# Patient Record
Sex: Female | Born: 1940
Health system: Southern US, Community
[De-identification: ages and names within clinical notes are randomized; demographics above are authoritative.]

## PROBLEM LIST (undated history)

## (undated) DIAGNOSIS — F32A Depression, unspecified: Secondary | ICD-10-CM

## (undated) DIAGNOSIS — E785 Hyperlipidemia, unspecified: Secondary | ICD-10-CM

## (undated) DIAGNOSIS — F419 Anxiety disorder, unspecified: Secondary | ICD-10-CM

## (undated) DIAGNOSIS — G250 Essential tremor: Secondary | ICD-10-CM

## (undated) DIAGNOSIS — I4891 Unspecified atrial fibrillation: Secondary | ICD-10-CM

## (undated) DIAGNOSIS — F329 Major depressive disorder, single episode, unspecified: Secondary | ICD-10-CM

## (undated) HISTORY — DX: Essential tremor: G25.0

## (undated) HISTORY — PX: APPENDECTOMY: SHX54

## (undated) HISTORY — DX: Hyperlipidemia, unspecified: E78.5

## (undated) HISTORY — PX: OTHER SURGICAL HISTORY: SHX169

## (undated) HISTORY — PX: TONSILLECTOMY: SUR1361

## (undated) HISTORY — PX: FL INJ LEFT KNEE CT ARTHROGRAM (ARMC HX): HXRAD1307

## (undated) HISTORY — PX: ABDOMINAL HYSTERECTOMY: SHX81

---

## 1997-11-11 ENCOUNTER — Ambulatory Visit (HOSPITAL_COMMUNITY): Admission: RE | Admit: 1997-11-11 | Discharge: 1997-11-11 | Payer: Self-pay | Admitting: *Deleted

## 1998-02-10 ENCOUNTER — Ambulatory Visit (HOSPITAL_COMMUNITY): Admission: RE | Admit: 1998-02-10 | Discharge: 1998-02-10 | Payer: Self-pay | Admitting: Urology

## 1998-07-28 ENCOUNTER — Ambulatory Visit (HOSPITAL_COMMUNITY): Admission: RE | Admit: 1998-07-28 | Discharge: 1998-07-28 | Payer: Self-pay | Admitting: *Deleted

## 1998-08-10 ENCOUNTER — Ambulatory Visit (HOSPITAL_COMMUNITY): Admission: RE | Admit: 1998-08-10 | Discharge: 1998-08-10 | Payer: Self-pay | Admitting: *Deleted

## 1999-04-07 ENCOUNTER — Ambulatory Visit (HOSPITAL_COMMUNITY): Admission: RE | Admit: 1999-04-07 | Discharge: 1999-04-07 | Payer: Self-pay | Admitting: *Deleted

## 1999-08-20 ENCOUNTER — Ambulatory Visit (HOSPITAL_COMMUNITY): Admission: RE | Admit: 1999-08-20 | Discharge: 1999-08-20 | Payer: Self-pay | Admitting: Family Medicine

## 2000-02-24 ENCOUNTER — Encounter: Admission: RE | Admit: 2000-02-24 | Discharge: 2000-02-24 | Payer: Self-pay | Admitting: *Deleted

## 2002-03-29 ENCOUNTER — Emergency Department (HOSPITAL_COMMUNITY): Admission: EM | Admit: 2002-03-29 | Discharge: 2002-03-29 | Payer: Self-pay | Admitting: Emergency Medicine

## 2002-03-29 ENCOUNTER — Encounter: Payer: Self-pay | Admitting: Emergency Medicine

## 2003-02-25 ENCOUNTER — Encounter: Payer: Self-pay | Admitting: Family Medicine

## 2003-02-25 ENCOUNTER — Encounter: Admission: RE | Admit: 2003-02-25 | Discharge: 2003-02-25 | Payer: Self-pay | Admitting: Family Medicine

## 2004-04-04 ENCOUNTER — Ambulatory Visit (HOSPITAL_COMMUNITY): Admission: RE | Admit: 2004-04-04 | Discharge: 2004-04-04 | Payer: Self-pay | Admitting: Family Medicine

## 2004-06-14 ENCOUNTER — Ambulatory Visit: Payer: Self-pay | Admitting: Family Medicine

## 2004-06-15 ENCOUNTER — Encounter: Admission: RE | Admit: 2004-06-15 | Discharge: 2004-06-15 | Payer: Self-pay | Admitting: Family Medicine

## 2004-06-17 ENCOUNTER — Ambulatory Visit: Payer: Self-pay | Admitting: Pulmonary Disease

## 2004-06-17 ENCOUNTER — Ambulatory Visit: Payer: Self-pay | Admitting: Internal Medicine

## 2004-06-17 ENCOUNTER — Inpatient Hospital Stay (HOSPITAL_COMMUNITY): Admission: AD | Admit: 2004-06-17 | Discharge: 2004-06-23 | Payer: Self-pay | Admitting: Family Medicine

## 2004-06-17 ENCOUNTER — Ambulatory Visit: Payer: Self-pay | Admitting: Family Medicine

## 2004-06-21 ENCOUNTER — Ambulatory Visit: Payer: Self-pay | Admitting: Family Medicine

## 2004-06-27 ENCOUNTER — Ambulatory Visit: Payer: Self-pay | Admitting: Family Medicine

## 2004-07-07 ENCOUNTER — Ambulatory Visit: Payer: Self-pay | Admitting: Pulmonary Disease

## 2004-07-20 ENCOUNTER — Emergency Department (HOSPITAL_COMMUNITY): Admission: EM | Admit: 2004-07-20 | Discharge: 2004-07-20 | Payer: Self-pay | Admitting: Emergency Medicine

## 2004-07-21 ENCOUNTER — Ambulatory Visit: Payer: Self-pay | Admitting: Internal Medicine

## 2004-07-21 ENCOUNTER — Ambulatory Visit: Payer: Self-pay

## 2004-07-21 ENCOUNTER — Ambulatory Visit: Payer: Self-pay | Admitting: Family Medicine

## 2004-07-28 ENCOUNTER — Ambulatory Visit: Payer: Self-pay | Admitting: Internal Medicine

## 2004-08-30 ENCOUNTER — Emergency Department (HOSPITAL_COMMUNITY): Admission: EM | Admit: 2004-08-30 | Discharge: 2004-08-30 | Payer: Self-pay | Admitting: *Deleted

## 2004-09-21 ENCOUNTER — Ambulatory Visit: Payer: Self-pay | Admitting: Internal Medicine

## 2004-10-06 ENCOUNTER — Ambulatory Visit: Payer: Self-pay | Admitting: Internal Medicine

## 2004-10-24 ENCOUNTER — Encounter (INDEPENDENT_AMBULATORY_CARE_PROVIDER_SITE_OTHER): Payer: Self-pay | Admitting: Specialist

## 2004-10-24 ENCOUNTER — Ambulatory Visit: Payer: Self-pay | Admitting: Internal Medicine

## 2004-11-24 ENCOUNTER — Ambulatory Visit: Payer: Self-pay | Admitting: Internal Medicine

## 2004-12-14 ENCOUNTER — Ambulatory Visit: Admission: RE | Admit: 2004-12-14 | Discharge: 2004-12-14 | Payer: Self-pay | Admitting: Internal Medicine

## 2004-12-16 ENCOUNTER — Ambulatory Visit: Payer: Self-pay | Admitting: Internal Medicine

## 2004-12-16 ENCOUNTER — Ambulatory Visit (HOSPITAL_COMMUNITY): Admission: RE | Admit: 2004-12-16 | Discharge: 2004-12-16 | Payer: Self-pay | Admitting: Internal Medicine

## 2005-01-17 ENCOUNTER — Ambulatory Visit: Payer: Self-pay | Admitting: Internal Medicine

## 2005-02-21 ENCOUNTER — Ambulatory Visit: Payer: Self-pay | Admitting: Internal Medicine

## 2005-03-15 ENCOUNTER — Ambulatory Visit: Payer: Self-pay | Admitting: Family Medicine

## 2005-03-22 ENCOUNTER — Ambulatory Visit (HOSPITAL_COMMUNITY): Admission: RE | Admit: 2005-03-22 | Discharge: 2005-03-22 | Payer: Self-pay | Admitting: Internal Medicine

## 2005-04-11 ENCOUNTER — Ambulatory Visit: Payer: Self-pay | Admitting: Family Medicine

## 2005-04-25 ENCOUNTER — Ambulatory Visit: Payer: Self-pay | Admitting: Family Medicine

## 2005-05-22 ENCOUNTER — Ambulatory Visit (HOSPITAL_COMMUNITY): Admission: RE | Admit: 2005-05-22 | Discharge: 2005-05-22 | Payer: Self-pay | Admitting: Family Medicine

## 2005-05-23 ENCOUNTER — Ambulatory Visit: Payer: Self-pay | Admitting: Family Medicine

## 2005-06-06 ENCOUNTER — Ambulatory Visit: Payer: Self-pay

## 2005-06-26 ENCOUNTER — Inpatient Hospital Stay (HOSPITAL_COMMUNITY): Admission: EM | Admit: 2005-06-26 | Discharge: 2005-06-28 | Payer: Self-pay | Admitting: Emergency Medicine

## 2005-06-26 ENCOUNTER — Ambulatory Visit: Payer: Self-pay | Admitting: Sports Medicine

## 2005-06-26 ENCOUNTER — Ambulatory Visit: Payer: Self-pay | Admitting: Family Medicine

## 2005-07-03 ENCOUNTER — Ambulatory Visit: Payer: Self-pay | Admitting: Family Medicine

## 2005-08-15 ENCOUNTER — Ambulatory Visit: Payer: Self-pay | Admitting: Family Medicine

## 2005-08-25 ENCOUNTER — Ambulatory Visit: Payer: Self-pay | Admitting: Pulmonary Disease

## 2005-08-29 ENCOUNTER — Ambulatory Visit (HOSPITAL_COMMUNITY): Admission: RE | Admit: 2005-08-29 | Discharge: 2005-08-29 | Payer: Self-pay | Admitting: Pulmonary Disease

## 2005-08-30 ENCOUNTER — Ambulatory Visit (HOSPITAL_COMMUNITY): Admission: RE | Admit: 2005-08-30 | Discharge: 2005-08-30 | Payer: Self-pay | Admitting: Pulmonary Disease

## 2005-09-01 ENCOUNTER — Ambulatory Visit: Payer: Self-pay | Admitting: Family Medicine

## 2005-09-14 ENCOUNTER — Ambulatory Visit (HOSPITAL_COMMUNITY): Admission: RE | Admit: 2005-09-14 | Discharge: 2005-09-14 | Payer: Self-pay | Admitting: Pulmonary Disease

## 2005-09-26 ENCOUNTER — Ambulatory Visit: Payer: Self-pay | Admitting: Pulmonary Disease

## 2005-09-26 ENCOUNTER — Ambulatory Visit: Payer: Self-pay | Admitting: Family Medicine

## 2005-11-10 ENCOUNTER — Observation Stay (HOSPITAL_COMMUNITY): Admission: EM | Admit: 2005-11-10 | Discharge: 2005-11-12 | Payer: Self-pay | Admitting: Emergency Medicine

## 2005-11-10 ENCOUNTER — Ambulatory Visit: Payer: Self-pay | Admitting: Pulmonary Disease

## 2005-11-15 ENCOUNTER — Ambulatory Visit: Payer: Self-pay | Admitting: Pulmonary Disease

## 2005-11-16 ENCOUNTER — Emergency Department (HOSPITAL_COMMUNITY): Admission: EM | Admit: 2005-11-16 | Discharge: 2005-11-16 | Payer: Self-pay | Admitting: *Deleted

## 2005-11-17 ENCOUNTER — Ambulatory Visit: Payer: Self-pay | Admitting: Critical Care Medicine

## 2005-12-05 ENCOUNTER — Ambulatory Visit: Payer: Self-pay | Admitting: Family Medicine

## 2006-01-16 ENCOUNTER — Ambulatory Visit: Payer: Self-pay | Admitting: Pulmonary Disease

## 2006-02-02 ENCOUNTER — Ambulatory Visit: Payer: Self-pay | Admitting: Internal Medicine

## 2006-03-20 DIAGNOSIS — E785 Hyperlipidemia, unspecified: Secondary | ICD-10-CM | POA: Insufficient documentation

## 2006-03-20 DIAGNOSIS — E039 Hypothyroidism, unspecified: Secondary | ICD-10-CM | POA: Insufficient documentation

## 2006-03-20 DIAGNOSIS — Z7901 Long term (current) use of anticoagulants: Secondary | ICD-10-CM

## 2006-03-20 DIAGNOSIS — F339 Major depressive disorder, recurrent, unspecified: Secondary | ICD-10-CM

## 2006-03-20 DIAGNOSIS — F411 Generalized anxiety disorder: Secondary | ICD-10-CM

## 2006-03-20 DIAGNOSIS — E669 Obesity, unspecified: Secondary | ICD-10-CM

## 2006-03-20 HISTORY — DX: Major depressive disorder, recurrent, unspecified: F33.9

## 2006-03-20 HISTORY — DX: Generalized anxiety disorder: F41.1

## 2006-03-20 HISTORY — DX: Obesity, unspecified: E66.9

## 2006-03-20 HISTORY — DX: Long term (current) use of anticoagulants: Z79.01

## 2006-03-29 ENCOUNTER — Ambulatory Visit: Payer: Self-pay | Admitting: Family Medicine

## 2006-03-29 ENCOUNTER — Encounter: Payer: Self-pay | Admitting: Family Medicine

## 2006-05-23 ENCOUNTER — Encounter: Payer: Self-pay | Admitting: Family Medicine

## 2006-05-24 ENCOUNTER — Ambulatory Visit: Payer: Self-pay | Admitting: Family Medicine

## 2006-06-13 LAB — CONVERTED CEMR LAB
Total CHOL/HDL Ratio: 6.1
VLDL: 58 mg/dL — ABNORMAL HIGH (ref 0–40)

## 2006-07-27 ENCOUNTER — Ambulatory Visit: Payer: Self-pay | Admitting: Family Medicine

## 2006-08-10 ENCOUNTER — Ambulatory Visit: Payer: Self-pay | Admitting: Family Medicine

## 2006-09-04 ENCOUNTER — Ambulatory Visit: Payer: Self-pay | Admitting: Family Medicine

## 2006-09-21 ENCOUNTER — Ambulatory Visit: Payer: Self-pay | Admitting: Family Medicine

## 2006-10-12 ENCOUNTER — Encounter: Admission: RE | Admit: 2006-10-12 | Discharge: 2006-10-12 | Payer: Self-pay | Admitting: Family Medicine

## 2006-10-15 ENCOUNTER — Telehealth (INDEPENDENT_AMBULATORY_CARE_PROVIDER_SITE_OTHER): Payer: Self-pay | Admitting: *Deleted

## 2006-10-19 ENCOUNTER — Telehealth: Payer: Self-pay | Admitting: Family Medicine

## 2006-10-25 DIAGNOSIS — M949 Disorder of cartilage, unspecified: Secondary | ICD-10-CM

## 2006-10-25 DIAGNOSIS — M899 Disorder of bone, unspecified: Secondary | ICD-10-CM | POA: Insufficient documentation

## 2006-11-07 ENCOUNTER — Encounter: Payer: Self-pay | Admitting: Family Medicine

## 2007-01-15 ENCOUNTER — Ambulatory Visit: Payer: Self-pay | Admitting: Family Medicine

## 2007-01-15 DIAGNOSIS — L570 Actinic keratosis: Secondary | ICD-10-CM

## 2007-01-15 HISTORY — DX: Actinic keratosis: L57.0

## 2007-01-16 ENCOUNTER — Encounter: Payer: Self-pay | Admitting: Family Medicine

## 2007-01-17 ENCOUNTER — Telehealth: Payer: Self-pay | Admitting: Family Medicine

## 2007-02-13 ENCOUNTER — Encounter: Payer: Self-pay | Admitting: Family Medicine

## 2007-02-13 ENCOUNTER — Telehealth: Payer: Self-pay | Admitting: Family Medicine

## 2007-03-01 ENCOUNTER — Encounter: Payer: Self-pay | Admitting: Family Medicine

## 2007-03-06 ENCOUNTER — Encounter: Payer: Self-pay | Admitting: Family Medicine

## 2007-04-17 ENCOUNTER — Telehealth: Payer: Self-pay | Admitting: Family Medicine

## 2007-04-22 ENCOUNTER — Telehealth: Payer: Self-pay | Admitting: Family Medicine

## 2007-04-30 ENCOUNTER — Encounter: Payer: Self-pay | Admitting: Family Medicine

## 2007-05-01 ENCOUNTER — Ambulatory Visit: Payer: Self-pay | Admitting: Family Medicine

## 2007-05-06 LAB — CONVERTED CEMR LAB
ALT: 20 units/L (ref 0–35)
AST: 15 units/L (ref 0–37)
Albumin: 4.5 g/dL (ref 3.5–5.2)
Alkaline Phosphatase: 84 units/L (ref 39–117)
Calcium: 9.2 mg/dL (ref 8.4–10.5)
Chloride: 108 meq/L (ref 96–112)
Potassium: 4.6 meq/L (ref 3.5–5.3)
Sodium: 143 meq/L (ref 135–145)
Total Protein: 7.3 g/dL (ref 6.0–8.3)

## 2007-05-24 ENCOUNTER — Encounter: Payer: Self-pay | Admitting: Family Medicine

## 2007-05-29 ENCOUNTER — Telehealth: Payer: Self-pay | Admitting: Family Medicine

## 2007-06-03 ENCOUNTER — Telehealth: Payer: Self-pay | Admitting: Family Medicine

## 2007-07-08 ENCOUNTER — Telehealth: Payer: Self-pay | Admitting: Family Medicine

## 2007-07-08 ENCOUNTER — Ambulatory Visit: Payer: Self-pay | Admitting: Family Medicine

## 2007-07-08 DIAGNOSIS — G4700 Insomnia, unspecified: Secondary | ICD-10-CM

## 2007-07-08 HISTORY — DX: Insomnia, unspecified: G47.00

## 2007-07-09 LAB — CONVERTED CEMR LAB
Basophils Absolute: 0 10*3/uL (ref 0.0–0.1)
Basophils Relative: 1 % (ref 0–1)
Eosinophils Absolute: 0.1 10*3/uL (ref 0.0–0.7)
MCHC: 31.9 g/dL (ref 30.0–36.0)
MCV: 92.7 fL (ref 78.0–100.0)
Monocytes Relative: 6 % (ref 3–12)
Neutrophils Relative %: 68 % (ref 43–77)
Platelets: 261 10*3/uL (ref 150–400)
RDW: 14.2 % (ref 11.5–15.5)
Sed Rate: 7 mm/hr (ref 0–22)
TSH: 1.442 microintl units/mL (ref 0.350–5.50)

## 2007-07-26 ENCOUNTER — Telehealth: Payer: Self-pay | Admitting: Family Medicine

## 2007-09-02 ENCOUNTER — Telehealth: Payer: Self-pay | Admitting: Family Medicine

## 2007-10-15 ENCOUNTER — Ambulatory Visit: Payer: Self-pay | Admitting: Family Medicine

## 2007-10-17 ENCOUNTER — Ambulatory Visit: Payer: Self-pay | Admitting: Family Medicine

## 2007-10-17 ENCOUNTER — Encounter: Admission: RE | Admit: 2007-10-17 | Discharge: 2007-10-17 | Payer: Self-pay | Admitting: Family Medicine

## 2007-10-17 LAB — CONVERTED CEMR LAB
Total CHOL/HDL Ratio: 4.5
Vit D, 1,25-Dihydroxy: 25 — ABNORMAL LOW (ref 30–89)

## 2007-10-22 ENCOUNTER — Encounter: Payer: Self-pay | Admitting: Family Medicine

## 2007-11-19 ENCOUNTER — Encounter: Payer: Self-pay | Admitting: Family Medicine

## 2008-03-17 ENCOUNTER — Ambulatory Visit: Payer: Self-pay | Admitting: Family Medicine

## 2008-03-31 ENCOUNTER — Encounter: Payer: Self-pay | Admitting: Family Medicine

## 2008-03-31 ENCOUNTER — Telehealth: Payer: Self-pay | Admitting: Family Medicine

## 2008-04-01 ENCOUNTER — Ambulatory Visit: Payer: Self-pay | Admitting: Family Medicine

## 2008-04-03 ENCOUNTER — Telehealth: Payer: Self-pay | Admitting: Family Medicine

## 2008-04-24 ENCOUNTER — Encounter: Payer: Self-pay | Admitting: Family Medicine

## 2008-04-24 ENCOUNTER — Telehealth: Payer: Self-pay | Admitting: Family Medicine

## 2008-06-01 ENCOUNTER — Telehealth: Payer: Self-pay | Admitting: Family Medicine

## 2008-07-21 ENCOUNTER — Ambulatory Visit: Payer: Self-pay | Admitting: Family Medicine

## 2008-07-21 DIAGNOSIS — G459 Transient cerebral ischemic attack, unspecified: Secondary | ICD-10-CM | POA: Insufficient documentation

## 2008-07-21 DIAGNOSIS — R259 Unspecified abnormal involuntary movements: Secondary | ICD-10-CM | POA: Insufficient documentation

## 2008-07-21 HISTORY — DX: Transient cerebral ischemic attack, unspecified: G45.9

## 2008-07-21 LAB — CONVERTED CEMR LAB
Glucose, Urine, Semiquant: NEGATIVE
Specific Gravity, Urine: 1.025
pH: 5.5

## 2008-07-24 LAB — CONVERTED CEMR LAB
ALT: 12 units/L (ref 0–35)
Basophils Absolute: 0 10*3/uL (ref 0.0–0.1)
CO2: 18 meq/L — ABNORMAL LOW (ref 19–32)
Calcium: 10 mg/dL (ref 8.4–10.5)
Chloride: 106 meq/L (ref 96–112)
Hemoglobin: 15.9 g/dL — ABNORMAL HIGH (ref 12.0–15.0)
Lymphocytes Relative: 24 % (ref 12–46)
Neutro Abs: 5 10*3/uL (ref 1.7–7.7)
Platelets: 286 10*3/uL (ref 150–400)
RDW: 14.2 % (ref 11.5–15.5)
Sodium: 142 meq/L (ref 135–145)
TSH: 2.478 microintl units/mL (ref 0.350–4.50)
Total Protein: 7.5 g/dL (ref 6.0–8.3)

## 2008-07-29 ENCOUNTER — Telehealth: Payer: Self-pay | Admitting: Family Medicine

## 2008-09-18 ENCOUNTER — Ambulatory Visit: Payer: Self-pay | Admitting: Family Medicine

## 2008-09-19 ENCOUNTER — Encounter: Payer: Self-pay | Admitting: Family Medicine

## 2008-10-05 ENCOUNTER — Ambulatory Visit: Payer: Self-pay | Admitting: Family Medicine

## 2008-10-08 ENCOUNTER — Telehealth: Payer: Self-pay | Admitting: Family Medicine

## 2008-10-30 ENCOUNTER — Telehealth: Payer: Self-pay | Admitting: Family Medicine

## 2008-12-03 ENCOUNTER — Encounter: Admission: RE | Admit: 2008-12-03 | Discharge: 2008-12-03 | Payer: Self-pay | Admitting: Family Medicine

## 2008-12-03 ENCOUNTER — Ambulatory Visit: Payer: Self-pay | Admitting: Family Medicine

## 2008-12-09 ENCOUNTER — Telehealth: Payer: Self-pay | Admitting: Family Medicine

## 2009-01-28 ENCOUNTER — Encounter: Payer: Self-pay | Admitting: Family Medicine

## 2009-02-16 ENCOUNTER — Telehealth (INDEPENDENT_AMBULATORY_CARE_PROVIDER_SITE_OTHER): Payer: Self-pay | Admitting: *Deleted

## 2009-03-03 ENCOUNTER — Ambulatory Visit: Payer: Self-pay | Admitting: Family Medicine

## 2009-03-03 ENCOUNTER — Encounter: Admission: RE | Admit: 2009-03-03 | Discharge: 2009-03-03 | Payer: Self-pay | Admitting: Family Medicine

## 2009-03-30 ENCOUNTER — Ambulatory Visit: Payer: Self-pay | Admitting: Family Medicine

## 2009-03-31 LAB — CONVERTED CEMR LAB
Albumin: 4 g/dL (ref 3.5–5.2)
Alkaline Phosphatase: 80 units/L (ref 39–117)
BUN: 17 mg/dL (ref 6–23)
Glucose, Bld: 112 mg/dL — ABNORMAL HIGH (ref 70–99)
HDL: 48 mg/dL (ref >39)
LDL Cholesterol: 133 mg/dL — ABNORMAL HIGH (ref 0–99)
Total Bilirubin: 0.5 mg/dL (ref 0.3–1.2)
Triglycerides: 197 mg/dL — ABNORMAL HIGH (ref <150)
VLDL: 39 mg/dL (ref 0–40)

## 2009-04-23 DIAGNOSIS — E119 Type 2 diabetes mellitus without complications: Secondary | ICD-10-CM

## 2009-04-23 DIAGNOSIS — K219 Gastro-esophageal reflux disease without esophagitis: Secondary | ICD-10-CM

## 2009-04-23 DIAGNOSIS — R0602 Shortness of breath: Secondary | ICD-10-CM

## 2009-04-23 DIAGNOSIS — N259 Disorder resulting from impaired renal tubular function, unspecified: Secondary | ICD-10-CM | POA: Insufficient documentation

## 2009-04-23 HISTORY — DX: Gastro-esophageal reflux disease without esophagitis: K21.9

## 2009-04-23 HISTORY — DX: Shortness of breath: R06.02

## 2009-04-28 ENCOUNTER — Ambulatory Visit: Payer: Self-pay | Admitting: Family Medicine

## 2009-04-28 DIAGNOSIS — N390 Urinary tract infection, site not specified: Secondary | ICD-10-CM

## 2009-04-28 HISTORY — DX: Urinary tract infection, site not specified: N39.0

## 2009-04-28 LAB — CONVERTED CEMR LAB
Blood in Urine, dipstick: NEGATIVE
Glucose, Urine, Semiquant: 100
Nitrite: POSITIVE

## 2010-01-12 ENCOUNTER — Ambulatory Visit: Payer: Self-pay | Admitting: Family Medicine

## 2010-01-13 ENCOUNTER — Encounter: Payer: Self-pay | Admitting: Family Medicine

## 2010-02-15 ENCOUNTER — Telehealth: Payer: Self-pay | Admitting: Family Medicine

## 2010-05-18 ENCOUNTER — Ambulatory Visit: Payer: Self-pay | Admitting: Family Medicine

## 2010-05-18 ENCOUNTER — Encounter: Payer: Self-pay | Admitting: Family Medicine

## 2010-05-19 LAB — CONVERTED CEMR LAB
ALT: 20 U/L
AST: 19 U/L
Albumin: 4.4 g/dL
Alkaline Phosphatase: 77 U/L
BUN: 21 mg/dL
CO2: 24 meq/L
Calcium: 9.1 mg/dL
Chloride: 105 meq/L
Cholesterol: 229 mg/dL — ABNORMAL HIGH
Creatinine, Ser: 1.06 mg/dL
Glucose, Bld: 100 mg/dL — ABNORMAL HIGH
HCT: 44 %
HDL: 47 mg/dL
Hemoglobin: 14.6 g/dL
Hgb A1c MFr Bld: 5.7 % — ABNORMAL HIGH
LDL Cholesterol: 152 mg/dL — ABNORMAL HIGH
MCHC: 33.2 g/dL
MCV: 89.2 fL
Platelets: 253 10*3/uL
Potassium: 4.3 meq/L
RBC: 4.93 M/uL
RDW: 13.9 %
Sodium: 140 meq/L
TSH: 3.792 u[IU]/mL
Total Bilirubin: 0.8 mg/dL
Total CHOL/HDL Ratio: 4.9
Total Protein: 6.8 g/dL
Triglycerides: 152 mg/dL — ABNORMAL HIGH
VLDL: 30 mg/dL
Vit D, 25-Hydroxy: 35 ng/mL
WBC: 5.7 10*3/uL

## 2010-05-20 ENCOUNTER — Encounter (INDEPENDENT_AMBULATORY_CARE_PROVIDER_SITE_OTHER): Payer: Self-pay | Admitting: *Deleted

## 2010-06-27 ENCOUNTER — Ambulatory Visit
Admission: RE | Admit: 2010-06-27 | Discharge: 2010-06-27 | Payer: Self-pay | Source: Home / Self Care | Attending: Family Medicine | Admitting: Family Medicine

## 2010-06-27 ENCOUNTER — Encounter: Payer: Self-pay | Admitting: Family Medicine

## 2010-06-27 DIAGNOSIS — H6690 Otitis media, unspecified, unspecified ear: Secondary | ICD-10-CM | POA: Insufficient documentation

## 2010-06-27 DIAGNOSIS — J209 Acute bronchitis, unspecified: Secondary | ICD-10-CM | POA: Insufficient documentation

## 2010-06-27 HISTORY — DX: Acute bronchitis, unspecified: J20.9

## 2010-07-01 ENCOUNTER — Ambulatory Visit
Admission: RE | Admit: 2010-07-01 | Discharge: 2010-07-01 | Payer: Self-pay | Source: Home / Self Care | Attending: Family Medicine | Admitting: Family Medicine

## 2010-07-01 ENCOUNTER — Encounter
Admission: RE | Admit: 2010-07-01 | Discharge: 2010-07-01 | Payer: Self-pay | Source: Home / Self Care | Attending: Family Medicine | Admitting: Family Medicine

## 2010-07-02 ENCOUNTER — Encounter: Payer: Self-pay | Admitting: Internal Medicine

## 2010-07-03 ENCOUNTER — Encounter: Payer: Self-pay | Admitting: Family Medicine

## 2010-07-13 ENCOUNTER — Ambulatory Visit: Payer: Self-pay | Admitting: Family Medicine

## 2010-07-13 ENCOUNTER — Ambulatory Visit: Admit: 2010-07-13 | Payer: Self-pay | Admitting: Family Medicine

## 2010-07-14 NOTE — Assessment & Plan Note (Signed)
Summary: ear pain   Vital Signs:  Patient profile:   70 year old female Height:      67.5 inches Weight:      210 pounds BMI:     32.52 O2 Sat:      96 % on Room air Temp:     98.5 degrees F oral Pulse rate:   102 / minute BP sitting:   129 / 72  (left arm) Cuff size:   large  Vitals Entered By: Payton Spark CMA (July 01, 2010 2:04 PM)  O2 Flow:  Room air CC: Ears are getting worse.   Primary Care Provider:  Seymour Bars DO  CC:  Ears are getting worse.Marland Kitchen  History of Present Illness: 70 yo WF presents for continued pain in the L ear with muffled hearing.  She has had drainage from the ear and was diagnosed with a perf, on day 3/10 of Ery-tab.  She continues to have a harsh cough.  She is taking ibuprofen 800 mg 3 x a day but it seems to not be helping anymore.  She has had subjective fevers and sweats.  Her cough is unchanged.  She is flying on Monday to [facility] and is worried about her ear pain.  She is not dizzy, is not producing anything w/ her cough, is not SOB but the Hycodan is not helping.    Current Medications (verified): 1)  Xanax 0.5 Mg Tabs (Alprazolam) .Marland Kitchen.. 1 Tab By Mouth Two Times A Day As Needed Anxiety 2)  Fish Oil 1000 Mg Caps (Omega-3 Fatty Acids) .Marland Kitchen.. 1 Capsule By Mouth Tid 3)  Lovastatin 40 Mg Tabs (Lovastatin) .... Take 2 Tabs By Mouth At Bedtime 4)  Mirtazapine 30 Mg Tabs (Mirtazapine) .Marland Kitchen.. 1 Tab By Mouth Qhs 5)  Ery-Tab 250 Mg Tbec (Erythromycin Base) .Marland Kitchen.. 1 Tab By Mouth Q 6 Hrs X 10 Days 6)  Hydrocodone-Homatropine 5-1.5 Mg/2ml Syrp (Hydrocodone-Homatropine) .... 5 Ml By Mouth Q 6 Hrs As Needed Cough  Allergies (verified): 1)  ! Compazine 2)  ! Cephalosporins 3)  ! Pcn  Past History:  Past Medical History: Reviewed history from 04/23/2009 and no changes required. 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 breast biopsies- fibrocystic breast dz,  cardiac ablation,  Cardiac Cath-- nml,  EKG- freq PVCs, coupling, PACs,  hysterectomy for DUB,  L inguinal hernia repair  MRI abd- liver hemanioma, parapelvic renal cysts, stress test- no ischemia, poor exercise tolearance 2005 ([facility]) Tonsillectomy Bronchoscopy, ph probe 11-07  Social History: Reviewed history from 04/28/2009 and no changes required. 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  Has 2 grown children (one adopted) and 7 grandchildren.  Lives alone.  Nonsmoker, denies ETOH, does not exercise.  Review of Systems      See HPI  Physical Exam  General:  alert, well-developed, well-nourished, and well-hydrated.   Head:  normocephalic and atraumatic.   Eyes:  conjunctiva clear Ears:  R TM is normal; L TM no longer appears purulent but has dried blood covering the  bony landmarks, ? perforated Nose:  clear rhinorrhea Mouth:  cobbestoning with clear drip Neck:  no masses.   Lungs:  Normal respiratory effort, chest expands symmetrically. Lungs are clear to auscultation, no crackles or wheezes.  dry hacking cough Heart:  Normal rate and regular rhythm. S1 and S2 normal without gallop, murmur, click, rub or other extra sounds. Neurologic:  tresting head tremor Skin:  color normal.     Impression & Recommendations:  Problem # 1:  LOM (ICD-382.9) She is on Day 3-4/10 on ERY-tab and actually her TM has  improved some.  Wil continue this and treat the pain associated with AOM with Vicodin given upcoming flight.  Recheck when she returns.  Refer to ENT if not resolving. Her updated medication list for this problem includes:    Ery-tab 250 Mg Tbec (Erythromycin base) .Marland Kitchen... 1 tab by mouth q 6 hrs x 10 days  Problem # 2:  ACUTE BRONCHITIS (ICD-466.0) Prolonged harsh cough.  Will get a CXR today to r/o TB/ PNA (she was an ER nurse for years). Will continue current meds if normal.   The following medications were removed from the medication list:    Hydrocodone-homatropine 5-1.5 Mg/16ml Syrp (Hydrocodone-homatropine) .Marland KitchenMarland KitchenMarland KitchenMarland Kitchen 5 ml by mouth q 6 hrs as needed cough Her updated medication list for this problem includes:    Ery-tab 250 Mg Tbec (Erythromycin base) .Marland Kitchen... 1 tab by mouth q 6 hrs x 10 days  Orders: T-Chest x-ray, 2 views (71020)  Complete Medication List: 1)  Xanax 0.5 Mg Tabs (Alprazolam) .Marland Kitchen.. 1 tab by mouth two times a day as needed anxiety 2)  Fish Oil 1000 Mg Caps (Omega-3 fatty acids) .Marland Kitchen.. 1 capsule by mouth tid 3)  Lovastatin 40 Mg Tabs (Lovastatin) .... Take 2 tabs by mouth at bedtime 4)  Mirtazapine 30 Mg Tabs (Mirtazapine) .Marland Kitchen.. 1 tab by mouth qhs 5)  Ery-tab 250 Mg Tbec (Erythromycin base) .Marland Kitchen.. 1 tab by mouth q 6 hrs x 10 days 6)  Hydrocodone-acetaminophen 5-500 Mg Tabs (Hydrocodone-acetaminophen) .Marland Kitchen.. 1-2 tabs by mouth q 6 hrs as needed pain/ cough 7)  Prilosec Otc 20 Mg Tbec (Omeprazole magnesium) .... 2 tabs by mouth once daily  Patient Instructions: 1)  CXR today. 2)  Will call you tonight with results. 3)  Stay on ERY-TAB. 4)  Change out Hycodan for RX Hydrocodone tabs -- this will help both cough and ear pain. Prescriptions: HYDROCODONE-ACETAMINOPHEN 5-500 MG TABS (HYDROCODONE-ACETAMINOPHEN) 1-2 tabs by mouth q 6 hrs as needed pain/ cough  #30 x 0   Entered and Authorized by:   Seymour Bars DO   Signed by:   Seymour Bars DO on 07/01/2010   Method used:   Printed then faxed  to ...       [facility] [facility] (retail)       [address]       [place], [place]  [address]       Ph: [phone]       Fax: [phone]   RxID:   [id number]    Orders Added: 1)  T-Chest x-ray, 2 views [71020] 2)  Est. Patient Level III [84132]  Appended Document: ear pain Pls have pt take OTC Prilosec 2 tabs once daily until I see her back.  Her reflux is likely to be contributing to this cough.  Seymour Bars, D.O.  Appended Document: ear pain LMOM informing Pt of the above

## 2010-07-14 NOTE — Letter (Signed)
Summary: Primary Care Consult Scheduled Letter  [facility] [place]  [address]   [place], [place] [address]   Phone: [phone]  Fax: [phone]      05/20/2010 MRN: [id number]  [name] Brown [address] [place], [place]  [address]    Dear Ms. Halpin,     We are unable to reach you by phone because we need a  updated phone number for you.  We have scheduled an appointment for you.  At the recommendation of Dr.Bowen, we have scheduled you a consult with [facility] on Thursday 06/16/10 at 10:15.  Their address is [address] [place] [address]. The office phone number is [phone].  If this appointment day and time is not convenient for you, please feel free to call the office of the doctor you are being referred to at the number listed above and reschedule the appointment.     It is important for you to keep your scheduled appointments. We are here to make sure you are given good patient care.    Thank you, Michaelle Copas [phone] Patient Care Coordinator [facility] Kathryne Sharper

## 2010-07-14 NOTE — Assessment & Plan Note (Signed)
Summary: UTI   Vital Signs:  Patient profile:   70 year old female Height:      67.5 inches Weight:      216 pounds BMI:     33.45 O2 Sat:      96 % on Room air Temp:     98.4 degrees F oral Pulse rate:   77 / minute BP sitting:   136 / 77  (left arm) Cuff size:   large  Vitals Entered By: Payton Spark CMA (January 12, 2010 8:53 AM)  O2 Flow:  Room air CC: ? UTI.   Primary Care Provider:  Seymour Bars DO  CC:  ? UTI.Marland Kitchen  History of Present Illness: [name] Brown presents for UTI symptoms that started on Saturday.  She was out of town, so only AZO OTC.  She is having HAs, tired, dysuria, frequency, urgency.  Denies fevers or chills.  Has some suprabic pain.  Denies LBP or flank pain.  Has some nausea, no vomitting.  Denies any gross hematuria.  AZO did help some.    Allergies: 1)  ! Compazine 2)  ! Cephalosporins 3)  ! Pcn  Past History:  Past Medical History: Reviewed history from 04/23/2009 and no changes required. Current Problems:  TRANSIENT ISCHEMIC ATTACK (ICD-435.9) HYPERLIPIDEMIA (ICD-272.4) GERD (ICD-530.81) DM (ICD-250.00) RENAL INSUFFICIENCY (ICD-588.9) DYSPNEA (ICD-786.05) HEALTH MAINTENANCE EXAM (ICD-V70.0) RESTING TREMOR (ICD-781.0) INSOMNIA, CHRONIC (ICD-307.42) ACTINIC KERATOSIS (ICD-702.0) OSTEOPENIA (ICD-733.90) OBESITY, NOS (ICD-278.00) HYPOTHYROIDISM, UNSPECIFIED (ICD-244.9) DEPRESSION, MAJOR, RECURRENT (ICD-296.30) ANXIETY (ICD-300.00) cardia arrythmia,  accessory tract,  G1P1001 Left calf path: SCC in situ  resting tremors - head L vocal cord dysfunction ?TIA -- seeing Dr Gaetano Net  Social History: Reviewed history from 04/28/2009 and no changes required. 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  Has 2 grown children (one adopted) and 7 grandchildren.  Lives alone.  Nonsmoker, denies ETOH, does not exercise.  Review of Systems      See HPI  Physical Exam  General:  alert, well-developed, well-nourished,  well-hydrated, and overweight-appearing.   Head:  normocephalic and atraumatic.   Eyes:  sclera non icteric Mouth:  pharynx pink and moist.   Neck:  no masses.   Lungs:  Normal respiratory effort, chest expands symmetrically. Lungs are clear to auscultation, no crackles or wheezes. Heart:  Normal rate and regular rhythm. S1 and S2 normal without gallop, murmur, click, rub or other extra sounds. Abdomen:  suprapbuic TTP, no CVAT, soft. Extremities:  trace bilat LE edema Skin:  color normal.   Psych:  good eye contact, not anxious appearing, and not depressed appearing.     Impression & Recommendations:  Problem # 1:  UTI (ICD-599.0) UA grossly + for infection.  Sent for cx to make sure it's not resistant to Cipro.  Start Cipro + Pyridium.  Increase water intake.  Bland diet, rest.   The following medications were removed from the medication list:    Ciprofloxacin Hcl 500 Mg Tabs (Ciprofloxacin hcl) .Marland Kitchen... 1 tab by mouth q 12 hrs x 3 days Her updated medication list for this problem includes:    Pyridium 200 Mg Tabs (Phenazopyridine hcl) .Marland Kitchen... 1 tab by mouth three times a day x 2 days    Ciprofloxacin Hcl 500 Mg Tabs (Ciprofloxacin hcl) .Marland Kitchen... 1 tab by mouth q 12 hrs x 3 days  Orders: UA Dipstick w/o Micro (automated)  (81003) T-Culture, Urine ([address])  Complete Medication List: 1)  Calcarb 600/d 600-125 Mg-unit Tabs (Calcium-vitamin d) .Marland KitchenMarland KitchenMarland Kitchen  1 tab by mouth two times a day ac 2)  Inderal 40 Mg Tabs (Propranolol hcl) .Marland Kitchen.. 1 tab by mouth once daily 3)  Synthroid 100 Mcg Tabs (Levothyroxine sodium) .Marland Kitchen.. 1 tab by mouth qd 4)  Xanax 0.5 Mg Tabs (Alprazolam) .Marland Kitchen.. 1 tab by mouth two times a day as needed anxiety 5)  Fish Oil 1000 Mg Caps (Omega-3 fatty acids) .Marland Kitchen.. 1 capsule by mouth tid 6)  Lovastatin 40 Mg Tabs (Lovastatin) .... 2 tabs by mouth at bedtime 7)  Vitamin D 50,000 Iu Capsules  .... Take 1 capsule by mouth q week 8)  Cymbalta 60 Mg Cpep (Duloxetine hcl) .Marland Kitchen.. 1 tab by mouth  daily 9)  Mirtazapine 30 Mg Tabs (Mirtazapine) .Marland Kitchen.. 1 tab by mouth qhs 10)  Ipratropium Bromide 0.03 % Soln (Ipratropium bromide) .... 2 sprays per nostril bid 11)  Pyridium 200 Mg Tabs (Phenazopyridine hcl) .Marland Kitchen.. 1 tab by mouth three times a day x 2 days 12)  Ciprofloxacin Hcl 500 Mg Tabs (Ciprofloxacin hcl) .Marland Kitchen.. 1 tab by mouth q 12 hrs x 3 days  Patient Instructions: 1)  Start on Cipro every 12 hrs for UTI. 2)  Will call you with urine culture results on Friday.   3)  Drink plenty of water. 4)  Take it easy. Prescriptions: PYRIDIUM 200 MG TABS (PHENAZOPYRIDINE HCL) 1 tab by mouth three times a day x 2 days  #6 tabs x 0   Entered and Authorized by:   Seymour Bars DO   Signed by:   Seymour Bars DO on 01/12/2010   Method used:   Electronically to        [facility]  [address] [id number] (retail)       [address] [place], [place]  [address]       Ph: [phone] or [phone]       Fax: [phone]   RxID:   [id number] CIPROFLOXACIN HCL 500 MG TABS (CIPROFLOXACIN HCL) 1 tab by mouth q 12 hrs x 3 days  #6 x 0   Entered and Authorized by:   Seymour Bars DO   Signed by:   Seymour Bars DO on 01/12/2010   Method used:   Electronically to        [facility]  [address] [id number] ([facility])       [address] [place], [place]  [address]       Ph: [phone] or [phone]       Fax: [phone]   RxID:   [id number]   Appended Document: UTI     Vitals Entered By: Payton Spark CMA (January 12, 2010 9:26 AM)  Allergies: 1)  ! Compazine 2)  ! Cephalosporins 3)  ! Pcn   Complete Medication List: 1)  Calcarb 600/d 600-125 Mg-unit Tabs (Calcium-vitamin d) .Marland Kitchen.. 1 tab by mouth two times a day ac 2)  Inderal 40 Mg Tabs (Propranolol hcl) .Marland Kitchen.. 1 tab by mouth once daily 3)  Synthroid 100 Mcg Tabs (Levothyroxine sodium) .Marland Kitchen.. 1 tab by mouth qd 4)  Xanax 0.5 Mg Tabs (Alprazolam) .Marland Kitchen.. 1 tab by mouth two times a day as needed anxiety 5)  Fish Oil 1000 Mg Caps (Omega-3  fatty acids) .Marland Kitchen.. 1 capsule by mouth tid 6)  Lovastatin 40 Mg Tabs (Lovastatin) .... 2 tabs by mouth at bedtime 7)  Vitamin D 50,000  Iu Capsules  .... Take 1 capsule by mouth q week 8)  Cymbalta 60 Mg Cpep (Duloxetine hcl) .Marland Kitchen.. 1 tab by mouth daily 9)  Mirtazapine 30 Mg Tabs (Mirtazapine) .Marland Kitchen.. 1 tab by mouth qhs 10)  Ipratropium Bromide 0.03 % Soln (Ipratropium bromide) .... 2 sprays per nostril bid 11)  Pyridium 200 Mg Tabs (Phenazopyridine hcl) .Marland Kitchen.. 1 tab by mouth three times a day x 2 days 12)  Ciprofloxacin Hcl 500 Mg Tabs (Ciprofloxacin hcl) .Marland Kitchen.. 1 tab by mouth q 12 hrs x 3 days   Laboratory Results   Urine Tests    Routine Urinalysis   Color: yellow Appearance: Clear Glucose: negative   (Normal Range: Negative) Bilirubin: small   (Normal Range: Negative) Ketone: negative   (Normal Range: Negative) Spec. Gravity: >=1.030   (Normal Range: 1.003-1.035) Blood: negative   (Normal Range: Negative) pH: 5.0   (Normal Range: 5.0-8.0) Protein: trace   (Normal Range: Negative) Urobilinogen: 0.2   (Normal Range: 0-1) Nitrite: positive   (Normal Range: Negative) Leukocyte Esterace: small   (Normal Range: Negative)

## 2010-07-14 NOTE — Progress Notes (Signed)
Summary: Xanax refill  Phone Note Refill Request   Refills Requested: Medication #1:  XANAX 0.5 MG TABS 1 tab by mouth two times a day as needed anxiety Initial call taken by: Payton Spark CMA,  February 15, 2010 1:22 PM    Prescriptions: XANAX 0.5 MG TABS (ALPRAZOLAM) 1 tab by mouth two times a day as needed anxiety  #180 x 0   Entered and Authorized by:   Seymour Bars DO   Signed by:   Seymour Bars DO on 02/15/2010   Method used:   Printed then faxed to ...       [facility]  [address] [phone][facility] (retail[facility]       [address]       [place], [place]  [address]       Ph: [phone] or [phone]       Fax: [phone]   RxID:   [id number]

## 2010-07-14 NOTE — Assessment & Plan Note (Signed)
Summary: CPE w/o pap   Vital Signs:  Patient profile:   70 year old female Height:      67.5 inches Weight:      212 pounds BMI:     32.83 O2 Sat:      96 % on Room air Pulse rate:   83 / minute BP sitting:   127 / 77  (left arm) Cuff size:   large  Vitals Entered By: Payton Spark CMA (May 18, 2010 8:26 AM)  O2 Flow:  Room air CC: CPE w/ fasting labs   Primary Care Maveric Debono:  Seymour Bars DO  CC:  CPE w/ fasting labs.  History of Present Illness: 70 yo WF presents for CPE w/o pap. She is s/p TAH for DUB.  Denies any vag dishcarge, pelvic pain or bleeding.    She weaned herself off Cymbalta and Inderal 8 mos ago and is doing well.  Weight has been stable.  Trying to eat healthier but not exercising.  Busy helping raise her 4 grandkids after the death of her daughter in law to cancer earlier this year.  She denies CP, DOE.  Due to see dentist and dermatologist.  Had a colonoscopy in 06, normal.  Tetanus is UTD.  Due for flu shot, fasting labs, mammogram and DEXA.  Denies fam hx of premature heart dz.    Current Medications (verified): 1)  Calcarb 600/d 600-125 Mg-Unit Tabs (Calcium-Vitamin D) .Marland Kitchen.. 1 Tab By Mouth Two Times A Day Ac 2)  Synthroid 100 Mcg Tabs (Levothyroxine Sodium) .Marland Kitchen.. 1 Tab By Mouth Qd 3)  Xanax 0.5 Mg Tabs (Alprazolam) .Marland Kitchen.. 1 Tab By Mouth Two Times A Day As Needed Anxiety 4)  Fish Oil 1000 Mg Caps (Omega-3 Fatty Acids) .Marland Kitchen.. 1 Capsule By Mouth Tid 5)  Lovastatin 40 Mg  Tabs (Lovastatin) .... 2 Tabs By Mouth At Bedtime 6)  Vitamin D 50,000 Iu Capsules .... Take 1 Capsule By Mouth Q Week 7)  Mirtazapine 30 Mg Tabs (Mirtazapine) .Marland Kitchen.. 1 Tab By Mouth Qhs  Allergies (verified): 1)  ! Compazine 2)  ! Cephalosporins 3)  ! Pcn  Past History:  Past Medical History: Reviewed history from 04/23/2009 and no changes required. 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 breast biopsies- fibrocystic breast dz,  cardiac ablation,  Cardiac Cath-- nml,  EKG- freq PVCs, coupling, PACs,  hysterectomy for DUB,  L inguinal hernia repair  MRI abd- liver hemanioma, parapelvic renal cysts, stress test- no ischemia, poor exercise tolearance 2005 ([facility]) Tonsillectomy Bronchoscopy, ph probe 11-07  Family History: Reviewed history from 09/18/2008 and no changes required. father- lung cancer, afib, mother- HTN, died at 5  sister- HTN  Social History: Reviewed history from 04/28/2009 and no changes required. 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  Has 2 grown children (one adopted) and 7 grandchildren.  Lives alone.  Nonsmoker, denies ETOH, does not exercise.  Review of Systems  The patient denies anorexia, fever, weight loss, weight gain, vision loss, decreased hearing, hoarseness, chest pain,  syncope, dyspnea on exertion, peripheral edema, prolonged cough, headaches, hemoptysis, abdominal pain, melena, hematochezia, severe indigestion/heartburn, hematuria, incontinence, genital sores, muscle weakness, suspicious skin lesions, transient blindness, difficulty walking, depression, unusual weight change, abnormal bleeding, enlarged lymph nodes, angioedema, breast masses, and testicular masses.    Physical Exam  General:  alert, well-developed, well-nourished, and well-hydrated.    Head:  normocephalic and atraumatic.   Eyes:  pupils equal, pupils round, and pupils reactive to light.  anterior chamber clouding, wears glasses Ears:  EACs patent; TMs translucent and gray with good cone of light and bony landmarks.  Nose:  no nasal discharge.   Mouth:  pharynx pink and moist and fair dentition.   Neck:  supple and no masses.  resting neck tremor, unchanged. no audible carotid bruits Breasts:  No mass, nodules, thickening, tenderness, bulging, retraction, inflamation, nipple discharge or skin changes noted.   Lungs:  Normal respiratory effort, chest expands symmetrically. Lungs are clear to auscultation, no crackles or wheezes. Heart:  Normal rate and regular rhythm. S1 and S2 normal without gallop, murmur, click, rub or other extra sounds. Abdomen:  Bowel sounds positive,abdomen soft and non-tender without masses, organomegaly or hernias noted. Msk:  no joint effusions Pulses:  2+ radial and pedal pulses Extremities:  trace LE edema Skin:  fair skin with mulitple pink scaley macules on chest and face Cervical Nodes:  No lymphadenopathy noted Psych:  good eye contact, not anxious appearing, and not depressed appearing.     Impression & Recommendations:  Problem # 1:  HEALTH MAINTENANCE EXAM (ICD-V70.0) Keeping healthy checklist for women reviewed. BP at goal.  BMI 32 c/w class I obesity. Tdap UTD.  PNX UTD.  Flu shot today. Udpate fasting labs.  Update mammogram and DEXA. Derm referral made for [facility]. RTC in 6 mos.  Complete Medication List: 1)  Calcarb 600/d 600-125 Mg-unit Tabs (Calcium-vitamin d) .Marland Kitchen.. 1 tab by mouth two times a day ac 2)  Synthroid 100 Mcg Tabs (Levothyroxine sodium) .Marland Kitchen.. 1 tab by mouth qd 3)  Xanax 0.5 Mg Tabs (Alprazolam) .Marland Kitchen.. 1 tab by mouth two times a day as needed anxiety 4)  Fish Oil 1000 Mg Caps (Omega-3 fatty acids) .Marland Kitchen.. 1 capsule by mouth tid 5)  Lovastatin 40 Mg Tabs (Lovastatin) .... 2 tabs by mouth at bedtime 6)  Vitamin D 50,000  Iu Capsules  .... Take 1 capsule by mouth q week 7)  Mirtazapine 30 Mg Tabs (Mirtazapine) .Marland Kitchen.. 1 tab by mouth qhs  Other Orders: Flu Vaccine 82yrs + MEDICARE PATIENTS (J4782) Administration Flu vaccine - MCR (N5621) Dermatology Referral (Derm[facility]) T-CBC No Diff ([id number]) T-Comprehensive Metabolic Panel [phone]) T-Lipid Profile [phone]) T-TSH [phone]) T-Vitamin D (25-Hydroxy) [phone]) T-Hemoglobin A1C (33295) T-Mammography Bilateral Screening (18841) T-DXA Bone Density/ Appendicular (66063) T-Dual DXA Bone Density/ Axial (01601)  Patient Instructions: 1)  Fasting labs today. 2)  Will call you w/ results tomorrow. 3)  Update mammogram and DEXA downstairs. 4)  Will get you back in with [facility]. 5)  Plan to update your stress test in the next year. 6)  REturn for f/u thyroid in 6 mos. Flu Vaccine Consent Questions     Do you have a history of severe allergic reactions to this vaccine? no    Any prior history of allergic reactions to egg and/or gelatin? no    Do you have a sensitivity to the preservative Thimersol? no    Do you have a past history of Guillan-Barre Syndrome? no    Do you currently  have an acute febrile illness? no    Have you ever had a severe reaction to latex? no    Vaccine information given and explained to patient? yes    Are you currently pregnant? no    Lot Number:[id number]   Exp Date:12/10/2010   Site Given  Left Deltoid IM  Orders Added: 1)  Flu Vaccine 28yrs + MEDICARE PATIENTS [Q2039] 2)  Administration Flu vaccine - MCR [G0008] 3)  Dermatology Referral [Derma] 4)  T-CBC No Diff [85027-10000] 5)  T-Comprehensive Metabolic Panel [80053-22900] 6)  T-Lipid Profile [80061-22930] 7)  T-TSH [21308-65784] 8)  T-Vitamin D (25-Hydroxy) [69629-52841] 9)  T-Hemoglobin A1C [23375] 10)  T-Mammography Bilateral Screening [77057] 11)  T-DXA Bone Density/ Appendicular [77081] 12)  T-Dual DXA Bone Density/ Axial [77080] 13)  Est.  Patient age 63&> [32440]     .lbmedflu

## 2010-07-14 NOTE — Assessment & Plan Note (Signed)
Summary: AOM/ bronchitis   Vital Signs:  Patient profile:   70 year old female Height:      67.5 inches Weight:      211 pounds BMI:     32.68 O2 Sat:      97 % on Room air Temp:     98.3 degrees F oral Pulse rate:   87 / minute BP sitting:   158 / 82  (left arm) Cuff size:   large  Vitals Entered By: Payton Spark CMA (June 27, 2010 4:03 PM)  O2 Flow:  Room air CC: Cough x 5 weeks. Also c/o L ear pain and ? burst ear drum.   Primary Care Provider:  Seymour Bars DO  CC:  Cough x 5 weeks. Also c/o L ear pain and ? burst ear drum.Marland Kitchen  History of Present Illness: 70 yo WF presents for a cough that started 5 wks ago.  She started taking Mucinex DM and got better but is back and is worse.  Her cough is productive.  No SOB.  No chest tightness but has pain from coughing.  Has a HA.  Has chills but no fevers.  The cough is keeeping her up at night.  No Sore throat, runny nose or postnasal drip.  Has ear fullness with L sided pain, hearing loss and purulent, bleeding x 2 days.  No GI uspet.      Allergies (verified): 1)  ! Compazine 2)  ! Cephalosporins 3)  ! Pcn  Past History:  Past Medical History: Reviewed history from 04/23/2009 and no changes required. Current Problems:  TRANSIENT ISCHEMIC ATTACK (ICD-435.9) HYPERLIPIDEMIA (ICD-272.4) GERD (ICD-530.81) DM (ICD-250.00) RENAL INSUFFICIENCY (ICD-588.9) DYSPNEA (ICD-786.05) HEALTH MAINTENANCE EXAM (ICD-V70.0) RESTING TREMOR (ICD-781.0) INSOMNIA, CHRONIC (ICD-307.42) ACTINIC KERATOSIS (ICD-702.0) OSTEOPENIA (ICD-733.90) OBESITY, NOS (ICD-278.00) HYPOTHYROIDISM, UNSPECIFIED (ICD-244.9) DEPRESSION, MAJOR, RECURRENT (ICD-296.30) ANXIETY (ICD-300.00) cardia arrythmia,  accessory tract,  G1P1001 Left calf path: SCC in situ  resting tremors - head L vocal cord dysfunction ?TIA -- seeing Dr Gaetano Net  Past Surgical History: Reviewed history from 09/21/2006 and no changes required. breast biopsies- fibrocystic breast  dz,  cardiac ablation,  Cardiac Cath-- nml,  EKG- freq PVCs, coupling, PACs,  hysterectomy for DUB,  L inguinal hernia repair  MRI abd- liver hemanioma, parapelvic renal cysts, stress test- no ischemia, poor exercise tolearance 2005 ([facility]) Tonsillectomy Bronchoscopy, ph probe 11-07  Social History: Reviewed history from 04/28/2009 and no changes required. 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  Has 2 grown children (one adopted) and 7 grandchildren.  Lives alone.  Nonsmoker, denies ETOH, does not exercise.  Review of Systems      See HPI  Physical Exam  General:  alert, well-developed, well-nourished, well-hydrated, and overweight-appearing.   Head:  normocephalic and atraumatic.  sinuses NTTP Eyes:  conjunctiva clear Ears:  R TM normal other than purulence inferiroly; L EAC filled with scant purulent, blood discharge, poor bony landmarks, perforated TM Nose:  sant rhinorrhea Mouth:  pharynx pink and moist.  clear postnasal drip Neck:  no masses.   Lungs:  dry hacking cough, nonlabored, scattered rhonchi, no wheezing Heart:  Normal rate and regular rhythm. S1 and S2 normal without gallop, murmur, click, rub or other extra sounds. Skin:  color normal.   Cervical Nodes:  No lymphadenopathy noted   Impression & Recommendations:  Problem # 1:  LOM (ICD-382.9) Will treated L>R AOM with perforation with 10 days of Ery-Tab.  Recheck in 10 days. Her updated medication  list for this problem includes:    Ery-tab 250 Mg Tbec (Erythromycin base) .Marland Kitchen... 1 tab by mouth q 6 hrs x 10 days  Problem # 2:  ACUTE BRONCHITIS (ICD-466.0) Treat with Ery-Tab, Mucinex and RX Hycodan for cough in addition to supportive care measures.  Call if any SOB or chest tightness occurs or if not starting to improve by the end of the wk.  Recheck in 10 days. Her updated medication list for this problem includes:    Ery-tab 250 Mg Tbec (Erythromycin base) .Marland Kitchen... 1 tab by mouth q 6  hrs x 10 days    Hydrocodone-homatropine 5-1.5 Mg/39ml Syrp (Hydrocodone-homatropine) .Marland KitchenMarland KitchenMarland KitchenMarland Kitchen 5 ml by mouth q 6 hrs as needed cough  Complete Medication List: 1)  Xanax 0.5 Mg Tabs (Alprazolam) .Marland Kitchen.. 1 tab by mouth two times a day as needed anxiety 2)  Fish Oil 1000 Mg Caps (Omega-3 fatty acids) .Marland Kitchen.. 1 capsule by mouth tid 3)  Lovastatin 40 Mg Tabs (Lovastatin) .... Take 2 tabs by mouth at bedtime 4)  Mirtazapine 30 Mg Tabs (Mirtazapine) .Marland Kitchen.. 1 tab by mouth qhs 5)  Ery-tab 250 Mg Tbec (Erythromycin base) .Marland Kitchen.. 1 tab by mouth q 6 hrs x 10 days 6)  Hydrocodone-homatropine 5-1.5 Mg/63ml Syrp (Hydrocodone-homatropine) .... 5 ml by mouth q 6 hrs as needed cough  Patient Instructions: 1)  Take PLAIN MUCINEX every 12 hrs + RX cough syrup as needed (up to 4 x a day), caution sedating and constipating side effects. 2)  Take 10 days of ERY-TAB for ear infection/ bronchitis. 3)  HOLD LOVASTATIN WHILE ON THIS. 4)  REturn for f/u in 10 days. 5)  Call if any problems. Prescriptions: HYDROCODONE-HOMATROPINE 5-1.5 MG/5ML SYRP (HYDROCODONE-HOMATROPINE) 5 ml by mouth q 6 hrs as needed cough  #200 ml x 0   Entered and Authorized by:   Seymour Bars DO   Signed by:   Seymour Bars DO on 06/27/2010   Method used:   Printed then faxed to ...       [facility]  [address] [address] [facility] (retail)       [address]       [place], [place]  [address]       Ph: [phone]       Fax: [phone]   RxID:   [id number] ERY-TAB 250 MG TBEC (ERYTHROMYCIN BASE) 1 tab by mouth q 6 hrs x 10 days  #40 x 0   Entered and Authorized by:   Seymour Bars DO   Signed by:   Seymour Bars DO on 06/27/2010   Method used:   Electronically to        [facility]  [address] (retail)       [address]       [place], [place]  [address]       Ph: [phone]       Fax: [phone]   RxID:   [id number]    Orders Added: 1)  Est. Patient Level III [60109]

## 2010-07-19 ENCOUNTER — Ambulatory Visit: Payer: Self-pay | Admitting: Family Medicine

## 2010-07-20 NOTE — Therapy (Signed)
Summary: Audiometry/[place] [facility]  Audiometry/[facility]   Imported By: Lanelle Bal 07/15/2010 09:13:45  _____________________________________________________________________  External Attachment:    Type:   Image     Comment:   External Document

## 2010-08-10 ENCOUNTER — Other Ambulatory Visit: Payer: Self-pay | Admitting: Family Medicine

## 2010-08-10 DIAGNOSIS — Z139 Encounter for screening, unspecified: Secondary | ICD-10-CM

## 2010-08-12 ENCOUNTER — Other Ambulatory Visit: Payer: Self-pay | Admitting: Family Medicine

## 2010-08-12 DIAGNOSIS — M8580 Other specified disorders of bone density and structure, unspecified site: Secondary | ICD-10-CM

## 2010-08-15 ENCOUNTER — Ambulatory Visit (HOSPITAL_COMMUNITY)
Admission: RE | Admit: 2010-08-15 | Discharge: 2010-08-15 | Disposition: A | Discharge status: Home / Self Care | Payer: Medicare Other | Source: Ambulatory Visit | Attending: Family Medicine | Admitting: Family Medicine

## 2010-08-15 DIAGNOSIS — M949 Disorder of cartilage, unspecified: Secondary | ICD-10-CM | POA: Insufficient documentation

## 2010-08-15 DIAGNOSIS — Z139 Encounter for screening, unspecified: Secondary | ICD-10-CM

## 2010-08-15 DIAGNOSIS — M8580 Other specified disorders of bone density and structure, unspecified site: Secondary | ICD-10-CM

## 2010-08-15 DIAGNOSIS — M899 Disorder of bone, unspecified: Secondary | ICD-10-CM | POA: Insufficient documentation

## 2010-08-15 DIAGNOSIS — Z1231 Encounter for screening mammogram for malignant neoplasm of breast: Secondary | ICD-10-CM | POA: Insufficient documentation

## 2010-09-02 ENCOUNTER — Telehealth: Payer: Self-pay | Admitting: Family Medicine

## 2010-09-02 NOTE — Telephone Encounter (Signed)
Please call pt and let her know that her DEXA scan shows osteopenia.  Continue calcium + Vitamin D daily and repeat in 2 yrs.

## 2010-09-02 NOTE — Telephone Encounter (Signed)
LMOM informing Pt of the above 

## 2010-09-14 ENCOUNTER — Encounter: Payer: Self-pay | Admitting: Family Medicine

## 2010-10-28 NOTE — Discharge Summary (Signed)
[name], [name]               ACCOUNT NO.:  [id number]   MEDICAL RECORD NO.:  [id number]          PATIENT TYPE:  INP   LOCATION:  6525                         FACILITY:  [facility]   PHYSICIAN:  Melina Fiddler, MD DATE OF BIRTH:  1940-09-07   DATE OF ADMISSION:  06/26/2005  DATE OF DISCHARGE:  06/28/2005                                 DISCHARGE SUMMARY   PRIMARY CARE PHYSICIAN:  Dr. Trinna Post   CONSULTING PHYSICIAN:  None.   FINAL DIAGNOSES:  1.  Lower respiratory tract infection.  2.  Depression.  3.  Tremor.  4.  Anxiety.  5.  Hyperlipidemia.  6.  Gastroesophageal reflux disease.  7.  Hypothyroidism.   PRINCIPAL PROCEDURE:  None.   LABORATORY DATA:  BNP on January 15 was less than 30.  D-dimer on January 15  was 0.31.  Influenza A and B were negative.  Last CBC prior to discharge on  January 17 showed white blood cells 11.5, H&H 1.1/34.8, platelets 238.  BMET  on day of discharge sodium 142, potassium 4.4, chloride 113, bicarbonate 23,  glucose 116, BUN 20, creatinine 0.9, calcium 8.7.   HOSPITAL COURSE:  #1 - This is a 70 year old female who came in with a three-  day history of acute respiratory symptoms including cough, increased  shortness of breath, chest tightness.  Patient also complained of a headache  and epigastric pain with nausea secondary to so much coughing she had been  previously prescribed a Z-PACK, but had not taken it.  Here at the hospital  patient received azithromycin and Rocephin for possible pneumonia.  She was  also started on Guaifenesin, Tussionex, and Deconamine SR.  Patient's  symptoms had much improved.  A chest x-ray on January 17 showed bibasilar  linear atelectasis.   #2 - DEPRESSION:  Patient was continued on her home dose of Effexor.   #3 - TREMOR:  Patient was continued on her home Xanax and Inderal.   #4 - ANXIETY:  Patient was continued on her home dose of Xanax and Remeron.   #5 - HYPERLIPIDEMIA:  Patient was continued  on her home dose of Lipitor.   #6 - GASTROESOPHAGEAL REFLUX DISEASE:  Patient was continued on a PPI.   #7 - HYPOTHYROIDISM:  Patient was continued on her home dose of Synthroid.   INSTRUCTIONS TO PATIENT AND FAMILY:  Patient was instructed to follow up  with Dr. Orson Slick later this week or early next week.   DISCHARGE MEDICATIONS:  1.  Albuterol MDI with spacer use two puffs q.2-4h. p.r.n. shortness of      breath.  2.  Azithromycin 250 mg p.o. daily on January 18 and January 19.  3.  Prednisone 40 mg one p.o. daily on January 18 and January 19.  4.  Xanax 0.5 mg with Inderal.  5.  Inderal 40 mg p.o. daily.  6.  Synthroid 100 mcg p.o. daily.  7.  Effexor XL 300 mg p.o. daily.  8.  Remeron 45 mg p.o. q.h.s.  9.  Lipitor 20 mg p.o. daily.  10. Prilosec 20 mg p.o. daily.  11. Guaifenesin 600 mg take two tablets p.o. b.i.d. p.r.n. cough.  12. Tussionex 5 mL take p.o. b.i.d. p.r.n. cough.  13. Deconamine SR take one p.o. b.i.d. p.r.n. cough.   Patient was a full code during her hospital stay.  Her condition was  improved.      Rolm Gala, M.D.    ______________________________  Melina Fiddler, MD    HG/MEDQ  D:  06/28/2005  T:  06/28/2005  Job:  [id number]   cc:   Trinna Post, M.D.  [facility]  [phone]

## 2010-10-28 NOTE — H&P (Signed)
NAMEVELETA, YAMAMOTO               ACCOUNT NO.:  [id number]   MEDICAL RECORD NO.:  [id number]          PATIENT TYPE:  INP   LOCATION:  6529                         FACILITY:  [facility]   PHYSICIAN:  Leighton Roach McDiarmid, M.D.DATE OF BIRTH:  Apr 05, 1941   DATE OF ADMISSION:  06/26/2005  DATE OF DISCHARGE:                                HISTORY & PHYSICAL   CHIEF COMPLAINT:  Increased shortness of breath.   HISTORY OF PRESENT ILLNESS:  Ms. Stricker is a 70 year old white female with  history of hypothyroidism, hypertriglyceridemia, tremors, depression, PSVT,  status post ablation in July 2006, and recent history of acute respiratory  insufficiency secondary to a prior left pneumonia in January 2006, who  presented to [facility] ER on Sunday, June 25, 2005, with 2-day  history of cold symptoms, dry cough, no sputum, afebrile with increased  shortness of breath and feeling chest tightness.  She denies any real chest  pain, no nausea or vomiting, no diaphoresis.  The patient was prescribed a Z-  Pack and discharged home.  Since then, she did not notice any improvement,  continued to having increased shortness of breath and cough, persistent, so  she came back to the ED today for evaluation.  In the ED, she was having  dyspnea even on oxygen at 2 L and croupy cough, constant, desatting during  cough.  She has been afebrile, also complaining of frontal headache.   REVIEW OF SYSTEMS:  RESPIRATORY:  There is no history of COPD.  The patient  is a nonsmoker.  No similar episode since January 2006.  Mildly sick contact  with granddaughter with possible RSV bronchiolitis.  CARDIOVASCULAR:  No  palpitations, status post PSVT ablation in July 2006.  No chest pain.  PSYCHIATRIC:  Depression, stable.   SOCIAL HISTORY:  The patient is widowed since November 2006 and has been  taking antidepressants since that time.  Now, she is living by herself, but  daughter is around and they visit  frequently.  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  There is no tobacco,  no alcohol and no illicit drug use history.   SURGICAL HISTORY:  1.  Tonsillectomy.  2.  Appendectomy.  3.  Left breast biopsy, benign.  4.  Left hernia repair x2.  5.  Hysterectomy secondary to DUB.  6.  Cardiac catheterization in 1989 showed nonobstructive disease.   PAST MEDICAL HISTORY:  As above.   MEDICATIONS:  1.  Synthroid 100 mcg p.o. daily.  2.  Effexor XR 300 mg p.o. daily.  3.  Remeron 45 mg p.o. nightly.  4.  Lipitor 20 mg p.o. daily.  5.  Prilosec 20 mg daily.  6.  Inderal 40 mg p.o. daily.  7.  Xanax 0.5 mg p.o. with Inderal.   OTHER MEDICAL DOCTOR:  Pulmonary -- Dr. Shelle Iron.   LABORATORY DATA:  At the ED, white blood cell count 6.9, hemoglobin 14.6,  hematocrit 42.1, platelets 228,000, ANC 5.8.  BNP less than 30.  BMP:  Sodium 138, potassium 4.4, chloride 109, BUN 16,  creatinine 0.9, glucose  124.  Influenza A and B negative.  D-dimer 0.31.  PEF pre-treatment 550 x2,  400 x1.   Chest x-ray shows positive atelectasis versus scarring, no acute disease.   ABG:  PCo2 32.4, pH 7.37, PO2 76, O2 SATS 95% on 2 L, bicarb 18.9.   PHYSICAL EXAMINATION:  VITAL SIGNS:  Temperature 98.7 degrees, heart rate  108, respiratory rate 22, blood pressure 163/77.  GENERAL:  In no acute distress, alert and oriented x3, anxious secondary to  increased shortness of breath __________.  HEENT:  Pupils equal, reactive to light.  Intact extraocular movements.  Oropharynx shows moist mucous membranes.  CARDIOVASCULAR:  Tachycardic with a regular rate and rhythm.  No murmurs,  rubs, or gallops.  No JVD.  RESPIRATORY:  Bilateral decreased breath sounds.  No wheezing, rhonchi or  rales.  ABDOMEN:  Positive bowel sounds.  Nontender, non-distended.  No  hepatosplenomegaly.  EXTREMITIES:  No edema.  No cyanosis.  NEUROLOGIC:  Cranial nerves II-XII intact.  Normal strength  and deep tendon  reflex.  Gait __________.  There is also a resting tremor that is aggravated  with movement of upper extremities and head.   ASSESSMENT AND PLAN:  1.  [age]-year-old white female patient with increased shortness of      breath in the setting of possible bilateral pneumonia evolving at this      time.  At this point, since the patient is afebrile with a normal white      blood cell count, no sputum production and chest x-ray that did not show      any acute infiltrate, but a positive atelectasis and scarring, I feel      this is most consistent with a viral process, but since the patient is      having persistent dry cough, mild hypoxia and very anxious, we will      admit her for observation and continue pulse oximetry with a diagnosis      of possible reactive airway disease versus asthma versus chronic      obstructive pulmonary disease.  For treatment, we will give albuterol      2.5 mg nebulizers q.4 h., q.2 h. p.r.n., Pulmicort inhaler twice daily,      continue prednisone 40 mg p.o. daily, continue Z-Pack as started on      June 26, 2005, incentive spirometry, monitor peak expiratory flow,      pre and post treatment.  2.  Headache secondary to cough __________:  Give ibuprofen 800 mg p.o. q.8      h.  3.  Hypothyroidism:  Continue Synthroid at home doses.  4.  Tremors on Inderal:  We will hold secondary to the patient's respiratory      status.  5.  Slight anxiety:  Continue Remeron and Effexor.  .  6.  Hyperlipidemia:  Continue Lipitor.  7.  Deep venous thrombosis prophylaxis:  Sequential compression devices.  8.  Gastrointestinal prophylaxis with Protonix 20 mg p.o. daily.     Adrian Blackwater, MD    ______________________________  Leighton Roach McDiarmid, M.D.   IM/MEDQ  D:  06/27/2005  T:  06/27/2005  Job:  [id number]

## 2010-10-28 NOTE — Discharge Summary (Signed)
[name], [name]               ACCOUNT NO.:  [id number]   MEDICAL RECORD NO.:  [id number]          PATIENT TYPE:  OIB   LOCATION:  3742                         FACILITY:  [facility]   PHYSICIAN:  Doylene Canning. Ladona Ridgel, M.D.  DATE OF BIRTH:  [birth year]   DATE OF ADMISSION:  12/16/2004  DATE OF DISCHARGE:  12/16/2004                                 DISCHARGE SUMMARY   DISCHARGE DIAGNOSES:  1.  Discharging after successful electrophysiology study/radiofrequency      catheter ablation of AV nodal reentry tachycardia.  2.  History of recurrent symptomatic paroxysmal supraventricular      tachycardia.   SECONDARY DIAGNOSES:  1.  Tremor.  2.  Dyslipidemia.  3.  Hypothyroidism.  4.  Depression.   PROCEDURE:  On December 16, 2004, electrophysiology study, radiofrequency  catheter ablation of long RP tachycardia.  Patient had dual AV node  physiology.  No pre-excitation.  This was unusual AVNRT atrial fibrillation-  inducible, and cardioverted.  Successful slow P wave modification after four  radiofrequency catheter bursts.   DISCHARGE DISPOSITION:  [name] [name] is discharged in the same day  after radiofrequency catheter ablation of PSVT.  She has had no post-  procedural complications.  She remains in sinus rhythm.  Her catheterization  studies are without hematoma, discharge, or erythema.  The patient has  remained afebrile and has had no chest pain or dyspnea.   The patient is discharged on the following medications:  Toprol XL 50 mg  daily, Synthroid 100 mcg daily, Remeron 40 mg at bedtime, Lipitor 20 mg  daily, Inderal 40 mg daily, Xanax 0.5 mg daily, Prilosec 20 mg daily,  Effexor 150 mg daily, enteric-coated aspirin 325 mg daily for the next 6  weeks.   Note:  If the patient plans dental work, just teeth-cleaning, up to and  through December, 2006, she is to call [phone] for antibiotic coverage.   Patient is asked not to drive for the next two days.  Avoid heavy lifting  for the  next two weeks.  Patient is able to shower.  She is to call [phone]  if she experiences pain or swelling at her catheterization sites.   PAIN MANAGEMENT:  Tylenol 325 mg 1-2 tabs q.4-6h. as needed.   She is to follow up with Dr. Ladona Ridgel on Tuesday, January 17, 2005 at 3:30 in  the afternoon.   BRIEF HISTORY:  [name] [name] is a 70 year old female with a history of  symptomatic tachycardia.  She feels palpitations/lightheadedness/short of  breath.  She has no chest pain.  She is not particularly fatigued.  She has  had at least two episodes of marked tachy arrhythmias.  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  It was self-limited with conversion to a sinus  tachycardia in the 120s to 130s.  Her tachycardia persisted for two days.  The patient felt shaky and short of breath.  The second episode, her heart  rate was in the 160s.  It was persistent and required IV Cardizem.  The  patient was then referred to Dr. Dietrich Pates.  She was placed on Toprol XL 50  mg daily with possible 1/2 tab when she felt symptomatic.  The patient says  that even on Toprol, her heart rate would burst into the 120s and 130s.  She  had mild dyspnea with this but no palpitations.  The patient has no history  of frank syncope.  Patient is now off Toprol in anticipation of the  procedure today, July 7th.  She felt her heart galloping and palpating  overnight last night.  She says she normally has PAC and PVCs.  She was  actually unable to get comfortable last night and was up most of the night.  Electrocardiogram of the tachy arrhythmia shows a short RP tachycardia.  The  patient is for ablation today.   HOSPITAL COURSE:  Patient presented electively on December 16, 2004 and underwent  electrophysiology study with successful ablation of a long RP unusual AV  nodal reentry tachycardia.  As described above, she has had no post-  procedural complications.  Is  discharged with medication and followup as  dictated.      [name]   GM/MEDQ  D:  12/16/2004  T:  12/16/2004  Job:  [id number]   cc:   Pricilla Riffle, M.D.   Jeffrey A. Tawanna Cooler, M.D. [facility]   Doylene Canning. Ladona Ridgel, M.D.

## 2010-10-28 NOTE — Discharge Summary (Signed)
NAMEEVONE, ARSENEAU               ACCOUNT NO.:  [id number]   MEDICAL RECORD NO.:  [id number]          PATIENT TYPE:  INP   LOCATION:  6708                         FACILITY:  [facility]   PHYSICIAN:  Rene Paci, M.D. LHCDATE OF BIRTH:  [birth year]   DATE OF ADMISSION:  06/17/2004  DATE OF DISCHARGE:  06/23/2004                                 DISCHARGE SUMMARY   DISCHARGE DIAGNOSES:  1.  Acute respiratory insufficiency.  2.  Bilateral pneumonia.  3.  VQ mismatch.  4.  Steroid induced hyperglycemia.   Ms. Stockley is a 70 year old white female who presented with a two week  history of viral illness that has gotten worse.  She describes progressive  cough.  She was diagnosed with viral pneumonia on June 14, 2004.  At that  time, her chest x-ray was normal.  She was also treated with steroids and  Biaxin.  Since that time she has gotten worse.  She presented back for  further evaluation.   PAST MEDICAL HISTORY:  1.  Status post T&A.  2.  Status post appendectomy.  3.  History of fibrocystic breast disease, status post benign left breast      biopsy.  4.  Left hernia repair x2.  5.  Hysterectomy secondary to dysfunctional uterine bleeding.  6.  History of cardiac catheterization in 1989 revealing nonobstructive      disease.  7.  Tremors.  8.  Hyperlipidemia.  9.  Depression.  10. Gastroesophageal reflux disease.   HOSPITAL COURSE:  PROBLEM #1 -  PULMONARY:  The patient presented with acute  respiratory insufficiency.  The patient had worsening hypoxic respiratory  failure.  This prompted pulmonary consultation.  CT scan of the chest was  negative for PE.  It did reveal right lower lobe and left lower lobe  infiltrates with mild bilateral effusions.  Dr. Shelle Iron saw the patient.  He  suspected that her hypoxemia was secondary to poor VQ matching with failure  of hypoxic vasoconstriction.  He agreed with antibiotics for treatment of  the pneumonia.  He changed her antibiotic  regimen to Maxipime and Avelox.  He also made recommendations for aggressive pulmonary toilet with IPPV, IS,  flutter valve.  He recommended discontinuing the Atrovent and Mucomyst as  they are drying and caustic to the airway.  The patient's respiratory  insufficiency slowly improved with resolution of her pneumonia.  The patient  has been tapered to oral steroids and oral antibiotics.  The patient will be  discharged home.  She has been instructed to follow up with Dr. Shelle Iron in  two weeks.  He will want to follow with full PFTs once she is recovered from  this illness.   PROBLEM #2 -  STEROID INDUCED HYPERGLYCEMIA:  The patient's hemoglobin A1C  was normal.   PROBLEM #3 -  HYPOTHYROIDISM:  The patient's TSH was noted to be depressed  at 0.190.  This is probable a sick euthyroid secondary to current illness  and we would have her primary care follow up with repeat PFTs in a couple of  weeks.   PROBLEM #4 -  GASTROINTESTINAL:  The patient had incidental finding of  splenic granulomata.  She also had a probable benign hemangioma of the lower  aspect of the right lobe of the liver on CT.   DISCHARGE MEDICATIONS:  1.  Avelox 400 mg daily, last dose June 29, 2004.  2.  Synthroid 100 mcg daily.  3.  Effexor XR 300 mg daily.  4.  Remeron 45 mg q.h.s.  5.  Lipitor 20 mg daily.  6.  Prilosec 20 mg daily.  7.  Prednisone 20 mg two tablets for two days, then one tablet for two days,      then one half tablet for two days.  8.  Xanax 0.5 mg daily.  9.  Inderal 40 mg daily.   FOLLOW UP:  1.  Follow up with Dr. Tawanna Cooler Friday, June 24, 2004, at 11 a.m.  She may      also need to follow up with Dr. Tawanna Cooler before she is allowed to return to      work.  2.  Follow up with Dr. Shelle Iron in about two weeks.  At that time he may want      to schedule her for PFTs.      Laur   LC/MEDQ  D:  06/23/2004  T:  06/23/2004  Job:  [id number]   cc:   Marcelyn Bruins, M.D. [facility]

## 2010-10-28 NOTE — H&P (Signed)
NAMEELNORIA, LIVINGSTON               ACCOUNT NO.:  [id number]   MEDICAL RECORD NO.:  [id number]          PATIENT TYPE:  INP   LOCATION:  5731                         FACILITY:  [facility]   PHYSICIAN:  Tinnie Gens A. Tawanna Cooler, M.D. 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   The patient was seen in the office on June 14, 2004.  At that time, she  had a cough for a couple of days and developed some shortness of breath.  At  that time, she had some crackles on her right base on physical exam.  Subsequent chest x-ray showed a viral type pneumonia pattern.  She was  wheezing at that time also. She was given Biaxin 500 mg b.i.d. empirically  because at that time we did not have a chest x-ray that showed a viral type  pattern.  And, indeed, the crackles were just right sided.  She was started  on prednisone 20 mg 2 tablets daily, Tussin HC for cough.  She came back  today, June 17, 2004, worse.  She said she is short of breath.  She cannot  breathe.  She is coughing all night.  She cannot sleep.  She denies any  fever or sputum production.   The patient was seen in the office on [employer].  Because of the severity  of her symptoms and the increasing shortness of breath and coughing  unresponsive to maximum outpatient therapy, the patient was admitted to the  hospital for further evaluation and therapy.   PAST MEDICAL HISTORY:   HOSPITALIZATIONS:  1.  She has been previously hospitalized for T&A, appendectomy, childbirth x      1.  2.  They have one adopted son who is 56 years of age, and a daughter is 85.  3.  Breast biopsy of left breast which proved to be fibrocystic disease by      Dr.  Luan Pulling.  4.  Left hernia repair x 2.  Second repair mesh was put in.  5.  Hysterectomy for dysfunctional uterine bleeding; ovaries were left      intact.  6.  Cardiac catheterization in 1989 for evaluation of chest pain.  This was      normal.   PAST ILLNESSES:  None.   INJURIES:  None.   DRUG ALLERGIES:  None except that COMPAZINE makes her tingle and gives her  side effects.   CURRENT MEDICATIONS:  1.  Inderal 40 daily for benign tremor.  2.  Synthroid 0.1 daily.  3.  Xanax 0.5 with Inderal because of the tremor.  4.  Effexor 300 mg q.h.s. for depression.  5.  Remeron 45 mg q.h.s. for depression.  6.  Lipitor 10 q.h.s. for hyperlipidemia.  7.  Prilosec 20 daily p.r.n. for GERD.  HABITS:  She does not smoke or drink any alcohol.   REVIEW OF SYSTEMS:  She does wear glasses for distant vision and gets yearly  eye exams.  She gets regular dental care.  CARDIOPULMONARY:  Negative except  for above.  GI:  Negative.  She had a flexible sigmoidoscopy, and that was  negative.  GENITOURINARY:  She had occasional urinary tract infection in the  past and no pyelonephrosis.  She has stress incontinence which is worse now  that she is coughing and wheezing all the time.  OB-GYN:  She is gravida 1,  para 1, AB 0.  As noted above, she had a TAH for DUB in 1981.  She is off  HRT.  She also had a history of phlebitis.  The patient states she was  pregnant at the time, had discomfort in her chest in the middle of the  night, consulted a local physician and said she had a little superficial  phlebitis in her legs and must have had a PE in the middle of the night.  He  did not prescribe any therapy. She went home and recovered without any  sequelae.  No diagnostic studies were done.   SOCIAL HISTORY:  She was originally born and raised in [place], [place], moved  to [place] in 1991.  One daughter, Neysa Bonito, graduated from [employer].  Adopted son, Susy Frizzle, who is married and is a Engineer, maintenance here in  [place].  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  Her husband died a  couple of years ago with metastatic cancer.   FAMILY HISTORY:  Dad died at 55 of heart disease and lung cancer.  Mother  has hypertension.  No brothers.  One sister has hypertension.   PHYSICAL EXAMINATION:  VITAL SIGNS:  Height 5 feet 10 inches, weight 221.  Temperature 98.8, pulse 80 and regular.  Respirations: chronic coughing,  difficult to gauge, probably in the 30 range.  Blood pressure 140/80.  GENERAL:  Well-developed, well-nourished, slightly obese white female,  constantly coughing and short of breath.  HEENT:  Examination of head, eyes, ears, nose, and throat was negative.  NECK:  Supple.  Thyroid was not enlarged.  CHEST:  Bilateral wheezing, no crackles.  CARDIAC:  Exam negative.  BREASTS:  Scar, left breast at 4 o'clock, previous biopsy.  ABDOMEN:  Exam shows scar in the midline from previous hysterectomy and  right lower quadrant previous appendectomy.  PELVIC/RECTAL:  Deferred.  EXTREMITIES:  Normal skin.  Peripheral pulses normal.   IMPRESSION:  1.  Viral pneumonia with secondary asthma.  2.  History of benign intentional tremor.  3.  Status post childbirth x 1.  4.  History of atypical chest pain, cardiac catheterization negative.  5.  Status post appendectomy.  6.  Status post tonsillectomy and adenoidectomy.  7.  Status post hysterectomy due to dysfunctional uterine bleeding.  8.  Status post hernia repair x 2 on the left.  9.  History of fibrocystic breast disease.  10. History of hypothyroidism on Synthroid because of the goiter.  11. History of depression because of husband's death.   PLAN:  1.  Admit.  2.  IV fluids.  3.  IV steroids.  4.  IV antibiotics empirically.  5.  Will get an ABG on room air to see if she is a candidate for oxygen,      although she does not appear to be hypoxic.       JAT/MEDQ  D:  06/17/2004  T:  06/17/2004  Job:  [id number]

## 2010-10-28 NOTE — Consult Note (Signed)
NAMEJAMELIA, [name] [name]               ACCOUNT NO.:  [id number]   MEDICAL RECORD NO.:  [id number]          PATIENT TYPE:  EMS   LOCATION:  MAJO                         FACILITY:  [facility]   PHYSICIAN:  Clinton D. Maple Hudson, M.D. DATE OF BIRTH:  1941-04-06   DATE OF CONSULTATION:  11/16/2005  DATE OF DISCHARGE:  11/16/2005                                   CONSULTATION   REASON FOR CONSULTATION:  This history is reconstructed from [facility].  Telephone interference rendered the original unintelligible.  This is an emergency room consultation requested by Dr. Sheppard Penton. 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  She had received antibiotic for  bladder infection and prior to that there was concern that she was having  angioedema or an anaphylactic response to penicillin, evaluated here She was  treated with Pepcid, Benadryl, IV steroids.  Three days ago (Monday), she  says she felt some swelling of the face and last night she said the sense of  throat tightness returned. Labs including CT of the throat are unremarkable.  She has not had obvious wheeze or  hives.   MEDICATIONS:  1.  Remeron 150 mg at h.s.  2.  Xanax 0.25 mg daily.  3.  Inderal 40 mg daily.  4.  Lipitor 20 mg daily.  5.  Effexor 300 mg daily.   ALLERGIES/INTOLERANCES:  Intolerance to COMPAZINE, PENICILLIN AND PROTONIX.   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  She lives alone  and is widowed.  She is a non-smoker.   FAMILY HISTORY:  Nobody else with a similar problem is known to her in the  family.   PHYSICAL EXAMINATION:  VITAL SIGNS:  Temperature 98.1 degrees, pulse regular  at 56, respirations 18, blood pressure 136/87.  GENERAL:  Somewhat overweight, calm, pleasant, fully-oriented woman,  cooperative.  SKIN:  Perhaps a very minimal few areas  of macular erythematous  hyperpigmentation on the forehead.  I am unimpressed that this is really a  rash.  NODES:  No adenopathy found at the neck, supraclavicular or axillary areas.  HEENT:  Pupils equal and reactive.  Conjunctivae not injected.  Nasal airway  clear.  Posterior pharynx may be minimally reddened but this is subjective.  There is thrush on the tongue.  No stridor or thyromegaly.  No bruits.  Tongue protrudes midline.  Palate lifts normally.  Voice quality is strained  without stridor.  She is somewhat distractible.  CHEST:  Quiet and clear.  LUNGS:  Fields unlabored.  HEART:  A regular rhythm.  No murmur or gallop.  Note that she can sit up  easily without assistance.  ABDOMEN:  No enlargement of the liver or spleen.  EXTREMITIES:  No clubbing, cyanosis or edema.   LABORATORY DATA:  As noted above.   IMPRESSION:  Altered voice quality:  I think this is more likely a vocal  cord dysfunction syndrome rather than angioedema.  It is unlikely  that this  is a sustained allergic process, now two weeks after penicillin exposure.  The chest CT and sinus CT would appear to exclude significant bulk organic  disease in these areas that might effect the recurrent laryngeal nerves.  Note that her occupation is a Engineer, civil (consulting)  treated her for anxiety disorder which  does put her into a demographic at risk for vocal cord dysfunction syndrome.   RECOMMENDATIONS:  I think she can go home.  Her respiratory status is not  compromised at this time.  I have suggested soft foods, warm liquids, and  that she keep her scheduled appointment tomorrow with Dr. Shan Levans.  I  would anticipate going forward with an Immuno-Cap screening which could be  done for penicillin antibodies, and she could have an ENT evaluation for a  fiberoptic laryngoscopy, as well as a swallowing evaluation.  She may also  need a more formal psychiatric evaluation.      Clinton D. Maple Hudson, M.D.  Electronically  Signed     CDY/MEDQ  D:  11/16/2005  T:  11/17/2005  Job:  [id number]   cc:   Shan Levans, M.D. [facility]  [address]  [place]  [place] [address]   Seymour Bars, D.O.  [facility].  [address]  [place], [place] [address]

## 2010-10-28 NOTE — Discharge Summary (Signed)
NAMEMAEVIS, [name] [name]               ACCOUNT NO.:  [id number]   MEDICAL RECORD NO.:  [id number]          PATIENT TYPE:  INP   LOCATION:  2106                         FACILITY:  [facility]   PHYSICIAN:  [name] [name], M.D. LHCDATE OF BIRTH:  07-Dec-1940   DATE OF ADMISSION:  11/10/2005  DATE OF DISCHARGE:  11/12/2005                                 DISCHARGE SUMMARY   DISCHARGE DIAGNOSES:  1.  Suspected allergic reaction to penicillin VK with rash, dysphagia, and      upper airway swelling.  2.  Tremor.  3.  Acute renal insufficiency.  4.  Diabetes, type 2.   LABORATORY DATA:  Date, November 12, 2005:  White blood cells 14, hemoglobin  12.5, hematocrit 36.9, platelets 265.  Date, November 12, 2005:  Sodium 141,  potassium 3.7, chloride 109, CO2 27, glucose 155, BUN 19, creatinine 1.0,  calcium 8.5.   HISTORY OF PRESENT ILLNESS:  This is a 70 year old patient of Dr. __________  , followed by Dr. Shelle [name] for pulmonary needs, cardiac with Dr. Tenny [name].  She  was started on penicillin VK per her dentist for what was thought to be an  infected tooth in the right lower jaw.  Three days prior to admission she  noticed a rash behind her left ear and earlobe enlargement.  This progressed  to a rash on her left flank, right flank, hives in the groin area, and mild  dysphagia.  She treated herself with Benadryl and Advil.  She returns to the  emergency room with a significant diffuse swelling.  In the emergency room,  she was given 125 mg Solu-Medrol, epinephrine injections x2, Pepcid, and  glucagon which actually improved her to the point where she could actually  swallow.   HOSPITAL COURSE BY DISCHARGE DIAGNOSIS:  1.  Acute angioedema secondary to penicillin allergic reaction.  Treatment      consisted of IV H2 blockade, IV Solu-Medrol, and empiric sliding scale      insulin.  In the intensive care, her airway edema, rash, and dysphagia      continued to improve over the course of her hospitalization.  On   hospital day #3, this was thought to be resolved, and she was cleared      for discharge.  Discharge instructions included prednisone taper 10 mg      at 40 mg for three days, then 30 mg for three days, then 20 for three      days, then 10 for three days, then discontinue.  Pepcid 20 mg, two tabs      daily for 10 days.  Benadryl 25 mg, three times a day for 10 days.  She      was instructed to continue her home medications as prescribed.  2.  Further discharge diagnoses included acute renal insufficiency which was      resolved with gentle IV      resuscitation.  3.  Diabetes, type 2.  She was instructed to continue her home medications.   FOLLOWUP APPOINTMENT:  She was instructed to follow up with Dr. Jayme [name] 1  week after  discharge.      [name] [name], N.P. [facility]      [name] [name], M.D. [facility]  Electronically Signed    PB/MEDQ  D:  12/15/2005  T:  12/15/2005  Job:  [id number]

## 2010-10-28 NOTE — H&P (Signed)
[name] [name], [name] [name]               ACCOUNT NO.:  [id number]   MEDICAL RECORD NO.:  [id number]          PATIENT TYPE:  OIB   LOCATION:  2899                         FACILITY:  [facility]   PHYSICIAN:  Doylene Canning. Ladona Ridgel, M.D.  DATE OF BIRTH:  Oct 17, 1940   DATE OF ADMISSION:  12/16/2004  DATE OF DISCHARGE:                                HISTORY & PHYSICAL   PRIMARY CARE-GIVER:  Dr. Tinnie Gens A. Todd.   CARDIOLOGIST:  Dr. Pricilla Riffle.   ELECTROPHYSIOLOGIST:  Dr. Doylene Canning. Ladona Ridgel.   VITAL SIGNS:  Temperature is 97.6, blood pressure 109/71, pulse is 101,  respiration rate is 18, oxygen saturation 94% on room air.   PRESENTING CIRCUMSTANCE:  I'm here for ablation.   HISTORY OF PRESENT ILLNESS:  [name] [name] is a 70 year old female with a  history of symptomatic tachycardias.  She feels especially  lightheaded and  short of breath with some feeling of palpitation.  She does not experience  chest pain.  She is not particularly fatigued.  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  It was self-limited with the conversion to a  heart rate into the 120s and 130s by the time that she was ushered into an  emergency room, however, the tachycardia persisted about 2-3 days.  The  patient was shaky and short of breath.  The second event occurred with heart  rate in the 160s; it was persistent and required IV Cardizem.  The patient  was referred to Dr. Dietrich Pates by Dr. Tawanna Cooler.  She was placed on Toprol-XL 50  mg daily with a possible 1/2 tab to take if she became symptomatic.  She  says even on Toprol, her heart rate will burst into the 120s and 130s and  with this, she has mild dyspnea, but no palpitations.  The patient has never  had frank syncope.  The patient was seen by Dr. Lewayne Bunting on November 24, 2004 and after his inspection of the  electrocardiogram which showed a short  RP tachycardia, he recommended ablation of a paroxysmal possible reentrant  tachycardia.  The patient has been taken off Toprol in anticipation of the  procedure today.  She felt her galloping and palpitating through the night  last night.  She says she has PACs and PVCs on her electrocardiogram.  She  was unable to get comfortable and thought for a while that she would report  to the emergency room.  She was up most of last night and decided to just  wait until today for admission.  The patient presents today then for  ablation of PSVT.   PHYSICAL EXAMINATION:  GENERAL:  The patient is alert and oriented x3, in no  acute distress.  HEENT:  Eyes:  Pupils are equal, round and reactive to light.  Extraocular  movements are intact.  Sclerae are anicteric.  No nasal discharge.  LUNGS:  Lungs are clear to auscultation and  percussion bilaterally.  HEART:  Regular rate and rhythm without murmur.  ABDOMEN:  Abdomen is soft, mildly protuberant and non-distended.  Bowel  sounds are present.  EXTREMITIES:  Dorsalis pedis pulses are 4/4 bilaterally.  Radial pulse is  4/4 bilaterally.  The patient says she has been having some swelling in her  ankles in the last couple of months.   IMPRESSION:  1.  Short reentrant paroxysmal tachycardia, has onset without warning, she      is tachycardic on Toprol.  2.  Tremor for which she takes Inderal and Xanax.  3.  Dyslipidemia.  4.  Hypothyroidism.  5.  Depression.   PLAN:  Ablation of PSVT, Dr. Lewayne Bunting.      [name]   GM/MEDQ  D:  12/16/2004  T:  12/16/2004  Job:  [id number]

## 2010-10-28 NOTE — Op Note (Signed)
[name] [name], [name] [name]               ACCOUNT NO.:  [id number]   MEDICAL RECORD NO.:  [id number]          PATIENT TYPE:  OIB   LOCATION:  3742                         FACILITY:  [facility]   PHYSICIAN:  Doylene Canning. Ladona Ridgel, M.D.  DATE OF BIRTH:  Aug 28, 1940   DATE OF PROCEDURE:  12/16/2004  DATE OF DISCHARGE:                                 OPERATIVE REPORT   PROCEDURE PERFORMED:  Electrophysiologic study and RF catheter ablation of  unusual AV node reentry tachycardia.   INTRODUCTION:  The patient is a 70 year old woman who has had recurring  history of tachy palpitations and documented SVT in the past with rates  between 160 and 200 beats per minute. This is despite medical therapy. She  is now referred for electrophysiologic study and catheter ablation.   DESCRIPTION OF PROCEDURE:  After informed consent was obtained, the patient  was taken to the diagnostic EP lab in fasting state. After the usual  preparation and draping, intravenous fentanyl and midazolam and Valium was  given for sedation.   It should be noted that the patient required large amounts of all the above  medications for adequate sedation. A 6-French hexapolar catheter was  inserted percutaneously into the right jugular vein and advanced to the  coronary sinus. A 5-French quadripolar catheter was inserted percutaneously  into the right femoral vein and advanced to the RV apex. A 5-French  quadripolar catheter was inserted percutaneously into the right femoral vein  and advanced to the His bundle region. A 5-French quadripolar catheter was  inserted percutaneously in the right femoral vein and advanced to the right  atrium. After measurement of basic intervals, rapid ventricular pacing was  carried out from the RV apex and stepwise decreased down to 310 milliseconds  where VA Wenckebach was observed. During rapid ventricular pacing the atrial  activation was midline and decremental. Next programmed ventricular  stimulation  was carried out from the RV apex at basic drive cycle length of  578 milliseconds. The S1-S2 interval was stepwise decreased from 440  milliseconds down to 230 milliseconds where ventricular refractoriness was  observed. During programmed ventricular stimulation the atrial activation  was midline and decremental. Next programmed atrial stimulation was carried  out from the coronary sinus as well as the high right atrium at basic drive  cycle length of 469 milliseconds. The S1-S2 interval was stepwise decreased  from 440 milliseconds down to 270 milliseconds where the AV node ERP was  observed. During programmed atrial stimulation there multiple AH jumps, echo  beats and double echo beats but there was no inducible SVT initially. Next  rapid atrial pacing was carried out from the coronary sinus at paced cycle  length of 500 milliseconds and stepwise decreased down to 370 milliseconds  where AV Wenckebach was observed. During rapid atrial pacing the PR interval  was equal to but not greater than the RR interval. At this point  Isoproterenol was infused at a rate of one mcg per minute. Additional rapid  atrial pacing was carried out as was programmed atrial stimulation. With  both rapid atrial pacing as well as  programmed atrial stimulation there was  inducible SVT. The SVT was a narrow QRS tachycardia at a cycle length of 335  milliseconds. Mapping demonstrated a long RP tachycardia. The earliest  atrial activation was in the His A. The coronary sinus and the high right  atrial activations were both late. PVCs replaced at the time of His bundle  refractoriness and during tachycardia and this did not preexcite the atrium.  In addition ventricular pacing during tachycardia at a cycle length just  slightly faster than the tachycardia demonstrated a VAV conduction sequence.  The patient, during additional rapid atrial pacing, spontaneously developed  atrial fibrillation. This atrial fibrillation  was treated with IV Lopressor  and IV ibutilide and defibrillation slowed but did not terminate. The  patient was more deeply sedated with Valium, fentanyl and Versed and  underwent DC cardioversion with 200 synchronized joules restoring sinus  rhythm. At this point the ablation catheter was maneuvered into Koch's  triangle and mapping of Koch's triangle was carried out. Four RF energy  applications were delivered to site 6-8 in Koch's triangle. During RF energy  application there was prolonged accelerated junctional rhythm. Following  ablation, additional rapid atrial pacing was carried out demonstrating no  inducible SVT. There was residual echo beats. The AV Wenckebach cycle length  increased following ablation from 370 milliseconds to 450 milliseconds. At  this point the catheters were removed. Hemostasis was assured, the patient  was returned to her room in satisfactory condition. Of note, the patient was  not rechallenged with Isuprel after ablation secondary to her previously  induced atrial fibrillation during Isoproterenol infusion.   COMPLICATIONS:  There were no immediate procedure complications.   RESULTS:  A. Baseline ECG. The baseline ECG demonstrates sinus rhythm with  normal axis and intervals.  B. Baseline intervals. Sinus node cycle length was 703 milliseconds. The PR  interval 193 milliseconds. The QRS duration 77 milliseconds. The HV interval  34 milliseconds. The AH interval 84 milliseconds.  C. Rapid ventricular pacing. Rapid ventricular pacing was carried out from  the RV apex and demonstrated VA Wenckebach cycle length of 310 milliseconds.  During rapid ventricular pacing the atrial activation was midline and  decremental.  D. Programmed ventricular stimulation. Programmed ventricular stimulation  was carried out from the RV apex at basic drive cycle length of 295  milliseconds. The S1-S2 interval was stepwise decreased from 440 milliseconds down to 230  milliseconds where ventricular refractoriness was  observed. During programmed ventricular stimulation the atrial activation  was midline and decremental.  E. Rapid atrial pacing. Rapid atrial pacing was carried out from the  coronary sinus as well as the high right atrium at pacing cycle length of  500 milliseconds and stepwise decreased down to 370 milliseconds where AV  Wenckebach was observed. During rapid atrial pacing the PR interval was  equal to the RR interval. During Isoproterenol infusion rapid atrial pacing  resulted in the initiation of SVT.  F. Programmed atrial stimulation. Programmed atrial stimulation was carried  out from the coronary sinus as well as the high right atrium at basic drive  cycle length of 621 milliseconds and 400 milliseconds. The S1-S2 interval  was stepwise decreased down to 270 milliseconds where the AV node ERP was  observed. During programmed atrial stimulation on isoproterenol, there was  inducible SVT.  G. Arrhythmias observed:  1.  Unusual AV node reentry tachycardia. Initiation: rapid atrial pacing and      programmed atrial stimulation on isoproterenol. Duration was sustained.  Cycle length 335 milliseconds. Method of termination was spontaneous as      well as ventricular pacing and atrial pacing.  2.  Atrial fibrillation. Initiation; rapid atrial pacing. Duration      sustained. Termination was with DC cardioversion.      1.  Mapping. Mapping of Koch's triangle demonstrated the usual size and          orientation.          1.  RF energy application. A total of four energy applications were              delivered to sites 6-8 in Koch's triangle. During RF energy              application there was prolonged accelerated junctional rhythm.              Following RF energy application there was no inducible SVT.   CONCLUSIONS:  This study demonstrates successful electrophysiologic study  and RF catheter ablation of unusual AV node reentry  tachycardia with total  of four RF energy application to Koch's triangle. This also demonstrates  inducible atrial fibrillation on isoproterenol, the implications of which  are unclear secondary to its induction with rapid atrial pacing.       GWT/MEDQ  D:  12/16/2004  T:  12/16/2004  Job:  [id number]   cc:   Tinnie Gens A. Tawanna Cooler, M.D. [facility]   Pricilla Riffle, M.D.

## 2010-10-28 NOTE — Assessment & Plan Note (Signed)
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  She comes  today with an attack starting where the sensation in her throat feels  tight.  This has been present for less than 24 hours and not associated with  any cough. She does have significant hoarseness, however.   MEDICATIONS:  For full listing of medication please see face sheet column  dated February 02, 2006.  Although [facility]  ago, she has not yet started Nexium because she, has not eaten very much.   PHYSICAL EXAMINATION:  GENERAL:  She is an anxious white female in no acute  distress.  VITAL SIGNS:  Stable vital signs.  HEENT:  Unremarkable.  Oropharynx clear.  LUNG FIELDS:  Perfectly clear to auscultation and percussion except for  minimal pseudo-wheeze.  CARDIAC:  Regular rate and rhythm without murmur, gallop, or rub.  ABDOMEN:  Soft, benign.  EXTREMITIES:  Warm without calf tenderness, cyanosis, clubbing, or edema.   Spirometry done today revealed an FEV1 of 84% predicted with a normal ratio  and normal expiratory loop.  Inspiratory loop was not performed.  Chart  review indicates she did have a positive methacholine challenge test dated  August 28, 2005.   IMPRESSION:  Although this patient has a positive methacholine challenge  test and is on Inderal, she has not responded to beta-agonists in the past  and today had no evidence of airflow obstruction during an early attack.  She is already under the  care of the [facility] for [facility] cord dysfunction  but has not yet started the medicines that they recommended.  In the past  she says she has responded to prednisone and Atarax and so I think it is  fine to give her a short course of prednisone, namely 10 mg, #14, to be  tapered off over 6 days; Atarax 25 mg, 1 q.4 hours; but then I would like  her to start following the instructions given at the [facility] which  includes high doses of Nexium that is 40 mg b.i.d. before meals and  supplement the daily Xanax she takes at 0.5 mg q.a.m. with additional Xanax  p.r.n. during the day to help relax her throat.                                  Charlaine Dalton. Sherene Sires, MD, [facility]   MBW/MedQ  DD:  02/02/2006  DT:  02/03/2006  Job #:  [id number]

## 2010-12-03 ENCOUNTER — Other Ambulatory Visit: Payer: Self-pay | Admitting: Family Medicine

## 2011-04-14 ENCOUNTER — Other Ambulatory Visit: Payer: Self-pay | Admitting: *Deleted

## 2011-04-14 MED ORDER — MIRTAZAPINE 30 MG PO TABS: 30.0000 mg | ORAL_TABLET | Freq: Every day | ORAL | Status: DC

## 2013-11-18 ENCOUNTER — Ambulatory Visit (HOSPITAL_COMMUNITY)
Admission: RE | Admit: 2013-11-18 | Discharge: 2013-11-18 | Disposition: A | Discharge status: Home / Self Care | Payer: Medicare Other | Source: Ambulatory Visit | Attending: Internal Medicine | Admitting: Internal Medicine

## 2013-11-18 ENCOUNTER — Other Ambulatory Visit (HOSPITAL_COMMUNITY): Payer: Self-pay | Admitting: Internal Medicine

## 2013-11-18 DIAGNOSIS — M7989 Other specified soft tissue disorders: Secondary | ICD-10-CM | POA: Insufficient documentation

## 2013-11-18 DIAGNOSIS — M79609 Pain in unspecified limb: Secondary | ICD-10-CM | POA: Insufficient documentation

## 2013-11-18 DIAGNOSIS — R600 Localized edema: Secondary | ICD-10-CM

## 2014-12-23 ENCOUNTER — Encounter: Payer: Self-pay | Admitting: Internal Medicine

## 2014-12-25 ENCOUNTER — Encounter: Payer: Self-pay | Admitting: Internal Medicine

## 2015-03-11 DIAGNOSIS — Z23 Encounter for immunization: Secondary | ICD-10-CM | POA: Diagnosis not present

## 2015-03-11 DIAGNOSIS — E785 Hyperlipidemia, unspecified: Secondary | ICD-10-CM | POA: Diagnosis not present

## 2015-03-11 DIAGNOSIS — R7301 Impaired fasting glucose: Secondary | ICD-10-CM | POA: Diagnosis not present

## 2015-03-11 DIAGNOSIS — I10 Essential (primary) hypertension: Secondary | ICD-10-CM | POA: Diagnosis not present

## 2015-03-17 DIAGNOSIS — R251 Tremor, unspecified: Secondary | ICD-10-CM | POA: Diagnosis not present

## 2015-03-17 DIAGNOSIS — R7301 Impaired fasting glucose: Secondary | ICD-10-CM | POA: Diagnosis not present

## 2015-03-17 DIAGNOSIS — E785 Hyperlipidemia, unspecified: Secondary | ICD-10-CM | POA: Diagnosis not present

## 2015-04-20 ENCOUNTER — Emergency Department (HOSPITAL_COMMUNITY)
Admission: EM | Admit: 2015-04-20 | Discharge: 2015-04-20 | Disposition: A | Discharge status: Home / Self Care | Payer: Medicare Other | Attending: Emergency Medicine | Admitting: Emergency Medicine

## 2015-04-20 ENCOUNTER — Encounter (HOSPITAL_COMMUNITY): Payer: Self-pay | Admitting: Emergency Medicine

## 2015-04-20 ENCOUNTER — Emergency Department (HOSPITAL_COMMUNITY): Payer: Medicare Other

## 2015-04-20 DIAGNOSIS — Z9889 Other specified postprocedural states: Secondary | ICD-10-CM | POA: Diagnosis not present

## 2015-04-20 DIAGNOSIS — Z880 Allergy status to penicillin: Secondary | ICD-10-CM | POA: Insufficient documentation

## 2015-04-20 DIAGNOSIS — W01198A Fall on same level from slipping, tripping and stumbling with subsequent striking against other object, initial encounter: Secondary | ICD-10-CM | POA: Diagnosis not present

## 2015-04-20 DIAGNOSIS — Z79899 Other long term (current) drug therapy: Secondary | ICD-10-CM | POA: Diagnosis not present

## 2015-04-20 DIAGNOSIS — Y998 Other external cause status: Secondary | ICD-10-CM | POA: Insufficient documentation

## 2015-04-20 DIAGNOSIS — M25511 Pain in right shoulder: Secondary | ICD-10-CM | POA: Diagnosis not present

## 2015-04-20 DIAGNOSIS — S53104A Unspecified dislocation of right ulnohumeral joint, initial encounter: Secondary | ICD-10-CM | POA: Insufficient documentation

## 2015-04-20 DIAGNOSIS — S59901A Unspecified injury of right elbow, initial encounter: Secondary | ICD-10-CM | POA: Diagnosis present

## 2015-04-20 DIAGNOSIS — Y9389 Activity, other specified: Secondary | ICD-10-CM | POA: Insufficient documentation

## 2015-04-20 DIAGNOSIS — Y9289 Other specified places as the place of occurrence of the external cause: Secondary | ICD-10-CM | POA: Diagnosis not present

## 2015-04-20 MED ORDER — PROPOFOL 10 MG/ML IV BOLUS
40.0000 mg | Freq: Once | INTRAVENOUS | Status: AC
  Administered 2015-04-20: 50 mg via INTRAVENOUS
  Administered 2015-04-20: 40 mg via INTRAVENOUS
  Filled 2015-04-20: qty 20

## 2015-04-20 MED ORDER — OXYCODONE-ACETAMINOPHEN 5-325 MG PO TABS: 2.0000 | ORAL_TABLET | ORAL | Status: DC | PRN

## 2015-04-20 MED ORDER — HYDROMORPHONE HCL 1 MG/ML IJ SOLN
1.0000 mg | Freq: Once | INTRAMUSCULAR | Status: AC
  Administered 2015-04-20: 1 mg via INTRAVENOUS
  Filled 2015-04-20: qty 1

## 2015-04-20 MED ORDER — PROPOFOL 10 MG/ML IV BOLUS
40.0000 mg | Freq: Once | INTRAVENOUS | Status: AC
  Administered 2015-04-20: 50 mg via INTRAVENOUS
  Filled 2015-04-20: qty 20

## 2015-04-20 MED ORDER — OXYCODONE-ACETAMINOPHEN 5-325 MG PO TABS: 1.0000 | ORAL_TABLET | ORAL | Status: DC | PRN

## 2015-04-20 NOTE — ED Provider Notes (Addendum)
CSN: [id number]     Arrival date & time 04/20/15  1712 History   First MD Initiated Contact with Patient 04/20/15 1809     Chief Complaint  Patient presents with  . Arm Injury     (Consider location/radiation/quality/duration/timing/severity/associated sxs/prior Treatment) Patient is a 74 y.o. female presenting with extremity pain.  Extremity Pain This is a new problem. Pertinent negatives include no chest pain and no abdominal pain. Nothing aggravates the symptoms. Nothing relieves the symptoms. She has tried nothing for the symptoms. The treatment provided no relief.    Past Medical History  Diagnosis Date  . Occasional tremors    Past Surgical History  Procedure Laterality Date  . Abdominal hysterectomy    . Appendectomy    . Tonsillectomy    . Fl inj left knee ct arthrogram ([facility] hx)     History reviewed. No pertinent family history. Social History  Substance Use Topics  . Smoking status: Never Smoker   . Smokeless tobacco: None  . Alcohol Use: No   OB History    Gravida Para Term Preterm AB TAB SAB Ectopic Multiple Living            2     Review of Systems  Cardiovascular: Negative for chest pain.  Gastrointestinal: Negative for abdominal pain.  All other systems reviewed and are negative.     Allergies  Penicillins; Cephalosporins; and Prochlorperazine edisylate  Home Medications   Prior to Admission medications   Medication Sig Start Date End Date Taking? Authorizing Provider  ALPRAZolam Duanne Moron) 0.5 MG tablet TAKE ONE TABLET BY MOUTH TWICE DAILY AS NEEDED FOR ANXIETY 12/03/10  Yes Collene Leyden Bowen, DO  ibuprofen (ADVIL,MOTRIN) 200 MG tablet Take 800 mg by mouth at bedtime as needed for mild pain or moderate pain.   Yes Historical Provider, MD  mirtazapine (REMERON) 30 MG tablet Take 1 tablet (30 mg total) by mouth at bedtime. 04/14/11  Yes Hali Marry, MD  Omega-3 Fatty Acids (FISH OIL PO) Take 1 capsule by mouth daily.   Yes Historical Provider, MD   propranolol (INDERAL) 40 MG tablet Take 40 mg by mouth 2 (two) times daily.   Yes Historical Provider, MD  oxyCODONE-acetaminophen (PERCOCET/ROXICET) 5-325 MG tablet Take 2 tablets by mouth every 4 (four) hours as needed for severe pain. 04/20/15   Merrily Pew, MD  oxyCODONE-acetaminophen (PERCOCET/ROXICET) 5-325 MG tablet Take 1-2 tablets by mouth every 4 (four) hours as needed for severe pain. 04/20/15   Merrily Pew, MD   BP 135/78 mmHg  Pulse 74  Temp(Src) 97.5 F (36.4 C) (Oral)  Resp 23  Ht 5\' 8"  (1.727 m)  Wt 220 lb (99.791 kg)  BMI 33.46 kg/m2  SpO2 94% Physical Exam  Constitutional: She is oriented to person, place, and time. She appears well-developed and well-nourished.  HENT:  Head: Normocephalic and atraumatic.  Neck: Normal range of motion.  Cardiovascular: Normal rate and regular rhythm.   Intact right radial pulse prior to and after reductions.  Pulmonary/Chest: Effort normal and breath sounds normal. No stridor. No respiratory distress. She has no wheezes.  Abdominal: She exhibits no distension. There is no tenderness. There is no rebound.  Musculoskeletal: She exhibits tenderness (right elbow).  Initially unable to ROM right elbow 2/2 severe pain Also swollen and painful to the touch  Neurological: She is alert and oriented to person, place, and time. No cranial nerve deficit. Coordination normal.  Intact right hand motor and sensation. Has resting tremor but is  her baseline.   Nursing note and vitals reviewed.   ED Course  ORTHOPEDIC INJURY TREATMENT Date/Time: 04/20/2015 10:51 PM Performed by: Merrily Pew Authorized by: Merrily Pew Consent: Verbal consent obtained. Written consent obtained. Risks and benefits: risks, benefits and alternatives were discussed Consent given by: patient Patient understanding: patient states understanding of the procedure being performed Patient consent: the patient's understanding of the procedure matches consent  given Procedure consent: procedure consent matches procedure scheduled Required items: required blood products, implants, devices, and special equipment available Patient identity confirmed: verbally with patient Time out: Immediately prior to procedure a "time out" was called to verify the correct patient, procedure, equipment, support staff and site/side marked as required. Injury location: elbow Location details: right elbow Injury type: dislocation Dislocation type: posterior Pre-procedure neurovascular assessment: neurovascularly intact Pre-procedure distal perfusion: normal Pre-procedure neurological function: normal Pre-procedure range of motion: normal Local anesthesia used: no Patient sedated: yes Sedation type: moderate (conscious) sedation Sedatives: propofol Analgesia: hydromorphone Sedation start date/time: 04/20/2015 8:30 PM Sedation end date/time: 04/20/2015 9:05 PM Vitals: Vital signs were monitored during sedation. Manipulation performed: yes Reduction method: direct traction, traction and counter traction and manipulation of proximal ulna Reduction successful: no Immobilization: splint Splint type: volar short arm Supplies used: aluminum splint Post-procedure distal perfusion: normal Post-procedure neurological function: normal Post-procedure range of motion: normal Patient tolerance: Patient tolerated the procedure well with no immediate complications  ORTHOPEDIC INJURY TREATMENT Date/Time: 04/20/2015 10:54 PM Performed by: Merrily Pew Authorized by: Merrily Pew Consent: Verbal consent obtained. Written consent obtained. Risks and benefits: risks, benefits and alternatives were discussed Consent given by: patient and guardian Patient understanding: patient states understanding of the procedure being performed Patient consent: the patient's understanding of the procedure matches consent given Procedure consent: procedure consent matches procedure  scheduled Required items: required blood products, implants, devices, and special equipment available Patient identity confirmed: verbally with patient Time out: Immediately prior to procedure a "time out" was called to verify the correct patient, procedure, equipment, support staff and site/side marked as required. Injury location: elbow Location details: right elbow Injury type: dislocation Dislocation type: posterior Pre-procedure neurovascular assessment: neurovascularly intact Pre-procedure distal perfusion: normal Pre-procedure neurological function: normal Pre-procedure range of motion: normal Patient sedated: yes Sedation type: moderate (conscious) sedation Sedatives: propofol Sedation start date/time: 04/20/2015 9:20 PM Sedation end date/time: 04/20/2015 9:50 PM Vitals: Vital signs were monitored during sedation. Manipulation performed: yes Reduction method: direct traction, manipulation of proximal ulna and traction and counter traction Reduction successful: yes X-ray confirmed reduction: yes Immobilization: splint and sling Splint type: long arm Supplies used: plaster Post-procedure neurovascular assessment: post-procedure neurovascularly intact Post-procedure distal perfusion: normal Post-procedure neurological function: normal Post-procedure range of motion: normal Patient tolerance: Patient tolerated the procedure well with no immediate complications      (including critical care time) Labs Review Labs Reviewed - No data to display  Imaging Review Dg Shoulder Right  04/20/2015  CLINICAL DATA:  Fall with right shoulder pain. EXAM: RIGHT SHOULDER - 2+ VIEW COMPARISON:  None. FINDINGS: The right upper extremity is internally rotated on the AP view. No right shoulder fracture, Hill-Sachs deformity, dislocation or suspicious focal osseous lesion is evident on these views. Mild osteoarthritis in the right acromioclavicular joint. Right glenohumeral joint appears normal.  No pathologic soft tissue calcifications. IMPRESSION: Negative right shoulder radiographs. Electronically Signed   By: Ilona Sorrel M.D.   On: 04/20/2015 18:00   Dg Elbow 2 Views Right  04/20/2015  CLINICAL DATA:  Postreduction right elbow.  Initial encounter. EXAM: RIGHT ELBOW - 2 VIEW COMPARISON:  Earlier today FINDINGS: Limited AP view but relocated elbow in the lateral projection. No appreciable fracture. IMPRESSION: 1. Relocated elbow in the lateral projection. 2. Nondiagnostic AP view. Electronically Signed   By: Monte Fantasia M.D.   On: 04/20/2015 22:25   Dg Elbow 2 Views Right  04/20/2015  CLINICAL DATA:  Interval casting right elbow dislocation. EXAM: RIGHT ELBOW - 2 VIEW COMPARISON:  Elbow radiograph earlier same day FINDINGS: Persistent posterior dislocation at the right elbow joint. Small calcific density demonstrated on lateral view adjacent to the radial head may represent small avulsion injury/ fracture of the coronoid process. Overlying soft tissue swelling. IMPRESSION: Persistent posterior dislocation at the elbow joint. Possible fracture at the coronoid process of the proximal ulna. Electronically Signed   By: Lovey Newcomer M.D.   On: 04/20/2015 21:32   Dg Elbow Complete Right  04/20/2015  CLINICAL DATA:  Tripped over a stool and fell onto RIGHT arm, states "I think I dislocated my RIGHT elbow' EXAM: RIGHT ELBOW - COMPLETE 2 VIEWS COMPARISON:  None FINDINGS: Osseous mineralization normal. Radial and posterior RIGHT elbow dislocation. No definite acute fracture fragments identified. Associated soft tissue deformity. IMPRESSION: Dorsolateral RIGHT elbow dislocation without definite fracture. Electronically Signed   By: Lavonia Dana M.D.   On: 04/20/2015 18:01   I have personally reviewed and evaluated these images and lab results as part of my medical decision-making.   EKG Interpretation None      MDM   Final diagnoses:  Elbow dislocation, right, initial encounter   74 year old  female who had a mechanical fall hitting her right elbow with persistent pain and deformity since that time. Neurovascularly intact on arrival however cannot range of motion secondary to pain. Attempted reduction twice the second time was successful. Patient's elbow relatively unstable during the reduction. However after this reduction and put in a splint and a sling repeat x-ray appeared to be normal. Second set of x-rays there is a query of a possible ulnar coronoid fracture   3rd xr with normal alignment. D/w orthopedics, Dr. Aline Brochure, about stability of elbow and he will see her in office Thursday, she will make an appointment. Pain controlled at time of discharge, pre-pack given. Strict precautions to keep arm as immobilized as possible.   I have personally and contemperaneously reviewed labs and imaging and used in my decision making as above.   A medical screening exam was performed and I feel the patient has had an appropriate workup for their chief complaint at this time and likelihood of emergent condition existing is low. They have been counseled on decision, discharge, follow up and which symptoms necessitate immediate return to the emergency department. They or their family verbally stated understanding and agreement with plan and discharged in stable condition.     Merrily Pew, MD 05/11/15 1159

## 2015-04-20 NOTE — ED Notes (Signed)
PT stated she tripped over a stool and fell onto her right arm and states " I think I dislocated my right elbow and my arm was in an awkward direction and I put it back in place." Pulses and sensation present with good color and temperature.

## 2015-04-20 NOTE — ED Notes (Signed)
MD Mesner at bedside  °

## 2015-04-20 NOTE — ED Notes (Signed)
Pt states understanding of care given and follow up instructions.  Was able to ambulate from ED

## 2015-04-21 DIAGNOSIS — S53104A Unspecified dislocation of right ulnohumeral joint, initial encounter: Secondary | ICD-10-CM | POA: Diagnosis not present

## 2015-04-22 DIAGNOSIS — R600 Localized edema: Secondary | ICD-10-CM | POA: Diagnosis not present

## 2015-04-22 DIAGNOSIS — M25421 Effusion, right elbow: Secondary | ICD-10-CM | POA: Diagnosis not present

## 2015-04-22 DIAGNOSIS — S42131A Displaced fracture of coracoid process, right shoulder, initial encounter for closed fracture: Secondary | ICD-10-CM | POA: Diagnosis not present

## 2015-04-23 DIAGNOSIS — S53104D Unspecified dislocation of right ulnohumeral joint, subsequent encounter: Secondary | ICD-10-CM | POA: Diagnosis not present

## 2015-04-26 MED FILL — Hydrocodone-Acetaminophen Tab 5-325 MG: ORAL | Qty: 6 | Status: AC

## 2015-04-28 DIAGNOSIS — S53104D Unspecified dislocation of right ulnohumeral joint, subsequent encounter: Secondary | ICD-10-CM | POA: Diagnosis not present

## 2015-05-12 DIAGNOSIS — S53104D Unspecified dislocation of right ulnohumeral joint, subsequent encounter: Secondary | ICD-10-CM | POA: Diagnosis not present

## 2015-06-09 DIAGNOSIS — S53104D Unspecified dislocation of right ulnohumeral joint, subsequent encounter: Secondary | ICD-10-CM | POA: Diagnosis not present

## 2015-06-10 ENCOUNTER — Ambulatory Visit (HOSPITAL_COMMUNITY): Payer: Medicare Other | Attending: Obstetrics & Gynecology

## 2015-06-10 DIAGNOSIS — M25621 Stiffness of right elbow, not elsewhere classified: Secondary | ICD-10-CM | POA: Diagnosis not present

## 2015-06-10 DIAGNOSIS — R29898 Other symptoms and signs involving the musculoskeletal system: Secondary | ICD-10-CM | POA: Diagnosis not present

## 2015-06-10 DIAGNOSIS — S53105S Unspecified dislocation of left ulnohumeral joint, sequela: Secondary | ICD-10-CM | POA: Diagnosis not present

## 2015-06-10 DIAGNOSIS — X58XXXS Exposure to other specified factors, sequela: Secondary | ICD-10-CM | POA: Insufficient documentation

## 2015-06-10 NOTE — Therapy (Signed)
[place], [place], [address] Phone: [phone]   Fax:  [phone]  Occupational Therapy Evaluation  Patient Details  Name: [name] Brown MRN: [id number] Date of Birth: 02-Mar-1941 Referring Provider: Malena Catholic  Encounter Date: 06/10/2015      OT End of Session - 06/10/15 1851    Visit Number 1   Number of Visits 1   Authorization Type Medicare   Authorization Time Period before 10th visit   Authorization - Visit Number 1   Authorization - Number of Visits 10   OT Start Time [id number]   OT Stop Time 1815   OT Time Calculation (min) 45 min   Activity Tolerance Patient tolerated treatment well   Behavior During Therapy [facility] for tasks assessed/performed      Past Medical History  Diagnosis Date  . Occasional tremors     Past Surgical History  Procedure Laterality Date  . Abdominal hysterectomy    . Appendectomy    . Tonsillectomy    . Fl inj left knee ct arthrogram ([facility] hx)      There were no vitals filed for this visit.  Visit Diagnosis:  Elbow dislocation, left, sequela  Right arm weakness  Elbow stiffness, right      Subjective Assessment - 06/10/15 1843    Subjective  S: I can unhook my bra and move it all around ok.    Pertinent History Patient is a 74 y/o female S/P right elbow dislocation which occured after a fall on 04/20/15 in which patient tripped over stool and tried to catch herself. No surgery was completed. Dr. Tamera Punt has referred patient to occupational therapy for evaluation and treatment.    Special Tests FOTO score: 89/100   Patient Stated Goals To strengthening her elbow to prevent another dislocation in the future.    Currently in Pain? Yes   Pain Score 1    Pain Location Elbow   Pain Orientation Right   Pain Descriptors / Indicators Tender   Pain Type Acute pain   Pain Onset More than a month ago   Pain Frequency Rarely           [facility] OT Assessment -  06/10/15 1733    Assessment   Diagnosis Right elbow dislocation   Referring Provider Malena Catholic   Onset Date 04/20/15   Prior Therapy none   Precautions   Precautions Other (comment)   Precaution Comments No lifting over 10lbs.    Restrictions   Weight Bearing Restrictions Yes   Other Position/Activity Restrictions No weightbearing over 10lbs.   Balance Screen   Has the patient fallen in the past 6 months Yes   How many times? 1   Has the patient had a decrease in activity level because of a fear of falling?  No   Is the patient reluctant to leave their home because of a fear of falling?  No   Home  Environment   Family/patient expects to be discharged to: Private residence   Prior Function   Level of [place] Retired   Leisure Patient spends time with grandchildren and family.   ADL   ADL comments Pt reports no difficulty using Right UE for daily tasks.   Mobility   Mobility Status History of falls   Written Expression   Dominant Hand Right   Vision - History   Baseline Vision Wears glasses all the time   Cognition   Overall Cognitive  Status Within Functional Limits for tasks assessed   Observation/Other Assessments   Observations Mild edema noted in right medial elbow region.   Coordination   Tremors Resting tremot noted which is baseline.   ROM / Strength   AROM / PROM / Strength AROM;PROM;Strength   Palpation   Palpation comment No fascial restrictions noted in RUE elbow region.   AROM   Overall AROM Comments Assessed seated.    AROM Assessment Site Elbow;Forearm;Wrist   Right/Left Elbow Right   Right Elbow Flexion 145   Right Elbow Extension --  9 degrees from zero.   Right/Left Forearm Right   Right Forearm Pronation 90 Degrees   Right Forearm Supination 90 Degrees   Right/Left Wrist Right   PROM   Overall PROM Comments Assessed seated.   PROM Assessment Site Elbow;Forearm;Wrist   Right/Left Elbow Right   Right Elbow  Extension 0   Strength   Overall Strength Comments Assessed seated.   Strength Assessment Site Elbow;Forearm;Wrist;Hand   Right/Left Elbow Right   Right Elbow Flexion 4+/5   Right Elbow Extension 5/5   Right/Left Forearm Right   Right Forearm Pronation 4+/5   Right Forearm Supination 5/5   Right/Left hand Right;Left   Right Hand Grip (lbs) 45   Left Hand Grip (lbs) 55                         OT Education - 06/26/2015 1850    Education provided Yes   Education Details elbow and supination strengthening. Elbow extension stretch   Person(s) Educated Patient   Methods Explanation;Demonstration;Verbal cues;Handout;Tactile cues   Comprehension Verbalized understanding;Returned demonstration          OT Short Term Goals - 2015/06/26 1854    OT SHORT TERM GOAL #1   Title patient will be educated and independent with HEP to increase functional mobility and strength in RUE.    Time 1   Period Days   Status Achieved                  Plan - 26-Jun-2015 1852    Clinical Impression Statement A: Patient is a 74 y/o female S/P right elbow dislocation. Patient reports that she was sent to therapy to work on strengthening her elbow to prevent another dislocation if the future. After discussion of different options patient chose to complete HEP independently at home. Patient presents with overall great ROM and strength with mild limitation noted in elbow extension ROM, elbow extension and supination strength.    Pt will benefit from skilled therapeutic intervention in order to improve on the following deficits (Retired) Decreased strength;Decreased range of motion   Rehab Potential Excellent   OT Frequency One time visit   OT Treatment/Interventions Patient/family education   Plan P: One time visit with HEP.          G-Codes - 2015/06/26 1851    Functional Assessment Tool Used FOTO score: 89/100 (11% impaired)   Functional Limitation Carrying, moving and handling  objects   Carrying, Moving and Handling Objects Current Status [id number]) At least 1 percent but less than 20 percent impaired, limited or restricted   Carrying, Moving and Handling Objects Goal Status [id number]) At least 1 percent but less than 20 percent impaired, limited or restricted   Carrying, Moving and Handling Objects Discharge Status [id number]) At least 1 percent but less than 20 percent impaired, limited or restricted      Problem List Patient Active Problem List  Diagnosis Date Noted  . LOM 06/27/2010  . ACUTE BRONCHITIS 06/27/2010  . UTI 04/28/2009  . DM 04/23/2009  . GERD 04/23/2009  . RENAL INSUFFICIENCY 04/23/2009  . DYSPNEA 04/23/2009  . TRANSIENT ISCHEMIC ATTACK 07/21/2008  . RESTING TREMOR 07/21/2008  . INSOMNIA, CHRONIC 07/08/2007  . Actinic keratosis 01/15/2007  . OSTEOPENIA 10/25/2006  . HYPOTHYROIDISM, UNSPECIFIED 03/20/2006  . HYPERLIPIDEMIA 03/20/2006  . OBESITY, NOS 03/20/2006  . DEPRESSION, MAJOR, RECURRENT 03/20/2006  . ANXIETY 03/20/2006    Ailene Ravel, OTR/L,CBIS  [phone]  06/10/2015, 7:01 PM  [place] [address] [place], [place], [address] Phone: [phone]   Fax:  [phone]  Name: [name] Brown MRN: [id number] Date of Birth: 1940-07-06

## 2015-06-10 NOTE — Patient Instructions (Signed)
Use Ice for pain. Place an ice pack on elbow with elbow elevated above heart if possible. Ice on for 15-20 minutes then remove for 15-20 minutes and repeat as needed.   Complete edema massage to elbow when ice is remove. Start at forearm and push fluid up into arm. Spend about 5-10 minutes.  Elbow extension stretch  Place closed fist on edge of table with thumb up, place opposite hand above elbow to stabilize. With body weight, slowly stretch the elbow to straighten it.   Hold 10 seconds. Complete 3 times.   Strengthening exercises - Complete 1 set. 10-15 times.   ELASTIC BAND BICEPS CURLS  With your arm at your side holding an elastic band, draw up your hand by bending at the elbow.   Keep your palm face up the entire time.       ELASTIC BAND TRICEP  Start with your elbow bent and holding an elastic band as shown. Pull the elastic band downward as you extend your elbow.   Keep your elbow by your side the entire time.       ELASTIC BAND TRICEPS - SELF FIXATION  Start by holding an elastic band across your chest with the unaffected arm.   Next, pull the band downward with the other arm so that the elbow goes from a bent position to a straightened position as shown.    ELASTIC BAND WRIST SUPINATION  While holding an elastic band and resting your arm on your thigh or table, turn your affected wrist towards palm face up.

## 2015-07-07 DIAGNOSIS — S53104D Unspecified dislocation of right ulnohumeral joint, subsequent encounter: Secondary | ICD-10-CM | POA: Diagnosis not present

## 2015-10-07 DIAGNOSIS — E785 Hyperlipidemia, unspecified: Secondary | ICD-10-CM | POA: Diagnosis not present

## 2015-10-07 DIAGNOSIS — R7301 Impaired fasting glucose: Secondary | ICD-10-CM | POA: Diagnosis not present

## 2015-10-11 DIAGNOSIS — E782 Mixed hyperlipidemia: Secondary | ICD-10-CM | POA: Diagnosis not present

## 2015-10-11 DIAGNOSIS — N182 Chronic kidney disease, stage 2 (mild): Secondary | ICD-10-CM | POA: Diagnosis not present

## 2015-10-11 DIAGNOSIS — R251 Tremor, unspecified: Secondary | ICD-10-CM | POA: Diagnosis not present

## 2015-10-11 DIAGNOSIS — R7301 Impaired fasting glucose: Secondary | ICD-10-CM | POA: Diagnosis not present

## 2015-10-12 ENCOUNTER — Other Ambulatory Visit (HOSPITAL_COMMUNITY): Payer: Self-pay | Admitting: Internal Medicine

## 2015-10-12 DIAGNOSIS — Z1231 Encounter for screening mammogram for malignant neoplasm of breast: Secondary | ICD-10-CM

## 2015-10-25 ENCOUNTER — Ambulatory Visit (HOSPITAL_COMMUNITY): Admission: RE | Admit: 2015-10-25 | Payer: Medicare Other | Source: Ambulatory Visit

## 2015-10-25 ENCOUNTER — Ambulatory Visit (HOSPITAL_COMMUNITY)
Admission: RE | Admit: 2015-10-25 | Discharge: 2015-10-25 | Disposition: A | Discharge status: Home / Self Care | Payer: Medicare Other | Source: Ambulatory Visit | Attending: Internal Medicine | Admitting: Internal Medicine

## 2015-10-25 DIAGNOSIS — Z1231 Encounter for screening mammogram for malignant neoplasm of breast: Secondary | ICD-10-CM | POA: Diagnosis not present

## 2015-10-27 ENCOUNTER — Other Ambulatory Visit: Payer: Self-pay | Admitting: Internal Medicine

## 2015-10-27 DIAGNOSIS — R928 Other abnormal and inconclusive findings on diagnostic imaging of breast: Secondary | ICD-10-CM

## 2015-11-09 ENCOUNTER — Ambulatory Visit (HOSPITAL_COMMUNITY)
Admission: RE | Admit: 2015-11-09 | Discharge: 2015-11-09 | Disposition: A | Discharge status: Home / Self Care | Payer: Medicare Other | Source: Ambulatory Visit | Attending: Internal Medicine | Admitting: Internal Medicine

## 2015-11-09 DIAGNOSIS — C50911 Malignant neoplasm of unspecified site of right female breast: Secondary | ICD-10-CM | POA: Diagnosis not present

## 2015-11-09 DIAGNOSIS — R928 Other abnormal and inconclusive findings on diagnostic imaging of breast: Secondary | ICD-10-CM

## 2015-11-09 DIAGNOSIS — N63 Unspecified lump in breast: Secondary | ICD-10-CM | POA: Diagnosis not present

## 2015-11-09 DIAGNOSIS — R922 Inconclusive mammogram: Secondary | ICD-10-CM | POA: Diagnosis not present

## 2016-01-04 DIAGNOSIS — H524 Presbyopia: Secondary | ICD-10-CM | POA: Diagnosis not present

## 2016-01-04 DIAGNOSIS — H52223 Regular astigmatism, bilateral: Secondary | ICD-10-CM | POA: Diagnosis not present

## 2016-01-04 DIAGNOSIS — H5213 Myopia, bilateral: Secondary | ICD-10-CM | POA: Diagnosis not present

## 2016-04-17 DIAGNOSIS — I10 Essential (primary) hypertension: Secondary | ICD-10-CM | POA: Diagnosis not present

## 2016-04-17 DIAGNOSIS — E782 Mixed hyperlipidemia: Secondary | ICD-10-CM | POA: Diagnosis not present

## 2016-04-17 DIAGNOSIS — R7301 Impaired fasting glucose: Secondary | ICD-10-CM | POA: Diagnosis not present

## 2016-04-19 DIAGNOSIS — R251 Tremor, unspecified: Secondary | ICD-10-CM | POA: Diagnosis not present

## 2016-04-19 DIAGNOSIS — R7301 Impaired fasting glucose: Secondary | ICD-10-CM | POA: Diagnosis not present

## 2016-04-19 DIAGNOSIS — N182 Chronic kidney disease, stage 2 (mild): Secondary | ICD-10-CM | POA: Diagnosis not present

## 2016-04-19 DIAGNOSIS — E782 Mixed hyperlipidemia: Secondary | ICD-10-CM | POA: Diagnosis not present

## 2016-04-19 DIAGNOSIS — Z0000 Encounter for general adult medical examination without abnormal findings: Secondary | ICD-10-CM | POA: Diagnosis not present

## 2016-05-02 DIAGNOSIS — J060 Acute laryngopharyngitis: Secondary | ICD-10-CM | POA: Diagnosis not present

## 2016-05-02 DIAGNOSIS — R05 Cough: Secondary | ICD-10-CM | POA: Diagnosis not present

## 2016-07-05 DIAGNOSIS — L02213 Cutaneous abscess of chest wall: Secondary | ICD-10-CM | POA: Diagnosis not present

## 2016-07-06 DIAGNOSIS — L723 Sebaceous cyst: Secondary | ICD-10-CM | POA: Diagnosis not present

## 2016-07-06 NOTE — H&P (Signed)
  NTS SOAP Note  Vital Signs:  Vitals as of: 0000000: Systolic 123456: Diastolic 84: Heart Rate 66: Temp 97.57F (Temporal): Height 29ft 8in: Weight 215Lbs 0 Ounces: Pain Level 3: BMI 32.69   BMI : 32.69 kg/m2  Subjective: This 76 year old female presents for of an enlarging cyst on her chest wall. She has been referred by Dr. Nevada Crane. She states has been present for several months. It is tender to touch. Her pain level is 3 out of 10. No drainage has been noted. She just finished a round of antibiotics. She has not had a recent mammogram as it is in close proximity to the right breast. Her mother had bilateral mastectomy for an unknown breast condition in the remote past.  Review of Symptoms:  Constitutional:negative Head:negative Eyes:negative Nose/Mouth/Throat:negative Cardiovascular:negative Respiratory:negative Gastrointestinnegative Genitourinary:negative Musculoskeletal:negative as above as above Hematolgic/Lymphatic:negative Allergic/Immunologic:negative   Past Medical History:Reviewed  Past Medical History   hypertension   Social History:Reviewed  Social History  Preferred Language: English Race:  White Ethnicity: Not Hispanic / Latino Age: 62 year Marital Status:  S Alcohol: no   Smoking Status: Never smoker reviewed on 07/06/2016 Functional Status reviewed on 07/06/2016 ------------------------------------------------ Bathing: Normal Cooking: Normal Dressing: Normal Driving: Normal Eating: Normal Managing Meds: Normal Oral Care: Normal Shopping: Normal Toileting: Normal Transferring: Normal Walking: Normal Cognitive Status reviewed on 07/06/2016 ------------------------------------------------ Attention: Normal Decision Making: Normal Language: Normal Memory: Normal Motor: Normal Perception: Normal Problem Solving: Normal Visual and Spatial: Normal   Family History:Reviewed  Family Health History Mother, Deceased;  Hypertension (high blood pressure); Breast tumor (benign or malignant);  Father, Deceased; Lung cancer;     Objective Information: General:Well appearing, well nourished in no distress. 3 cm fluctuant cystic lesion noted with induration and erythema on the chest wall, just superior to the right breast. No drainage noted. Tender to touch. Head:Atraumatic; no masses; no abnormalities Neck:Supple without lymphadenopathy.  Heart:RRR, no murmur or gallop.  Normal S1, S2.  No S3, S4.  Lungs:CTA bilaterally, no wheezes, rhonchi, rales.  Breathing unlabored. no dominant mass, nipple discharge, dimpling in right breast. The axilla is negative for palpable nodes. Dr. Juel Burrow notes reviewed Assessment:infected sebaceous cyst, chest wall  Diagnoses: 706.2  L72.3 Epidermoid cyst of skin (Sebaceous cyst)  Procedures: [id number] - OFFICE OUTPATIENT NEW 30 MINUTES    Plan:  scheduled for excision of sebaceous cyst, chest wall on 07/10/2016.   Patient Education:Alternative treatments to surgery were discussed with patient (and family).Risks and benefits  of procedure including bleeding, infection, and wound breakdown were fully explained to the patient (and family) who gave informed consent. Patient/family questions were addressed.  Follow-up:Pending Surgery

## 2016-07-06 NOTE — Patient Instructions (Signed)
[name] Brown  07/06/2016     @PREFPERIOPPHARMACY @   Your procedure is scheduled on  07/10/2016   Report to [facility] at  710  A.M.  Call this number if you have problems the morning of surgery:  [phone]   Remember:  Do not eat food or drink liquids after midnight.  Take these medicines the morning of surgery with A SIP OF WATER  Xanax, inderal.   Do not wear jewelry, make-up or nail polish.  Do not wear lotions, powders, or perfumes, or deoderant.  Do not shave 48 hours prior to surgery.  Men may shave face and neck.  Do not bring valuables to the hospital.  [facility] is not responsible for any belongings or valuables.  Contacts, dentures or bridgework may not be worn into surgery.  Leave your suitcase in the car.  After surgery it may be brought to your room.  For patients admitted to the hospital, discharge time will be determined by your treatment team.  Patients discharged the day of surgery will not be allowed to drive home.   Name and phone number of your driver:   family Special instructions:  None  Please read over the following fact sheets that you were given. Anesthesia Post-op Instructions and Care and Recovery After Surgery      Excision of Skin Lesions Introduction Excision of a skin lesion refers to the removal of a section of skin by making small cuts (incisions) in the skin. This procedure may be done to remove a cancerous (malignant) or noncancerous (benign) growth on the skin. It is typically done to treat or prevent cancer or infection. It may also be done to improve cosmetic appearance. The procedure may be done to remove:  Cancerous growths, such as basal cell carcinoma, squamous cell carcinoma, or melanoma.  Noncancerous growths, such as a cyst or lipoma.  Growths, such as moles or skin tags, which may be removed for cosmetic reasons. Various excision or surgical techniques may be used depending on your condition, the  location of the lesion, and your overall health. Tell a health care provider about:  Any allergies you have.  All medicines you are taking, including vitamins, herbs, eye drops, creams, and over-the-counter medicines.  Any problems you or family members have had with anesthetic medicines.  Any blood disorders you have.  Any surgeries you have had.  Any medical conditions you have.  Whether you are pregnant or may be pregnant. What are the risks? Generally, this is a safe procedure. However, problems may occur, including:  Bleeding.  Infection.  Scarring.  Recurrence of the cyst, lipoma, or cancer.  Changes in skin sensation or appearance, such as discoloration or swelling.  Reaction to the anesthetics.  Allergic reaction to surgical materials or ointments.  Damage to nerves, blood vessels, muscles, or other structures.  Continued pain. What happens before the procedure?  Ask your health care provider about:  Changing or stopping your regular medicines. This is especially important if you are taking diabetes medicines or blood thinners.  Taking medicines such as aspirin and ibuprofen. These medicines can thin your blood. Do not take these medicines before your procedure if your health care provider instructs you not to.  You may be asked to take certain medicines.  You may be asked to stop smoking.  You may have an exam or testing.  Plan to have someone take you home after the procedure.  Plan  to have someone help you with activities during recovery. What happens during the procedure?  To reduce your risk of infection:  Your health care team will wash or sanitize their hands.  Your skin will be washed with soap.  You will be given a medicine to numb the area (local anesthetic).  One of the following excision techniques will be performed.  At the end of any of these procedures, antibiotic ointment will be applied as needed. Each of the following  techniques may vary among health care providers and hospitals. Complete Surgical Excision  The area of skin that needs to be removed will be marked with a pen. Using a small scalpel or scissors, the surgeon will gently cut around and under the lesion until it is completely removed. The lesion will be placed in a fluid and sent to the lab for examination. If necessary, bleeding will be controlled with a device that delivers heat (electrocautery). The edges of the wound may be stitched (sutured) together, and a bandage (dressing) will be applied. This procedure may be performed to treat a cancerous growth or a noncancerous cyst or lesion. Excision of a Cyst  The surgeon will make an incision on the cyst. The entire cyst will be removed through the incision. The incision may be closed with sutures. Shave Excision  During shave excision, the surgeon will use a small blade or an electrically heated loop instrument to shave off the lesion. This may be done to remove a mole or a skin tag. The wound will usually be left to heal on its own without sutures. Punch Excision  During punch excision, the surgeon will use a small tool that is like a cookie cutter or a hole punch to cut a circle shape out of the skin. The outer edges of the skin will be sutured together. This may be done to remove a mole or a scar or to perform a biopsy of the lesion. Mohs Micrographic Surgery  During Mohs micrographic surgery, layers of the lesion will be removed with a scalpel or a loop instrument and will be examined right away under a microscope. Layers will be removed until all of the abnormal or cancerous tissue has been removed. This procedure is minimally invasive, and it ensures the best cosmetic outcome. It involves the removal of as little normal tissue as possible. Mohs is usually done to treat skin cancer, such as basal cell carcinoma or squamous cell carcinoma, particularly on the face and ears. Depending on the size of the  surgical wound, it may be sutured closed. What happens after the procedure?  Return to your normal activities as told by your health care provider.  Talk with your health care provider to discuss any test results, treatment options, and if necessary, the need for more tests. This information is not intended to replace advice given to you by your health care provider. Make sure you discuss any questions you have with your health care provider. Document Released: 08/23/2009 Document Revised: 11/04/2015 Document Reviewed: 07/15/2014  2017 Elsevier Excision of Skin Lesions, Care After Introduction Refer to this sheet in the next few weeks. These instructions provide you with information about caring for yourself after your procedure. Your health care provider may also give you more specific instructions. Your treatment has been planned according to current medical practices, but problems sometimes occur. Call your health care provider if you have any problems or questions after your procedure. What can I expect after the procedure? After your procedure,  it is common to have pain or discomfort at the excision site. Follow these instructions at home:  Take over-the-counter and prescription medicines only as told by your health care provider.  Follow instructions from your health care provider about:  How to take care of your excision site. You should keep the site clean, dry, and protected for at least 48 hours.  When and how you should change your bandage (dressing).  When you should remove your dressing.  Removing whatever was used to close your excision site.  Check the excision area every day for signs of infection. Watch for:  Redness, swelling, or pain.  Fluid, blood, or pus.  For bleeding, apply gentle but firm pressure to the area using a folded towel for 20 minutes.  Avoid high-impact exercise and activities until the stitches (sutures) are removed or the area heals.  Follow  instructions from your health care provider about how to minimize scarring. Avoid sun exposure until the area has healed. Scarring should lessen over time.  Keep all follow-up visits as told by your health care provider. This is important. Contact a health care provider if:  You have a fever.  You have redness, swelling, or pain at the excision site.  You have fluid, blood, or pus coming from the excision site.  You have ongoing bleeding at the excision site.  You have pain that does not improve in 2-3 days after your procedure.  You notice skin irregularities or changes in sensation. This information is not intended to replace advice given to you by your health care provider. Make sure you discuss any questions you have with your health care provider. Document Released: 10/13/2014 Document Revised: 11/04/2015 Document Reviewed: 07/15/2014  2017 Elsevier PATIENT INSTRUCTIONS POST-ANESTHESIA  IMMEDIATELY FOLLOWING SURGERY:  Do not drive or operate machinery for the first twenty four hours after surgery.  Do not make any important decisions for twenty four hours after surgery or while taking narcotic pain medications or sedatives.  If you develop intractable nausea and vomiting or a severe headache please notify your doctor immediately.  FOLLOW-UP:  Please make an appointment with your surgeon as instructed. You do not need to follow up with anesthesia unless specifically instructed to do so.  WOUND CARE INSTRUCTIONS (if applicable):  Keep a dry clean dressing on the anesthesia/puncture wound site if there is drainage.  Once the wound has quit draining you may leave it open to air.  Generally you should leave the bandage intact for twenty four hours unless there is drainage.  If the epidural site drains for more than 36-48 hours please call the anesthesia department.  QUESTIONS?:  Please feel free to call your physician or the hospital operator if you have any questions, and they will be  happy to assist you.

## 2016-07-07 ENCOUNTER — Other Ambulatory Visit: Payer: Self-pay

## 2016-07-07 ENCOUNTER — Encounter (HOSPITAL_COMMUNITY)
Admission: RE | Admit: 2016-07-07 | Discharge: 2016-07-07 | Disposition: A | Discharge status: Home / Self Care | Payer: Medicare Other | Source: Ambulatory Visit | Attending: General Surgery | Admitting: General Surgery

## 2016-07-07 ENCOUNTER — Encounter (HOSPITAL_COMMUNITY): Payer: Self-pay

## 2016-07-07 DIAGNOSIS — I10 Essential (primary) hypertension: Secondary | ICD-10-CM

## 2016-07-07 DIAGNOSIS — F418 Other specified anxiety disorders: Secondary | ICD-10-CM | POA: Diagnosis not present

## 2016-07-07 DIAGNOSIS — Z01812 Encounter for preprocedural laboratory examination: Secondary | ICD-10-CM

## 2016-07-07 DIAGNOSIS — L720 Epidermal cyst: Secondary | ICD-10-CM | POA: Diagnosis not present

## 2016-07-07 DIAGNOSIS — Z79899 Other long term (current) drug therapy: Secondary | ICD-10-CM | POA: Diagnosis not present

## 2016-07-07 DIAGNOSIS — Z01810 Encounter for preprocedural cardiovascular examination: Secondary | ICD-10-CM

## 2016-07-07 DIAGNOSIS — L723 Sebaceous cyst: Secondary | ICD-10-CM

## 2016-07-07 HISTORY — DX: Major depressive disorder, single episode, unspecified: F32.9

## 2016-07-07 HISTORY — DX: Depression, unspecified: F32.A

## 2016-07-07 HISTORY — DX: Anxiety disorder, unspecified: F41.9

## 2016-07-07 LAB — BASIC METABOLIC PANEL
Anion gap: 7 (ref 5–15)
BUN: 26 mg/dL — ABNORMAL HIGH (ref 6–20)
CHLORIDE: 107 mmol/L (ref 101–111)
CO2: 26 mmol/L (ref 22–32)
Calcium: 9 mg/dL (ref 8.9–10.3)
Creatinine, Ser: 0.95 mg/dL (ref 0.44–1.00)
GFR calc non Af Amer: 58 mL/min — ABNORMAL LOW (ref >60)
Glucose, Bld: 104 mg/dL — ABNORMAL HIGH (ref 65–99)
POTASSIUM: 4.3 mmol/L (ref 3.5–5.1)
Sodium: 140 mmol/L (ref 135–145)

## 2016-07-07 LAB — CBC WITH DIFFERENTIAL/PLATELET
BASOS PCT: 1 %
Basophils Absolute: 0.1 10*3/uL (ref 0.0–0.1)
Eosinophils Absolute: 0.3 10*3/uL (ref 0.0–0.7)
Eosinophils Relative: 4 %
HEMATOCRIT: 41.8 % (ref 36.0–46.0)
HEMOGLOBIN: 13.8 g/dL (ref 12.0–15.0)
LYMPHS PCT: 28 %
Lymphs Abs: 1.8 10*3/uL (ref 0.7–4.0)
MCH: 29.9 pg (ref 26.0–34.0)
MCHC: 33 g/dL (ref 30.0–36.0)
MCV: 90.7 fL (ref 78.0–100.0)
MONOS PCT: 5 %
Monocytes Absolute: 0.3 10*3/uL (ref 0.1–1.0)
NEUTROS PCT: 62 %
Neutro Abs: 4 10*3/uL (ref 1.7–7.7)
Platelets: 255 10*3/uL (ref 150–400)
RBC: 4.61 MIL/uL (ref 3.87–5.11)
RDW: 13.9 % (ref 11.5–15.5)
WBC: 6.4 10*3/uL (ref 4.0–10.5)

## 2016-07-10 ENCOUNTER — Ambulatory Visit (HOSPITAL_COMMUNITY): Payer: Medicare Other | Admitting: Anesthesiology

## 2016-07-10 ENCOUNTER — Encounter (HOSPITAL_COMMUNITY)
Admission: RE | Disposition: A | Discharge status: Home / Self Care | Payer: Self-pay | Source: Ambulatory Visit | Attending: General Surgery

## 2016-07-10 ENCOUNTER — Encounter (HOSPITAL_COMMUNITY): Payer: Self-pay

## 2016-07-10 ENCOUNTER — Ambulatory Visit (HOSPITAL_COMMUNITY)
Admission: RE | Admit: 2016-07-10 | Discharge: 2016-07-10 | Disposition: A | Discharge status: Home / Self Care | Payer: Medicare Other | Source: Ambulatory Visit | Attending: General Surgery | Admitting: General Surgery

## 2016-07-10 DIAGNOSIS — I10 Essential (primary) hypertension: Secondary | ICD-10-CM | POA: Insufficient documentation

## 2016-07-10 DIAGNOSIS — Z79899 Other long term (current) drug therapy: Secondary | ICD-10-CM | POA: Insufficient documentation

## 2016-07-10 DIAGNOSIS — F418 Other specified anxiety disorders: Secondary | ICD-10-CM | POA: Diagnosis not present

## 2016-07-10 DIAGNOSIS — L720 Epidermal cyst: Secondary | ICD-10-CM | POA: Insufficient documentation

## 2016-07-10 DIAGNOSIS — L723 Sebaceous cyst: Secondary | ICD-10-CM | POA: Diagnosis not present

## 2016-07-10 HISTORY — PX: CYST EXCISION: SHX5701

## 2016-07-10 SURGERY — CYST REMOVAL
Anesthesia: Monitor Anesthesia Care

## 2016-07-10 MED ORDER — LIDOCAINE HCL (CARDIAC) 10 MG/ML IV SOLN
INTRAVENOUS | Status: DC | PRN
  Administered 2016-07-10: 30 mg via INTRAVENOUS

## 2016-07-10 MED ORDER — MIDAZOLAM HCL 2 MG/2ML IJ SOLN
INTRAMUSCULAR | Status: AC
  Filled 2016-07-10: qty 2

## 2016-07-10 MED ORDER — SODIUM CHLORIDE 0.9 % IJ SOLN
INTRAMUSCULAR | Status: AC
  Filled 2016-07-10: qty 10

## 2016-07-10 MED ORDER — KETOROLAC TROMETHAMINE 30 MG/ML IJ SOLN
INTRAMUSCULAR | Status: AC
  Filled 2016-07-10: qty 1

## 2016-07-10 MED ORDER — FENTANYL CITRATE (PF) 100 MCG/2ML IJ SOLN
25.0000 ug | INTRAMUSCULAR | Status: DC | PRN
  Administered 2016-07-10: 25 ug via INTRAVENOUS
  Filled 2016-07-10: qty 2

## 2016-07-10 MED ORDER — BUPIVACAINE HCL (PF) 0.5 % IJ SOLN
INTRAMUSCULAR | Status: AC
Start: 2016-07-10 — End: 2016-07-10
  Filled 2016-07-10: qty 30

## 2016-07-10 MED ORDER — BUPIVACAINE HCL 0.5 % IJ SOLN
INTRAMUSCULAR | Status: DC | PRN
  Administered 2016-07-10: 3 mL
  Administered 2016-07-10: 1 mL
  Administered 2016-07-10: 4 mL

## 2016-07-10 MED ORDER — LIDOCAINE HCL (PF) 1 % IJ SOLN
INTRAMUSCULAR | Status: AC
  Filled 2016-07-10: qty 5

## 2016-07-10 MED ORDER — VANCOMYCIN HCL IN DEXTROSE 1-5 GM/200ML-% IV SOLN
1000.0000 mg | INTRAVENOUS | Status: AC
  Administered 2016-07-10: 1000 mg via INTRAVENOUS

## 2016-07-10 MED ORDER — MIDAZOLAM HCL 5 MG/5ML IJ SOLN
INTRAMUSCULAR | Status: DC | PRN
  Administered 2016-07-10: 2 mg via INTRAVENOUS

## 2016-07-10 MED ORDER — FENTANYL CITRATE (PF) 100 MCG/2ML IJ SOLN
INTRAMUSCULAR | Status: DC | PRN
  Administered 2016-07-10: 25 ug via INTRAVENOUS

## 2016-07-10 MED ORDER — CHLORHEXIDINE GLUCONATE CLOTH 2 % EX PADS: 6.0000 | MEDICATED_PAD | Freq: Once | CUTANEOUS | Status: DC

## 2016-07-10 MED ORDER — FENTANYL CITRATE (PF) 100 MCG/2ML IJ SOLN
INTRAMUSCULAR | Status: AC
  Filled 2016-07-10: qty 2

## 2016-07-10 MED ORDER — PROPOFOL 10 MG/ML IV BOLUS
INTRAVENOUS | Status: AC
  Filled 2016-07-10: qty 20

## 2016-07-10 MED ORDER — VANCOMYCIN HCL IN DEXTROSE 1-5 GM/200ML-% IV SOLN
INTRAVENOUS | Status: AC
  Filled 2016-07-10: qty 200

## 2016-07-10 MED ORDER — ROCURONIUM BROMIDE 50 MG/5ML IV SOLN
INTRAVENOUS | Status: AC
  Filled 2016-07-10: qty 1

## 2016-07-10 MED ORDER — EPHEDRINE SULFATE 50 MG/ML IJ SOLN
INTRAMUSCULAR | Status: AC
  Filled 2016-07-10: qty 1

## 2016-07-10 MED ORDER — KETOROLAC TROMETHAMINE 30 MG/ML IJ SOLN
30.0000 mg | Freq: Once | INTRAMUSCULAR | Status: AC
  Administered 2016-07-10: 30 mg via INTRAVENOUS

## 2016-07-10 MED ORDER — ARTIFICIAL TEARS OP OINT
TOPICAL_OINTMENT | OPHTHALMIC | Status: AC
  Filled 2016-07-10: qty 3.5

## 2016-07-10 MED ORDER — DEXTROSE 5 % IV SOLN
INTRAVENOUS | Status: DC | PRN
  Administered 2016-07-10: 08:00:00 via INTRAVENOUS

## 2016-07-10 MED ORDER — POVIDONE-IODINE 10 % EX OINT
TOPICAL_OINTMENT | CUTANEOUS | Status: AC
  Filled 2016-07-10: qty 1

## 2016-07-10 MED ORDER — POVIDONE-IODINE 10 % OINT PACKET
TOPICAL_OINTMENT | CUTANEOUS | Status: DC | PRN
Start: 2016-07-10 — End: 2016-07-10
  Administered 2016-07-10: 1 via TOPICAL

## 2016-07-10 MED ORDER — PROPOFOL 500 MG/50ML IV EMUL
INTRAVENOUS | Status: DC | PRN
  Administered 2016-07-10: 50 ug/kg/min via INTRAVENOUS

## 2016-07-10 MED ORDER — SUCCINYLCHOLINE CHLORIDE 20 MG/ML IJ SOLN
INTRAMUSCULAR | Status: AC
  Filled 2016-07-10: qty 1

## 2016-07-10 MED ORDER — LACTATED RINGERS IV SOLN
INTRAVENOUS | Status: DC
  Administered 2016-07-10: 07:00:00 via INTRAVENOUS

## 2016-07-10 MED ORDER — 0.9 % SODIUM CHLORIDE (POUR BTL) OPTIME
TOPICAL | Status: DC | PRN
  Administered 2016-07-10: 1000 mL

## 2016-07-10 MED ORDER — LACTATED RINGERS IV SOLN
INTRAVENOUS | Status: DC | PRN
  Administered 2016-07-10: 1000 mL

## 2016-07-10 MED ORDER — MIDAZOLAM HCL 2 MG/2ML IJ SOLN
0.5000 mg | INTRAMUSCULAR | Status: DC | PRN
  Administered 2016-07-10: 2 mg via INTRAVENOUS

## 2016-07-10 MED ORDER — FENTANYL CITRATE (PF) 250 MCG/5ML IJ SOLN
INTRAMUSCULAR | Status: AC
  Filled 2016-07-10: qty 5

## 2016-07-10 MED ORDER — HYDROCODONE-ACETAMINOPHEN 5-325 MG PO TABS
1.0000 | ORAL_TABLET | Freq: Four times a day (QID) | ORAL | 0 refills | Status: DC | PRN
Start: 2016-07-10 — End: 2017-11-19

## 2016-07-10 MED ORDER — FENTANYL CITRATE (PF) 100 MCG/2ML IJ SOLN
25.0000 ug | INTRAMUSCULAR | Status: DC | PRN
  Administered 2016-07-10: 50 ug via INTRAVENOUS

## 2016-07-10 SURGICAL SUPPLY — 30 items
BAG HAMPER (MISCELLANEOUS) ×3 IMPLANT
CLOTH BEACON ORANGE TIMEOUT ST (SAFETY) ×3 IMPLANT
COVER LIGHT HANDLE STERIS (MISCELLANEOUS) ×6 IMPLANT
DECANTER SPIKE VIAL GLASS SM (MISCELLANEOUS) ×3 IMPLANT
DERMABOND ADVANCED (GAUZE/BANDAGES/DRESSINGS)
DERMABOND ADVANCED .7 DNX12 (GAUZE/BANDAGES/DRESSINGS) IMPLANT
DRAPE EENT ADH APERT 31X51 STR (DRAPES) ×3 IMPLANT
ELECT NEEDLE TIP 2.8 STRL (NEEDLE) IMPLANT
ELECT REM PT RETURN 9FT ADLT (ELECTROSURGICAL) ×3
ELECTRODE REM PT RTRN 9FT ADLT (ELECTROSURGICAL) ×1 IMPLANT
FORMALIN 10 PREFIL 120ML (MISCELLANEOUS) ×3 IMPLANT
GLOVE BIOGEL M 6.5 STRL (GLOVE) ×3 IMPLANT
GLOVE BIOGEL PI IND STRL 6.5 (GLOVE) ×1 IMPLANT
GLOVE BIOGEL PI IND STRL 7.0 (GLOVE) ×1 IMPLANT
GLOVE BIOGEL PI INDICATOR 6.5 (GLOVE) ×2
GLOVE BIOGEL PI INDICATOR 7.0 (GLOVE) ×2
GLOVE SURG SS PI 7.5 STRL IVOR (GLOVE) ×3 IMPLANT
GOWN STRL REUS W/TWL LRG LVL3 (GOWN DISPOSABLE) ×6 IMPLANT
KIT ROOM TURNOVER APOR (KITS) ×3 IMPLANT
MANIFOLD NEPTUNE II (INSTRUMENTS) ×3 IMPLANT
NEEDLE HYPO 25X1 1.5 SAFETY (NEEDLE) ×3 IMPLANT
NS IRRIG 1000ML POUR BTL (IV SOLUTION) ×3 IMPLANT
PACK MINOR (CUSTOM PROCEDURE TRAY) IMPLANT
PAD ARMBOARD 7.5X6 YLW CONV (MISCELLANEOUS) ×3 IMPLANT
SET BASIN LINEN APH (SET/KITS/TRAYS/PACK) ×3 IMPLANT
SPONGE GAUZE 2X2 8PLY STER LF (GAUZE/BANDAGES/DRESSINGS) ×1
SPONGE GAUZE 2X2 8PLY STRL LF (GAUZE/BANDAGES/DRESSINGS) ×2 IMPLANT
SUT PROLENE 4 0 PS 2 18 (SUTURE) ×3 IMPLANT
SYR CONTROL 10ML LL (SYRINGE) ×3 IMPLANT
TAPE MEDIFIX FOAM 3 (GAUZE/BANDAGES/DRESSINGS) ×3 IMPLANT

## 2016-07-10 NOTE — Anesthesia Preprocedure Evaluation (Signed)
Anesthesia Evaluation  Patient identified by MRN, date of birth, ID band Patient awake    Reviewed: Allergy & Precautions, NPO status , Patient's Chart, lab work & pertinent test results  Airway Mallampati: I  TM Distance: >3 FB     Dental  (+) Partial Upper, Partial Lower   Pulmonary shortness of breath and with exertion,    breath sounds clear to auscultation       Cardiovascular  Rhythm:Regular Rate:Normal     Neuro/Psych Anxiety Depression    GI/Hepatic   Endo/Other  Hypothyroidism   Renal/GU Renal Insufficiency     Musculoskeletal   Abdominal   Peds  Hematology   Anesthesia Other Findings   Reproductive/Obstetrics                             Anesthesia Physical Anesthesia Plan  ASA: III  Anesthesia Plan: MAC   Post-op Pain Management:    Induction: Intravenous  Airway Management Planned: Simple Face Mask  Additional Equipment:   Intra-op Plan:   Post-operative Plan:   Informed Consent: I have reviewed the patients History and Physical, chart, labs and discussed the procedure including the risks, benefits and alternatives for the proposed anesthesia with the patient or authorized representative who has indicated his/her understanding and acceptance.     Plan Discussed with:   Anesthesia Plan Comments:         Anesthesia Quick Evaluation

## 2016-07-10 NOTE — Anesthesia Postprocedure Evaluation (Signed)
Anesthesia Post Note  Patient: [name] Brown  Procedure(s) Performed: Procedure(s) (LRB): 3CM CYST EXCISION TRUNK (N/A)  Patient location during evaluation: PACU Anesthesia Type: MAC Level of consciousness: awake and alert and oriented Pain management: pain level controlled Vital Signs Assessment: post-procedure vital signs reviewed and stable Respiratory status: spontaneous breathing Cardiovascular status: stable Postop Assessment: no signs of nausea or vomiting Anesthetic complications: no     Last Vitals:  Vitals:   07/10/16 0724 07/10/16 0811  BP: 136/88 122/75  Pulse: 63   Resp:  17  Temp: 36.6 C (P) 36.8 C    Last Pain:  Vitals:   07/10/16 0724  TempSrc: Oral                 Kwaku Mostafa A

## 2016-07-10 NOTE — Interval H&P Note (Signed)
History and Physical Interval Note:  07/10/2016 7:13 AM  [name] Brown  has presented today for surgery, with the diagnosis of sebaceous cyst trunk  The various methods of treatment have been discussed with the patient and family. After consideration of risks, benefits and other options for treatment, the patient has consented to  Procedure(s): 3CM CYST EXCISION TRUNK (N/A) as a surgical intervention .  The patient's history has been reviewed, patient examined, no change in status, stable for surgery.  I have reviewed the patient's chart and labs.  Questions were answered to the patient's satisfaction.     Aviva Signs A

## 2016-07-10 NOTE — Discharge Instructions (Signed)
Epidermal Cyst Removal, Care After °Introduction °Refer to this sheet in the next few weeks. These instructions provide you with information about caring for yourself after your procedure. Your health care provider may also give you more specific instructions. Your treatment has been planned according to current medical practices, but problems sometimes occur. Call your health care provider if you have any problems or questions after your procedure. °What can I expect after the procedure? °After the procedure, it is common to have: °· Soreness in the area where your cyst was removed. °· Tightness or itching from your skin sutures. °Follow these instructions at home: °· Take medicines only as directed by your health care provider. °· If you were prescribed an antibiotic medicine, finish all of it even if you start to feel better. °· Use antibiotic ointment as directed by your health care provider. Follow the instructions carefully. °· There are many different ways to close and cover an incision, including stitches (sutures), skin glue, and adhesive strips. Follow your health care provider's instructions about: °¨ Incision care. °¨ Bandage (dressing) changes and removal. °¨ Incision closure removal. °· Keep the bandage (dressing) dry until your health care provider says that it can be removed. Take sponge baths only. Ask your health care provider when you can start showering or taking a bath. °· After your dressing is off, check your incision every day for signs of infection. Watch for: °¨ Redness, swelling, or pain. °¨ Fluid, blood, or pus. °· You can return to your normal activities. Do not do anything that stretches or puts pressure on your incision. °· You can return to your normal diet. °· Keep all follow-up visits as directed by your health care provider. This is important. °Contact a health care provider if: °· You have a fever. °· Your incision bleeds. °· You have redness, swelling, or pain in the incision  area. °· You have fluid, blood, or pus coming from your incision. °· Your cyst comes back after surgery. °This information is not intended to replace advice given to you by your health care provider. Make sure you discuss any questions you have with your health care provider. °Document Released: 06/19/2014 Document Revised: 11/04/2015 Document Reviewed: 02/11/2014 °© 2017 Elsevier ° °

## 2016-07-10 NOTE — Anesthesia Procedure Notes (Signed)
Procedure Name: MAC Date/Time: 07/10/2016 7:37 AM Performed by: Andree Elk, Embree Brawley A Pre-anesthesia Checklist: Patient identified, Timeout performed, Emergency Drugs available, Suction available and Patient being monitored Oxygen Delivery Method: Simple face mask

## 2016-07-10 NOTE — Transfer of Care (Signed)
Immediate Anesthesia Transfer of Care Note  Patient: [name] Brown  Procedure(s) Performed: Procedure(s): 3CM CYST EXCISION TRUNK (N/A)  Patient Location: PACU  Anesthesia Type:MAC  Level of Consciousness: awake, alert , oriented and patient cooperative  Airway & Oxygen Therapy: Patient Spontanous Breathing and Patient connected to nasal cannula oxygen  Post-op Assessment: Report given to RN and Post -op Vital signs reviewed and stable  Post vital signs: Reviewed and stable  Last Vitals:  Vitals:   07/10/16 0720 07/10/16 0724  BP:  136/88  Pulse:  63  Resp: (!) 21   Temp:  36.6 C    Last Pain:  Vitals:   07/10/16 0724  TempSrc: Oral         Complications: No apparent anesthesia complications

## 2016-07-10 NOTE — Op Note (Signed)
Patient:  [name] Brown  DOB:  March 12, 1941  MRN:  [id number]   Preop Diagnosis:  Inflamed sebaceous cyst, chest wall  Postop Diagnosis:  Same  Procedure:  Excision of 3 cm sebaceous cyst, chest wall  Surgeon:  Aviva Signs, M.D.  Anes:  Mac  Indications:  Patient is a 76 year old white female who presents with an enlarging, painful sebaceous cyst on her chest wall, just to the right of her sternum. The risks and benefits of the procedure including bleeding, infection, and the possibility of recurrence of the cyst were fully explained to the patient, who gave informed consent.  Procedure note:  The patient was placed in the supine position. After monitored anesthesia care was given, the upper chest was prepped and draped using the usual sterile technique with DuraPrep. Surgical site confirmation was performed. One percent Xylocaine was used for local anesthesia.  An incision was made over the sebaceous cyst. The cyst was excised in total without difficulty. It was disposed of. Granulomatous tissue was noted at the base of the wound and this was debrided using a curet. The bleeding was controlled using Bovie electrocautery. The wound was irrigated with normal saline. The skin was reapproximated loosely using 4-0 nylon interrupted sutures. Betadine ointment and dry sterile dressings were applied.  All tape and needle counts were correct at the end of the procedure. The patient was awakened and transferred to PACU in stable condition.  Complications:  None  EBL:  Minimal  Specimen:  None

## 2016-07-11 ENCOUNTER — Encounter (HOSPITAL_COMMUNITY): Payer: Self-pay | Admitting: General Surgery

## 2016-07-26 ENCOUNTER — Other Ambulatory Visit (HOSPITAL_COMMUNITY): Payer: Self-pay | Admitting: Internal Medicine

## 2016-07-26 DIAGNOSIS — N6489 Other specified disorders of breast: Secondary | ICD-10-CM

## 2016-07-26 DIAGNOSIS — Z09 Encounter for follow-up examination after completed treatment for conditions other than malignant neoplasm: Secondary | ICD-10-CM

## 2016-08-04 ENCOUNTER — Other Ambulatory Visit (HOSPITAL_COMMUNITY): Payer: Self-pay | Admitting: Internal Medicine

## 2016-08-04 DIAGNOSIS — N6489 Other specified disorders of breast: Secondary | ICD-10-CM

## 2016-08-04 DIAGNOSIS — Z09 Encounter for follow-up examination after completed treatment for conditions other than malignant neoplasm: Secondary | ICD-10-CM

## 2016-08-15 ENCOUNTER — Ambulatory Visit (HOSPITAL_COMMUNITY)
Admission: RE | Admit: 2016-08-15 | Discharge: 2016-08-15 | Disposition: A | Discharge status: Home / Self Care | Payer: Medicare Other | Source: Ambulatory Visit | Attending: Internal Medicine | Admitting: Internal Medicine

## 2016-08-15 DIAGNOSIS — N6489 Other specified disorders of breast: Secondary | ICD-10-CM

## 2016-08-15 DIAGNOSIS — N6001 Solitary cyst of right breast: Secondary | ICD-10-CM | POA: Diagnosis not present

## 2016-08-15 DIAGNOSIS — R922 Inconclusive mammogram: Secondary | ICD-10-CM | POA: Diagnosis not present

## 2016-08-15 DIAGNOSIS — Z09 Encounter for follow-up examination after completed treatment for conditions other than malignant neoplasm: Secondary | ICD-10-CM

## 2016-08-25 DIAGNOSIS — L570 Actinic keratosis: Secondary | ICD-10-CM | POA: Diagnosis not present

## 2016-08-25 DIAGNOSIS — L821 Other seborrheic keratosis: Secondary | ICD-10-CM | POA: Diagnosis not present

## 2016-08-25 DIAGNOSIS — L720 Epidermal cyst: Secondary | ICD-10-CM | POA: Diagnosis not present

## 2016-10-05 DIAGNOSIS — L929 Granulomatous disorder of the skin and subcutaneous tissue, unspecified: Secondary | ICD-10-CM | POA: Diagnosis not present

## 2016-10-05 DIAGNOSIS — L910 Hypertrophic scar: Secondary | ICD-10-CM | POA: Diagnosis not present

## 2016-10-11 DIAGNOSIS — R7301 Impaired fasting glucose: Secondary | ICD-10-CM | POA: Diagnosis not present

## 2016-10-11 DIAGNOSIS — E782 Mixed hyperlipidemia: Secondary | ICD-10-CM | POA: Diagnosis not present

## 2016-10-13 DIAGNOSIS — N182 Chronic kidney disease, stage 2 (mild): Secondary | ICD-10-CM | POA: Diagnosis not present

## 2016-10-13 DIAGNOSIS — R251 Tremor, unspecified: Secondary | ICD-10-CM | POA: Diagnosis not present

## 2016-10-13 DIAGNOSIS — E782 Mixed hyperlipidemia: Secondary | ICD-10-CM | POA: Diagnosis not present

## 2016-10-13 DIAGNOSIS — R7301 Impaired fasting glucose: Secondary | ICD-10-CM | POA: Diagnosis not present

## 2016-10-23 DIAGNOSIS — J060 Acute laryngopharyngitis: Secondary | ICD-10-CM | POA: Diagnosis not present

## 2016-11-13 DIAGNOSIS — Z79899 Other long term (current) drug therapy: Secondary | ICD-10-CM | POA: Diagnosis not present

## 2016-12-12 IMAGING — DX DG ELBOW COMPLETE 3+V*R*
2 series · 2 of 2 positions shown · non-contrast
Comparison: None

CLINICAL DATA: Tripped over a stool and fell onto RIGHT arm, states
"I think I dislocated my RIGHT elbow'

EXAM:
RIGHT ELBOW - COMPLETE 2 VIEWS

[elbow obl]
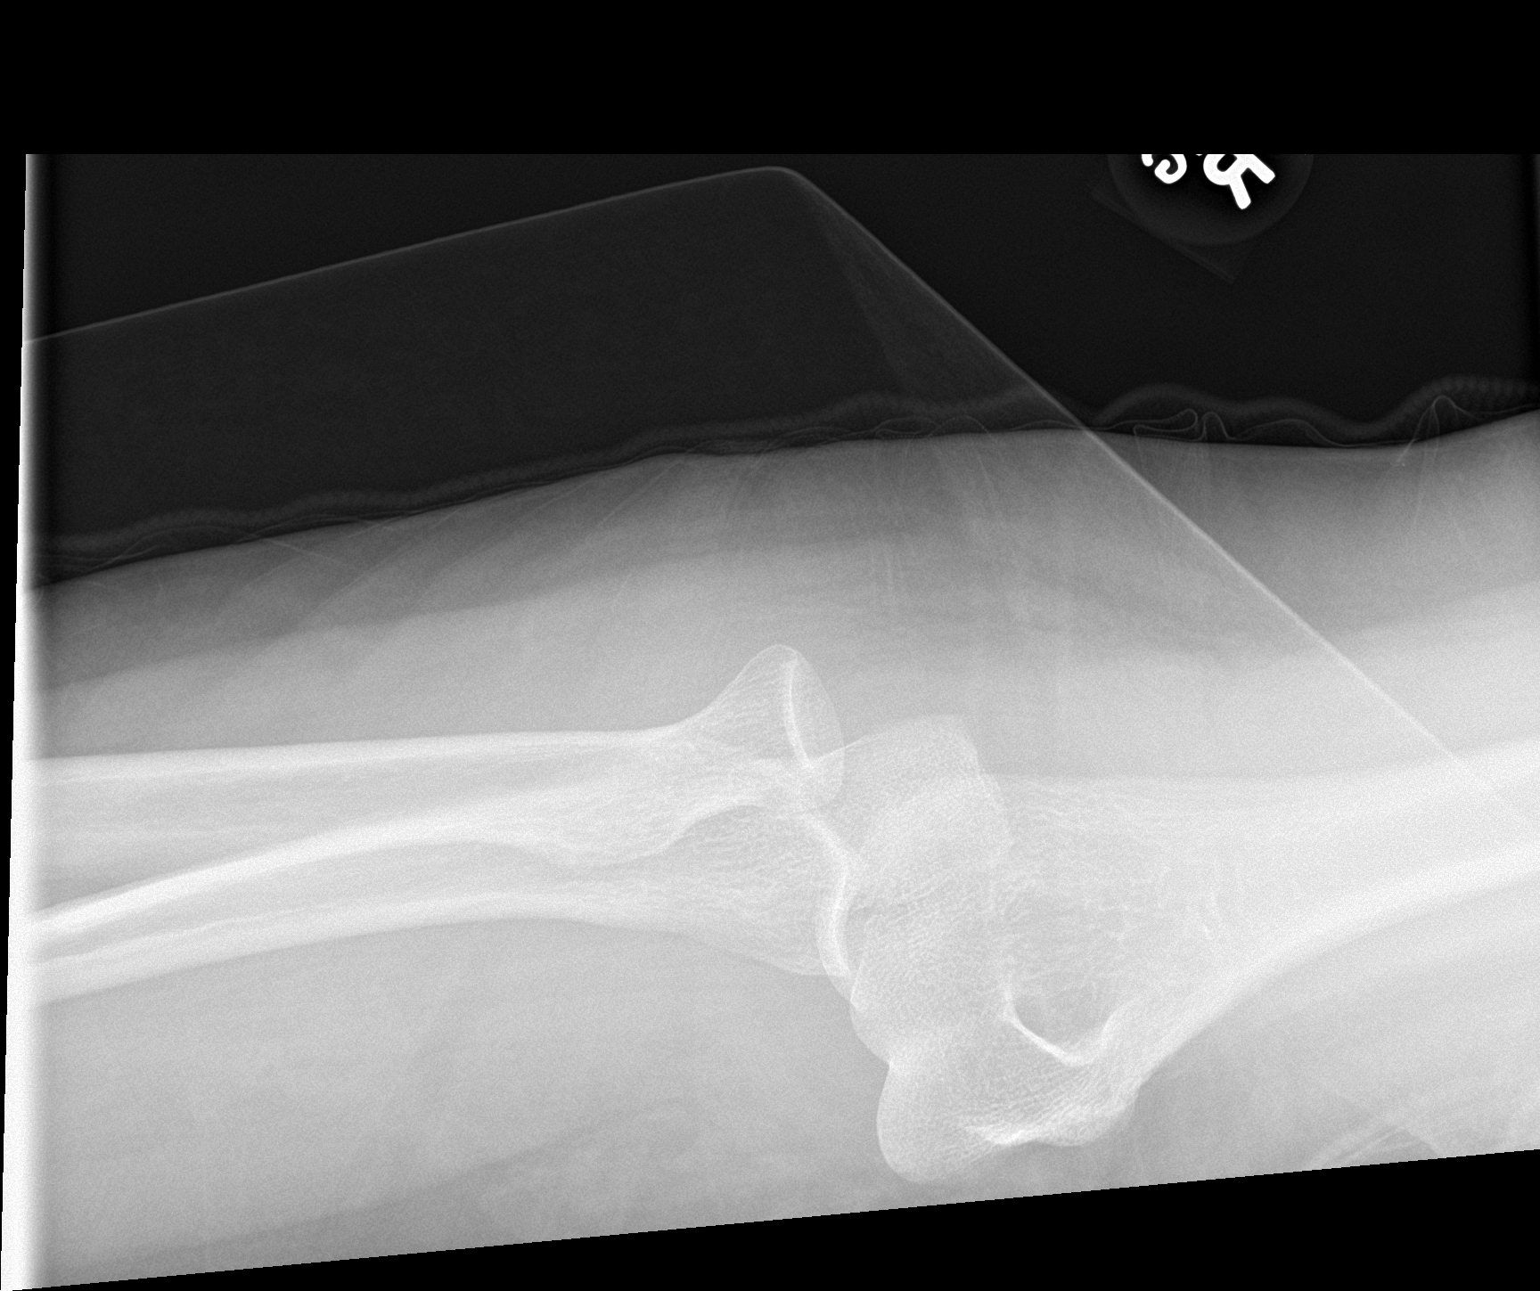

[elbow lat]
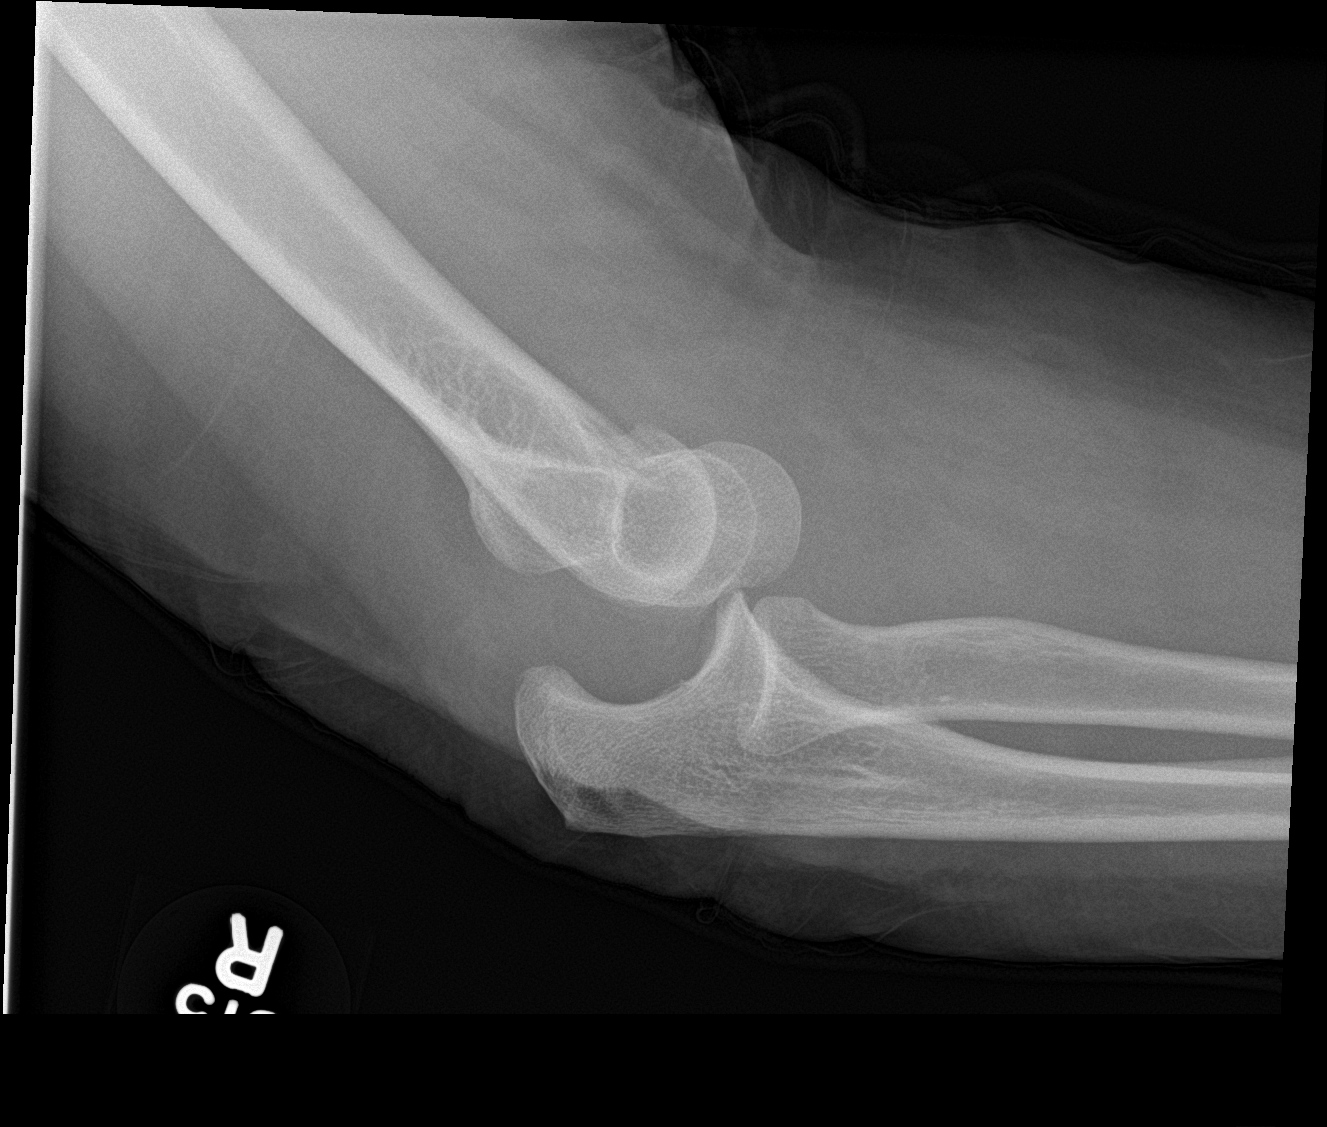

[2 of 2 positions shown; findings below may reference images not displayed]

FINDINGS: Osseous mineralization normal.

Radial and posterior RIGHT elbow dislocation.

No definite acute fracture fragments identified.

Associated soft tissue deformity.
IMPRESSION: Dorsolateral RIGHT elbow dislocation without definite fracture.

## 2016-12-12 IMAGING — CR DG ELBOW 2V*R*
1 series · 2 of 2 positions shown · non-contrast
Comparison: Earlier today

CLINICAL DATA: Postreduction right elbow.  Initial encounter.

EXAM:
RIGHT ELBOW - 2 VIEW

[Series 2: ap · 0.17mm/px · 2 of 2 slices shown]
[im 1/2]
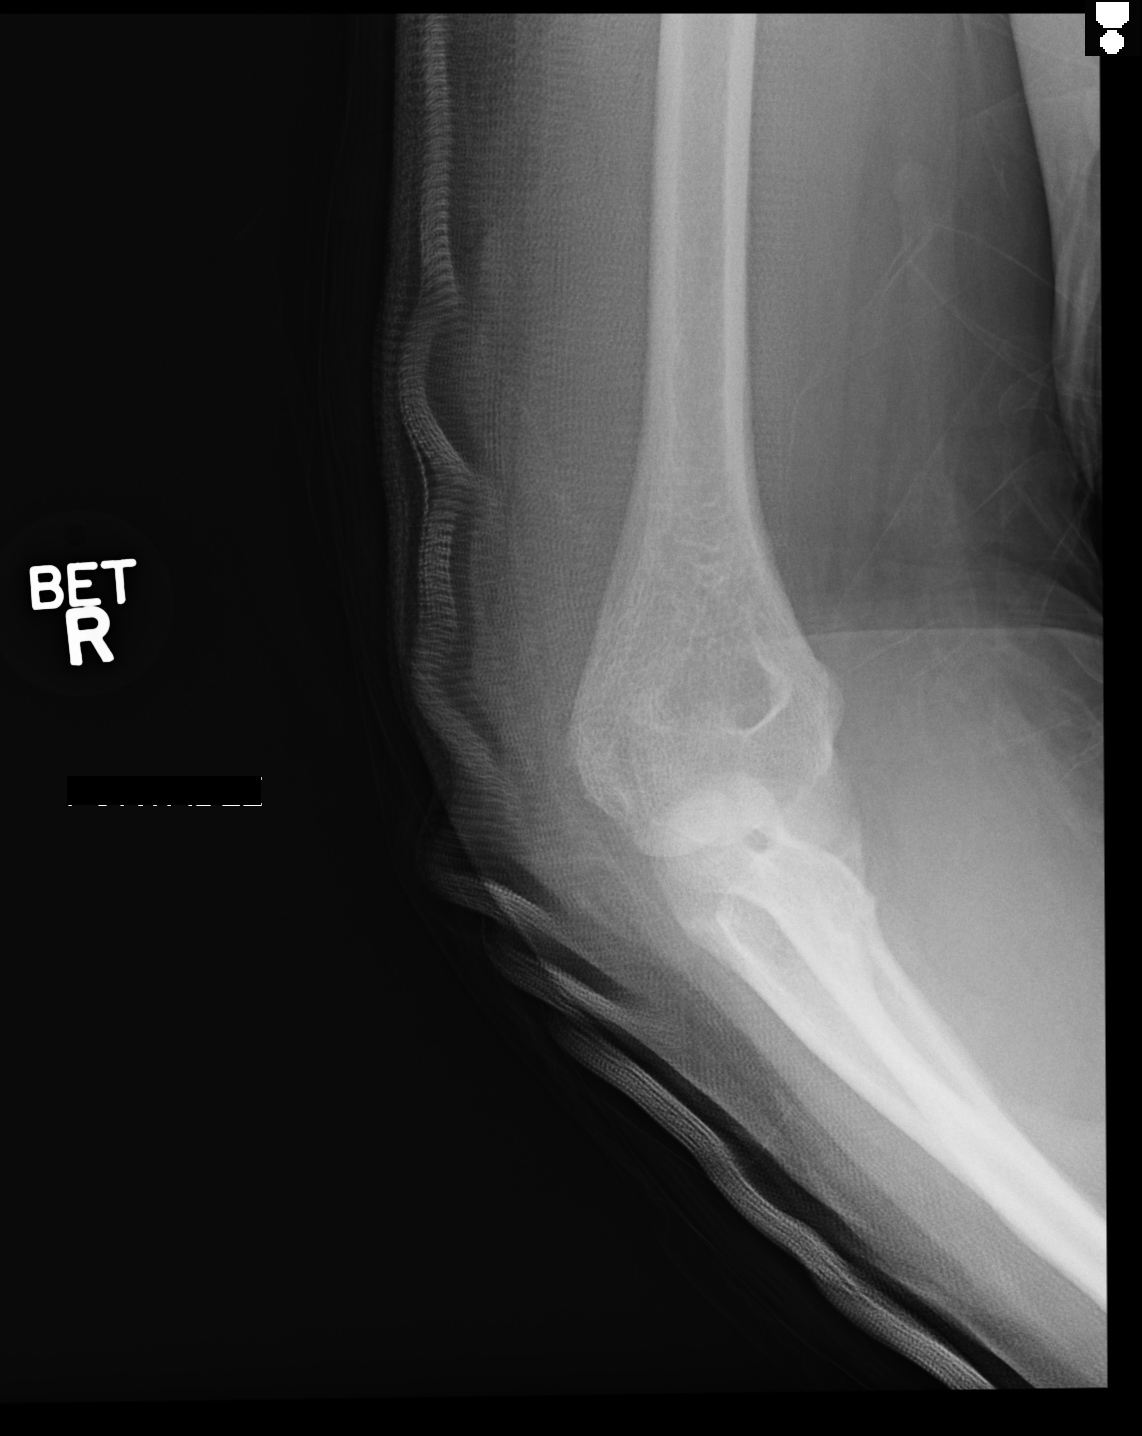
[im 2/2]
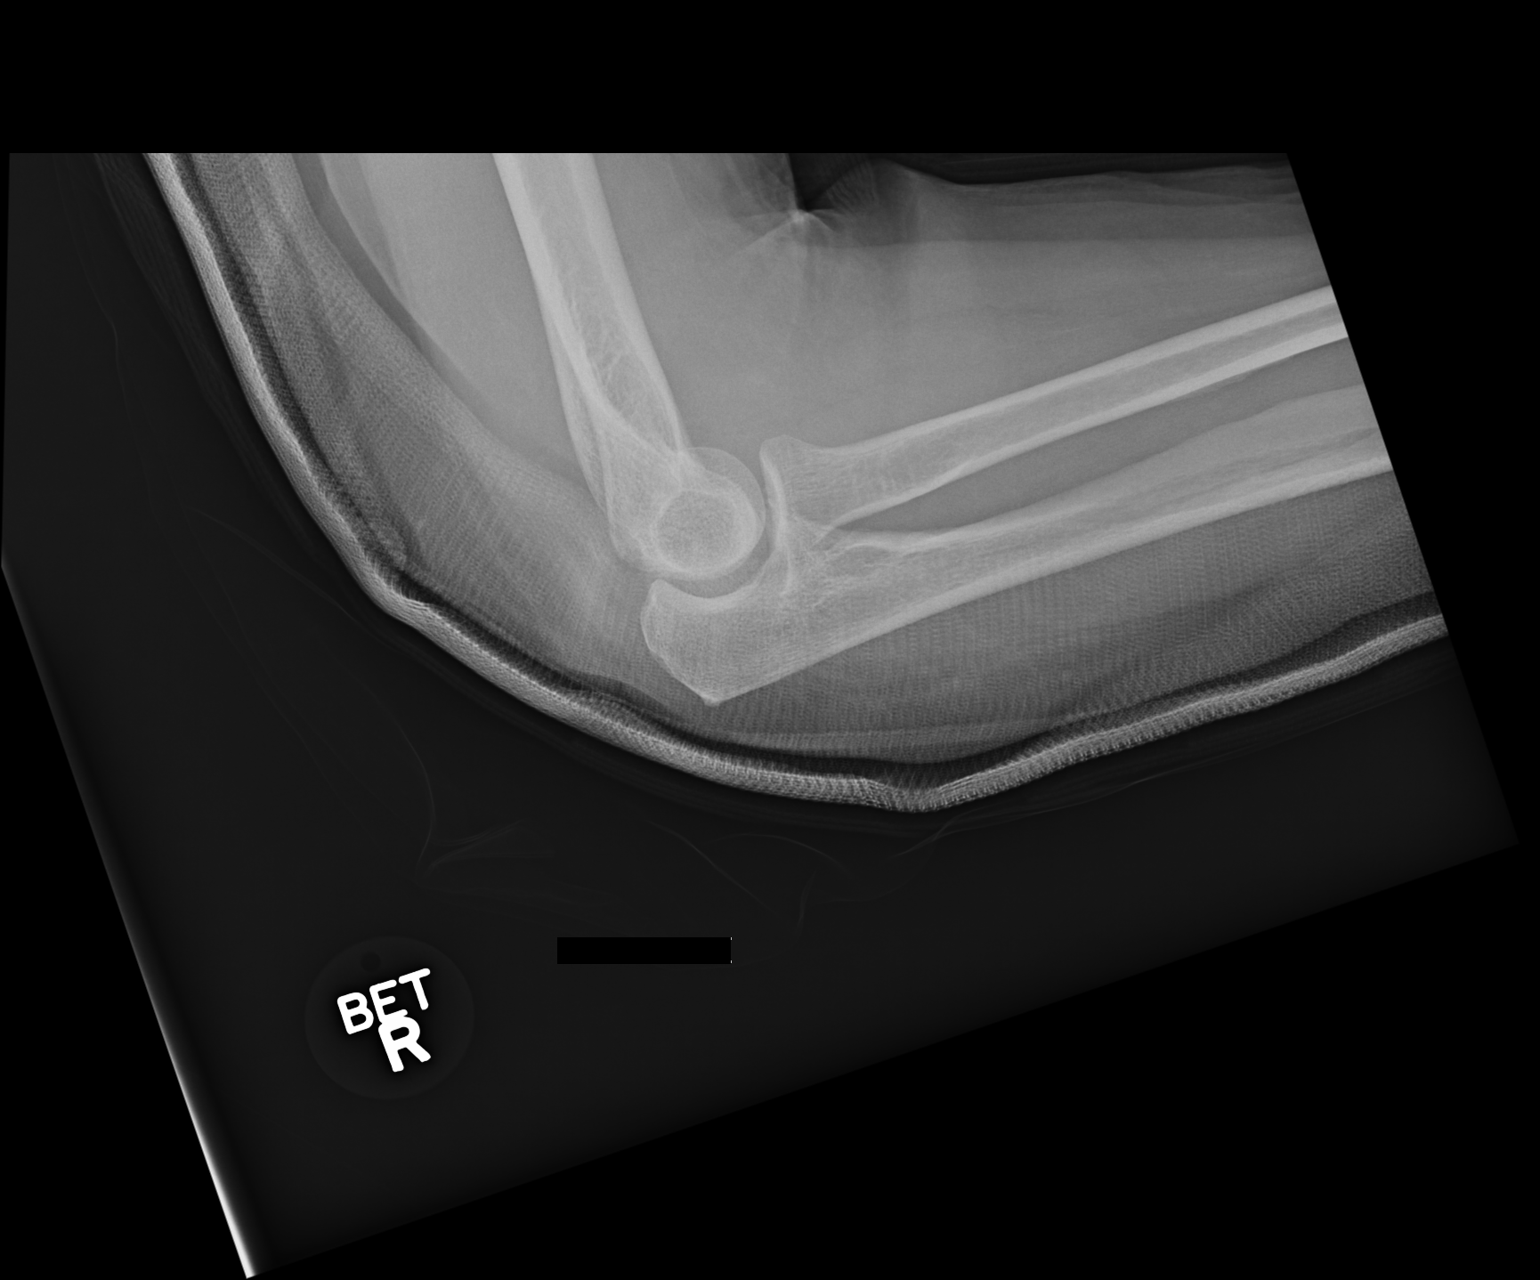

[2 of 2 positions shown; findings below may reference images not displayed]

FINDINGS: Limited AP view but relocated elbow in the lateral projection. No
appreciable fracture.
IMPRESSION: 1. Relocated elbow in the lateral projection.
2. Nondiagnostic AP view.

## 2016-12-19 DIAGNOSIS — L308 Other specified dermatitis: Secondary | ICD-10-CM | POA: Diagnosis not present

## 2017-02-06 IMAGING — US US BREAST*R* LIMITED INC AXILLA
1 series · 4 of 4 positions shown · non-contrast
Comparison: Previous exam(s).

CLINICAL DATA: Right central breast asymmetry seen on most recent
screening mammography. Patient has a history of excisional biopsy of
the right breast.

EXAM:
2D DIGITAL DIAGNOSTIC RIGHT MAMMOGRAM WITH ADJUNCT TOMO
ULTRASOUND RIGHT BREAST

[Series 1: us breast*right* limited inc axilla · 0.06mm/px · 4 of 4 slices shown]
[im 1/4]
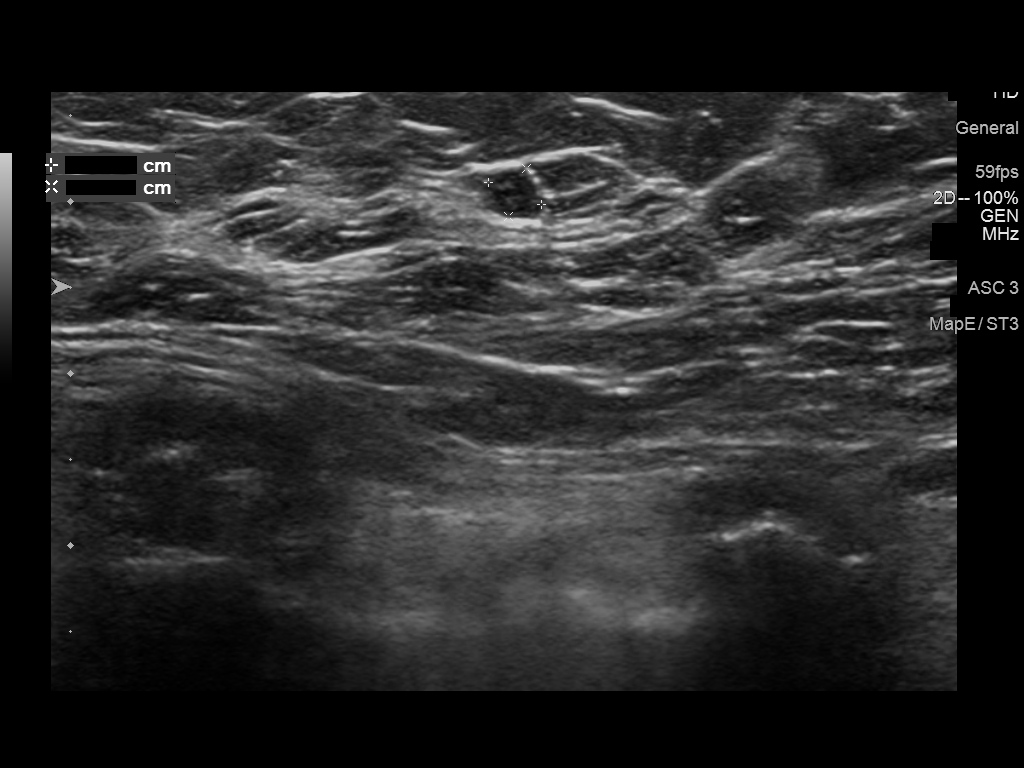
[im 2/4]
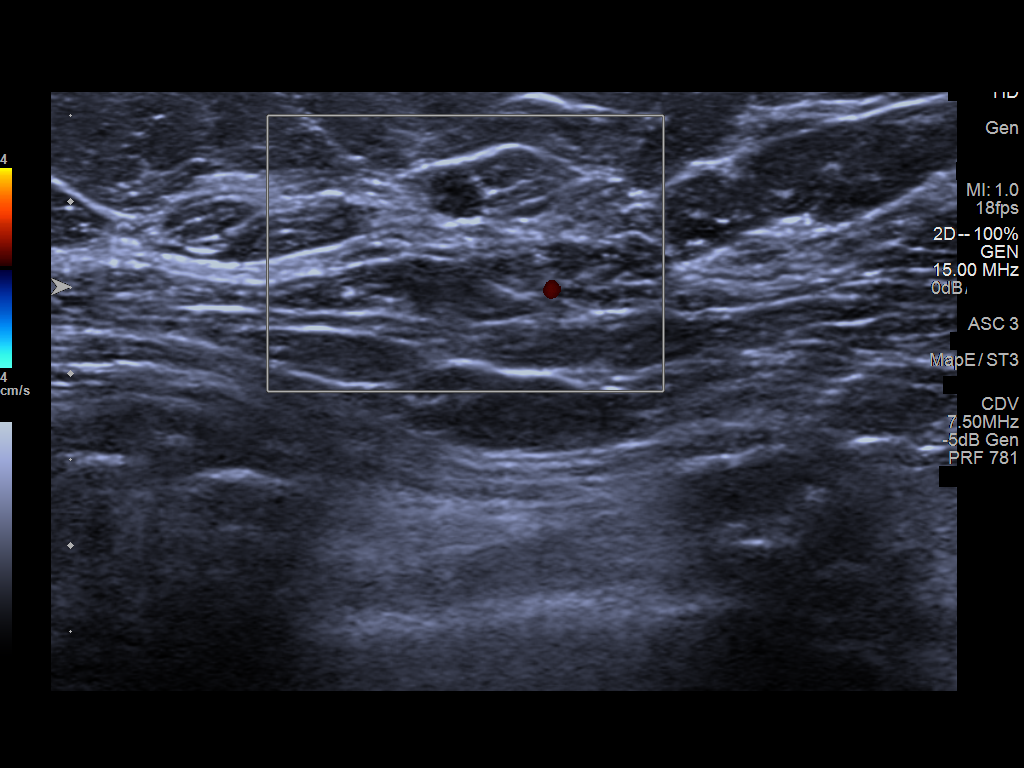
[im 3/4]
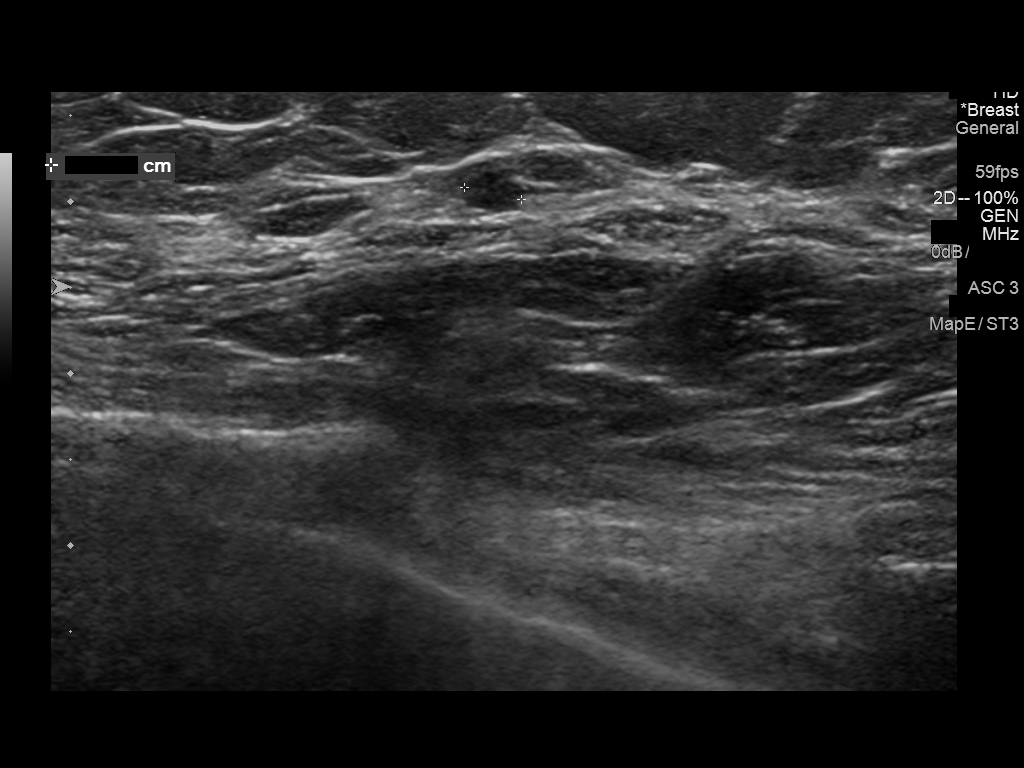
[im 4/4]
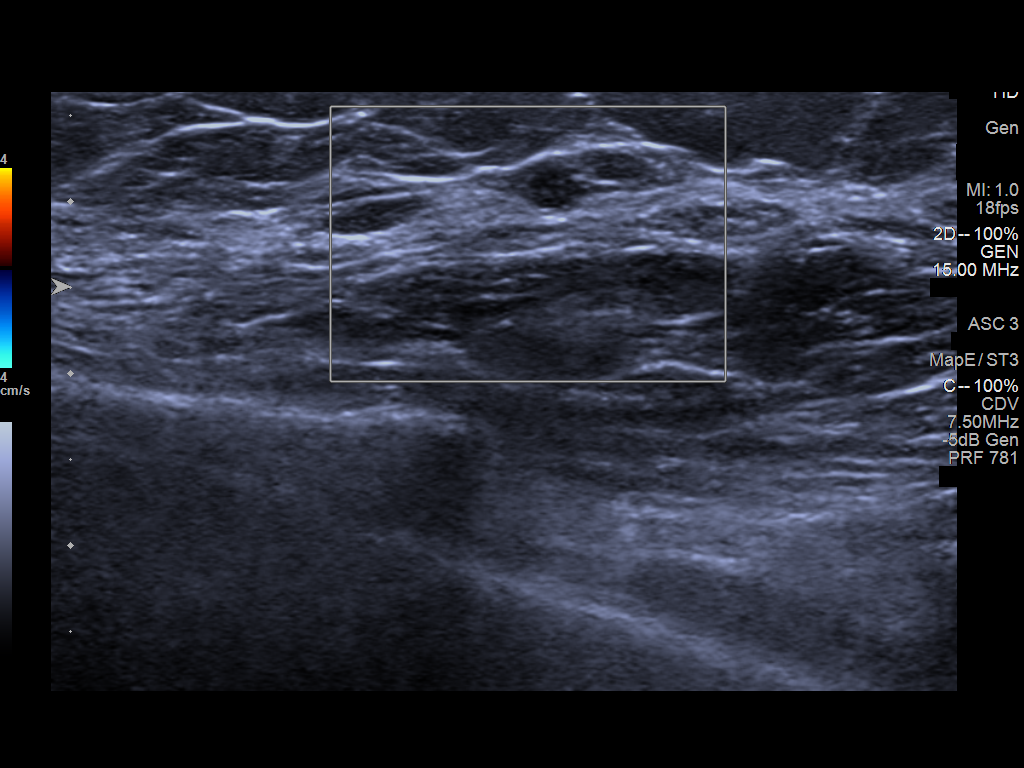

[4 of 4 positions shown; findings below may reference images not displayed]

ACR Breast Density Category c: The breast tissue is heterogeneously
dense, which may obscure small masses.
FINDINGS: Mammographically, there is a sub cm circumscribed benign appearing
nodule at the site of previously seen asymmetry in the right breast
slightly lower outer quadrant, posterior depth. Postsurgical
scarring from benign excisional biopsy is seen in the right breast
upper outer quadrant.

On physical exam, no suspicious masses are found.

Targeted ultrasound is performed, showing right breast 8:30 o'clock
5 cm from the nipple benign-appearing circumscribed hypoechoic 4 mm
nodule, which is favored to correspond to the mammographically seen
nodule.
IMPRESSION: Right breast benign-appearing nodule, for which six-month follow-up
is recommended.

RECOMMENDATION:
Diagnostic mammogram and possibly ultrasound of the right breast in
6 months. (Code:X1-V-66X)

I have discussed the findings and recommendations with the patient.
Results were also provided in writing at the conclusion of the
visit. If applicable, a reminder letter will be sent to the patient
regarding the next appointment.

BI-RADS CATEGORY  3: Probably benign.

## 2017-02-13 DIAGNOSIS — Z79899 Other long term (current) drug therapy: Secondary | ICD-10-CM | POA: Diagnosis not present

## 2017-04-12 DIAGNOSIS — E785 Hyperlipidemia, unspecified: Secondary | ICD-10-CM | POA: Diagnosis not present

## 2017-04-12 DIAGNOSIS — Z23 Encounter for immunization: Secondary | ICD-10-CM | POA: Diagnosis not present

## 2017-04-12 DIAGNOSIS — R7301 Impaired fasting glucose: Secondary | ICD-10-CM | POA: Diagnosis not present

## 2017-04-12 DIAGNOSIS — E782 Mixed hyperlipidemia: Secondary | ICD-10-CM | POA: Diagnosis not present

## 2017-04-16 ENCOUNTER — Other Ambulatory Visit (HOSPITAL_COMMUNITY): Payer: Self-pay | Admitting: Internal Medicine

## 2017-04-16 DIAGNOSIS — Z0000 Encounter for general adult medical examination without abnormal findings: Secondary | ICD-10-CM | POA: Diagnosis not present

## 2017-04-16 DIAGNOSIS — Z780 Asymptomatic menopausal state: Secondary | ICD-10-CM

## 2017-04-16 DIAGNOSIS — N182 Chronic kidney disease, stage 2 (mild): Secondary | ICD-10-CM | POA: Diagnosis not present

## 2017-04-16 DIAGNOSIS — R251 Tremor, unspecified: Secondary | ICD-10-CM | POA: Diagnosis not present

## 2017-04-16 DIAGNOSIS — E782 Mixed hyperlipidemia: Secondary | ICD-10-CM | POA: Diagnosis not present

## 2017-04-16 DIAGNOSIS — R7301 Impaired fasting glucose: Secondary | ICD-10-CM | POA: Diagnosis not present

## 2017-04-30 ENCOUNTER — Encounter (HOSPITAL_COMMUNITY): Payer: Self-pay

## 2017-04-30 ENCOUNTER — Other Ambulatory Visit (HOSPITAL_COMMUNITY): Payer: Self-pay

## 2017-07-09 DIAGNOSIS — H6502 Acute serous otitis media, left ear: Secondary | ICD-10-CM | POA: Diagnosis not present

## 2017-07-16 DIAGNOSIS — H6692 Otitis media, unspecified, left ear: Secondary | ICD-10-CM | POA: Diagnosis not present

## 2017-08-09 ENCOUNTER — Ambulatory Visit: Payer: Medicare Other | Admitting: Neurology

## 2017-08-09 ENCOUNTER — Encounter: Payer: Self-pay | Admitting: Neurology

## 2017-08-09 VITALS — BP 145/80 | HR 77 | Ht 68.0 in | Wt 205.0 lb

## 2017-08-09 DIAGNOSIS — G454 Transient global amnesia: Secondary | ICD-10-CM

## 2017-08-09 DIAGNOSIS — R251 Tremor, unspecified: Secondary | ICD-10-CM

## 2017-08-09 DIAGNOSIS — R413 Other amnesia: Secondary | ICD-10-CM | POA: Diagnosis not present

## 2017-08-09 HISTORY — DX: Other amnesia: R41.3

## 2017-08-09 HISTORY — DX: Tremor, unspecified: R25.1

## 2017-08-09 HISTORY — DX: Transient global amnesia: G45.4

## 2017-08-09 NOTE — Progress Notes (Signed)
PATIENT: [name] Brown DOB: October 05, 1940  Chief Complaint  Patient presents with  . Episode of loss of time    She had an isolated event of a "loss of time" that occurred in December 2018.  She was going out to eat dinner then to a concert at [facility] with family.  Says apparently she drove to the restaurant, ate dinner, went to the concert and then home.  She does not remember the evening.  She paid her bill for her meal and has pictures on her phone but does not remember any of it.  Says she returned to her normal baseline within about 3 hours.  No further episdoes.   Marland Kitchen PCP    Celene Squibb, MD     HISTORICAL  SHEYLIN Brown is a 77 year old female, seen in refer by his primary care physician Dr. Nevada Crane, Edwinna Areola for episodes of loss of time, initial evaluation was on August 09, 2017.  Summarized the referring note, she has a long history of bilateral hands tremor, head titubation, since 77 years old, taking Inderal, and Xanax for her tremor.  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  She drove her grandchildren to consult in December 2018, had dinner outside, but she has no recollection of the detail, could not remember she has driven, could not remember people sitting around during the concert, over the performance.  The memory loss last about 3-4 hours, was noted by her grandchildren repeat multiple questions during those hours.  She denies a history of seizure, stroke, she started taking aspirin 81 mg since that event,  Laboratory evaluation seen November 2018, hematocrit 14.3, creatinine 0.77, LDL was mildly elevated 132, cholesterol was 206, A1c was 5.7  REVIEW OF SYSTEMS: Full 14 system review of systems performed and notable only for as above  ALLERGIES: Allergies  Allergen Reactions  . Penicillins Anaphylaxis    Has patient had a PCN reaction causing immediate rash,  facial/tongue/throat swelling, SOB or lightheadedness with hypotension: Yes Has patient had a PCN reaction causing severe rash involving mucus membranes or skin necrosis: No Has patient had a PCN reaction that required hospitalization Yes Has patient had a PCN reaction occurring within the last 10 years: Yes If all of the above answers are "NO", then may proceed with Cephalosporin use.   . Cephalosporins Other (See Comments)    Due to allergic reaction to penicillin  . Prochlorperazine Edisylate     REACTION: draws mouth to side    HOME MEDICATIONS: Current Outpatient Medications  Medication Sig Dispense Refill  . ALPRAZolam (XANAX) 0.5 MG tablet TAKE ONE TABLET BY MOUTH TWICE DAILY AS NEEDED FOR ANXIETY (Patient taking differently: TAKE ONE TABLET BY MOUTH TWICE DAILY) 180 tablet 0  . HYDROcodone-acetaminophen (NORCO) 5-325 MG tablet Take 1 tablet by mouth every 6 (six) hours as needed for moderate pain. 30 tablet 0  . ibuprofen (ADVIL,MOTRIN) 200 MG tablet Take 800 mg by mouth at bedtime as needed for mild pain or moderate pain.    . mirtazapine (REMERON) 30 MG tablet Take 1 tablet (30 mg total) by mouth at bedtime. 90 tablet 0  . propranolol (INDERAL) 40 MG tablet Take 40 mg by mouth 2 (two) times daily.     No current facility-administered medications for this visit.     PAST MEDICAL HISTORY: Past Medical History:  Diagnosis Date  . Anxiety   .  Depression   . Essential tremor     PAST SURGICAL HISTORY: Past Surgical History:  Procedure Laterality Date  . ABDOMINAL HYSTERECTOMY    . APPENDECTOMY    . breast biopsies Bilateral   . CYST EXCISION N/A 07/10/2016   Procedure: 3CM CYST EXCISION TRUNK;  Surgeon: Aviva Signs, MD;  Location: [facility];  Service: General;  Laterality: N/A;  . FL INJ LEFT KNEE CT ARTHROGRAM ([place] HX)    . TONSILLECTOMY      FAMILY HISTORY: Family History  Problem Relation Age of Onset  . Atrial fibrillation Mother   . COPD Mother   . Atrial  fibrillation Father   . Lung cancer Father   . Atrial fibrillation Sister     SOCIAL HISTORY:  Social History   Socioeconomic History  . Marital status: Widowed    Spouse name: Not on file  . Number of children: 2  . Years of education: college  . Highest education level: Not on file  Social Needs  . Financial resource strain: Not on file  . Food insecurity - worry: Not on file  . Food insecurity - inability: Not on file  . Transportation needs - medical: Not on file  . Transportation needs - non-medical: Not on file  Occupational History  . Occupation: Retired Therapist, sports  Tobacco Use  . Smoking status: Never Smoker  . Smokeless tobacco: Never Used  Substance and Sexual Activity  . Alcohol use: Yes    Comment: occasional glass of wine  . Drug use: No  . Sexual activity: No  Other Topics Concern  . Not on file  Social History Narrative   Lives alone.   Right-handed.   Two children - 1 biological, 1 adopted.   Occasional use of caffeine.     PHYSICAL EXAM   Vitals:   08/09/17 0927  BP: (!) 145/80  Pulse: 77  Weight: 205 lb (93 kg)  Height: 5\' 8"  (1.727 m)    Not recorded      Body mass index is 31.17 kg/m.  PHYSICAL EXAMNIATION:  Gen: NAD, conversant, well nourised, obese, well groomed                     Cardiovascular: Regular rate rhythm, no peripheral edema, warm, nontender. Eyes: Conjunctivae clear without exudates or hemorrhage Neck: Supple, no carotid bruits. Pulmonary: Clear to auscultation bilaterally   NEUROLOGICAL EXAM:  MENTAL STATUS: Speech:    Speech is normal; fluent and spontaneous with normal comprehension.  Cognition:     Orientation to time, place and person     Normal recent and remote memory     Normal Attention span and concentration     Normal Language, naming, repeating,spontaneous speech     Fund of knowledge   CRANIAL NERVES: CN II: Visual fields are full to confrontation. Fundoscopic exam is normal with sharp discs and no  vascular changes. Pupils are round equal and briskly reactive to light. CN III, IV, VI: extraocular movement are normal. No ptosis. CN V: Facial sensation is intact to pinprick in all 3 divisions bilaterally. Corneal responses are intact.  CN VII: Face is symmetric with normal eye closure and smile. CN VIII: Hearing is normal to rubbing fingers CN IX, X: Palate elevates symmetrically. Phonation is normal. CN XI: Head turning and shoulder shrug are intact CN XII: Tongue is midline with normal movements and no atrophy.  MOTOR: Bilateral hand posturing tremor, normal muscle tone and strength.  REFLEXES: Reflexes are 2+  and symmetric at the biceps, triceps, knees, and ankles. Plantar responses are flexor.  SENSORY: Intact to light touch, pinprick, positional sensation and vibratory sensation are intact in fingers and toes.  COORDINATION: Rapid alternating movements and fine finger movements are intact. There is no dysmetria on finger-to-nose and heel-knee-shin.    GAIT/STANCE: Posture is normal. Gait is steady with normal steps, base, arm swing, and turning. Heel and toe walking are normal. Tandem gait is normal.  Romberg is absent.   DIAGNOSTIC DATA (LABS, IMAGING, TESTING) - I reviewed patient records, labs, notes, testing and imaging myself where available.   ASSESSMENT AND PLAN  JANELLE SPELLMAN is a 77 y.o. female   Sudden onset memory loss in December 2018  Possible transient global amnesia, differentiation diagnoses also include stroke, seizure  Proceed with MRI of the brain without contrast  EEG  Echocardiogram  Ultrasound of carotid artery  Aspirin daily, increase water intake  Marcial Pacas, M.D. Ph.D.  [facility] [address], [place], [place] [address] Ph: [phone] Fax: [phone]  CC: Celene Squibb, MD

## 2017-08-17 ENCOUNTER — Ambulatory Visit: Payer: Medicare Other

## 2017-08-17 DIAGNOSIS — G454 Transient global amnesia: Secondary | ICD-10-CM

## 2017-08-17 DIAGNOSIS — R251 Tremor, unspecified: Secondary | ICD-10-CM

## 2017-08-17 DIAGNOSIS — R413 Other amnesia: Secondary | ICD-10-CM

## 2017-08-18 ENCOUNTER — Ambulatory Visit
Admission: RE | Admit: 2017-08-18 | Discharge: 2017-08-18 | Disposition: A | Discharge status: Home / Self Care | Payer: Medicare Other | Source: Ambulatory Visit | Attending: Neurology | Admitting: Neurology

## 2017-08-18 DIAGNOSIS — R413 Other amnesia: Secondary | ICD-10-CM

## 2017-08-22 ENCOUNTER — Ambulatory Visit (HOSPITAL_COMMUNITY)
Admission: RE | Admit: 2017-08-22 | Discharge: 2017-08-22 | Disposition: A | Discharge status: Home / Self Care | Payer: Medicare Other | Source: Ambulatory Visit | Attending: Neurology | Admitting: Neurology

## 2017-08-22 ENCOUNTER — Ambulatory Visit (HOSPITAL_BASED_OUTPATIENT_CLINIC_OR_DEPARTMENT_OTHER)
Admission: RE | Admit: 2017-08-22 | Discharge: 2017-08-22 | Disposition: A | Discharge status: Home / Self Care | Payer: Medicare Other | Source: Ambulatory Visit | Attending: Neurology | Admitting: Neurology

## 2017-08-22 DIAGNOSIS — I5030 Unspecified diastolic (congestive) heart failure: Secondary | ICD-10-CM | POA: Insufficient documentation

## 2017-08-22 DIAGNOSIS — I6522 Occlusion and stenosis of left carotid artery: Secondary | ICD-10-CM | POA: Insufficient documentation

## 2017-08-22 DIAGNOSIS — R413 Other amnesia: Secondary | ICD-10-CM

## 2017-08-22 DIAGNOSIS — G454 Transient global amnesia: Secondary | ICD-10-CM | POA: Diagnosis not present

## 2017-08-22 DIAGNOSIS — R251 Tremor, unspecified: Secondary | ICD-10-CM

## 2017-08-22 NOTE — Progress Notes (Signed)
  Echocardiogram 2D Echocardiogram has been performed.  Darlina Sicilian M 08/22/2017, 11:22 AM

## 2017-08-22 NOTE — Progress Notes (Signed)
Bilateral carotid duplex completed. Right - No evidence of significant ICA stenosis. Left - 1% to 39% ICA stenosis lower end of range. Bilateral vertebral artery flow is antegrade. [facility], [place]  08/22/2017, 11:18 AM

## 2017-08-23 ENCOUNTER — Telehealth: Payer: Self-pay | Admitting: *Deleted

## 2017-08-23 ENCOUNTER — Encounter: Payer: Self-pay | Admitting: *Deleted

## 2017-08-23 NOTE — Telephone Encounter (Signed)
Duplicate note

## 2017-08-23 NOTE — Telephone Encounter (Signed)
Final Interpretation: Right Carotid: There is no evidence of stenosis in the right ICA.  Left Carotid: Plaque in the left ICA is consistent with a 1-39% stenosis but       velocities are within normal limits.Mild acoustic shadowing may       obscure higher velocities  Vertebrals: Bilateral vertebral arteries demonstrate antegrade flow. Subclavians: Normal flow hemodynamics were seen in bilateral subclavian       arteries.

## 2017-08-23 NOTE — Telephone Encounter (Signed)
Left patient a detailed message, with results, on voicemail (ok per DPR).  Provided our number to call back with any questions.  

## 2017-08-31 NOTE — Procedures (Signed)
   HISTORY: 77 year old female, with episode of confusion  TECHNIQUE:  16 channel EEG was performed based on standard 10-16 international system. One channel was dedicated to EKG, which has demonstrates normal sinus rhythm.  Upon awakening, the posterior background activity was well-developed, in alpha range, with mixed small amplitude beta range activity, reactive to eye opening and closure.  There was no evidence of epileptiform discharge.  Photic stimulation was performed, which induced a symmetric photic driving.  Hyperventilation was performed, there was no abnormality elicit.  No sleep was achieved.  CONCLUSION: This is a  normal awake  EEG.  There is no electrodiagnostic evidence of epileptiform discharge.  The increased the beta range activity can be related to benzodiazepine use.  Marcial Pacas, M.D. Ph.D.  [facility] [place], [place] [address] Phone: [phone] Fax:      [phone]

## 2017-10-10 DIAGNOSIS — H6692 Otitis media, unspecified, left ear: Secondary | ICD-10-CM | POA: Diagnosis not present

## 2017-10-10 DIAGNOSIS — R251 Tremor, unspecified: Secondary | ICD-10-CM | POA: Diagnosis not present

## 2017-10-10 DIAGNOSIS — Z23 Encounter for immunization: Secondary | ICD-10-CM | POA: Diagnosis not present

## 2017-10-10 DIAGNOSIS — R7301 Impaired fasting glucose: Secondary | ICD-10-CM | POA: Diagnosis not present

## 2017-10-10 DIAGNOSIS — E782 Mixed hyperlipidemia: Secondary | ICD-10-CM | POA: Diagnosis not present

## 2017-10-17 DIAGNOSIS — R7301 Impaired fasting glucose: Secondary | ICD-10-CM | POA: Diagnosis not present

## 2017-10-17 DIAGNOSIS — G454 Transient global amnesia: Secondary | ICD-10-CM | POA: Diagnosis not present

## 2017-10-17 DIAGNOSIS — R251 Tremor, unspecified: Secondary | ICD-10-CM | POA: Diagnosis not present

## 2017-10-17 DIAGNOSIS — N182 Chronic kidney disease, stage 2 (mild): Secondary | ICD-10-CM | POA: Diagnosis not present

## 2017-10-17 DIAGNOSIS — E782 Mixed hyperlipidemia: Secondary | ICD-10-CM | POA: Diagnosis not present

## 2017-10-18 ENCOUNTER — Other Ambulatory Visit (HOSPITAL_COMMUNITY): Payer: Self-pay | Admitting: Internal Medicine

## 2017-10-18 DIAGNOSIS — Z1231 Encounter for screening mammogram for malignant neoplasm of breast: Secondary | ICD-10-CM

## 2017-10-25 ENCOUNTER — Other Ambulatory Visit (HOSPITAL_COMMUNITY): Payer: Self-pay | Admitting: Internal Medicine

## 2017-10-25 DIAGNOSIS — N6310 Unspecified lump in the right breast, unspecified quadrant: Secondary | ICD-10-CM

## 2017-11-01 ENCOUNTER — Other Ambulatory Visit (HOSPITAL_COMMUNITY): Payer: Self-pay | Admitting: Internal Medicine

## 2017-11-01 DIAGNOSIS — N6310 Unspecified lump in the right breast, unspecified quadrant: Secondary | ICD-10-CM

## 2017-11-06 ENCOUNTER — Ambulatory Visit (HOSPITAL_COMMUNITY)
Admission: RE | Admit: 2017-11-06 | Discharge: 2017-11-06 | Disposition: A | Discharge status: Home / Self Care | Payer: Medicare Other | Source: Ambulatory Visit | Attending: Internal Medicine | Admitting: Internal Medicine

## 2017-11-06 DIAGNOSIS — N6310 Unspecified lump in the right breast, unspecified quadrant: Secondary | ICD-10-CM

## 2017-11-06 DIAGNOSIS — R922 Inconclusive mammogram: Secondary | ICD-10-CM | POA: Diagnosis not present

## 2017-11-13 IMAGING — US ULTRASOUND RIGHT BREAST LIMITED
1 series · 6 of 6 positions shown · non-contrast
Comparison: 11/09/2015, 10/25/2015, 08/15/2010

CLINICAL DATA: Delayed six-month follow-up of a probably benign
nodule in the [DATE] position of the right breast. Patient has a
history of benign excisional biopsy of the right breast in 9666.

EXAM:
2D DIGITAL DIAGNOSTIC RIGHT MAMMOGRAM WITH CAD AND ADJUNCT TOMO
ULTRASOUND RIGHT BREAST

[Series 1: ultrasound right breast limited · 0.06mm/px · 6 of 6 slices shown]
[im 1/6]
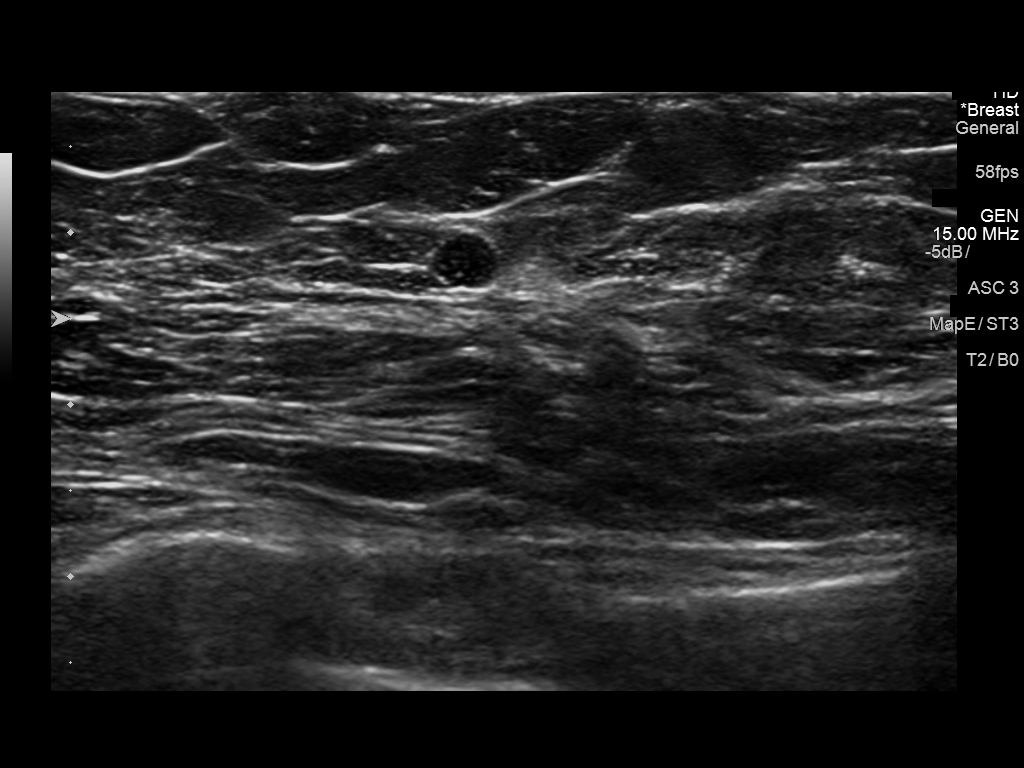
[im 2/6]
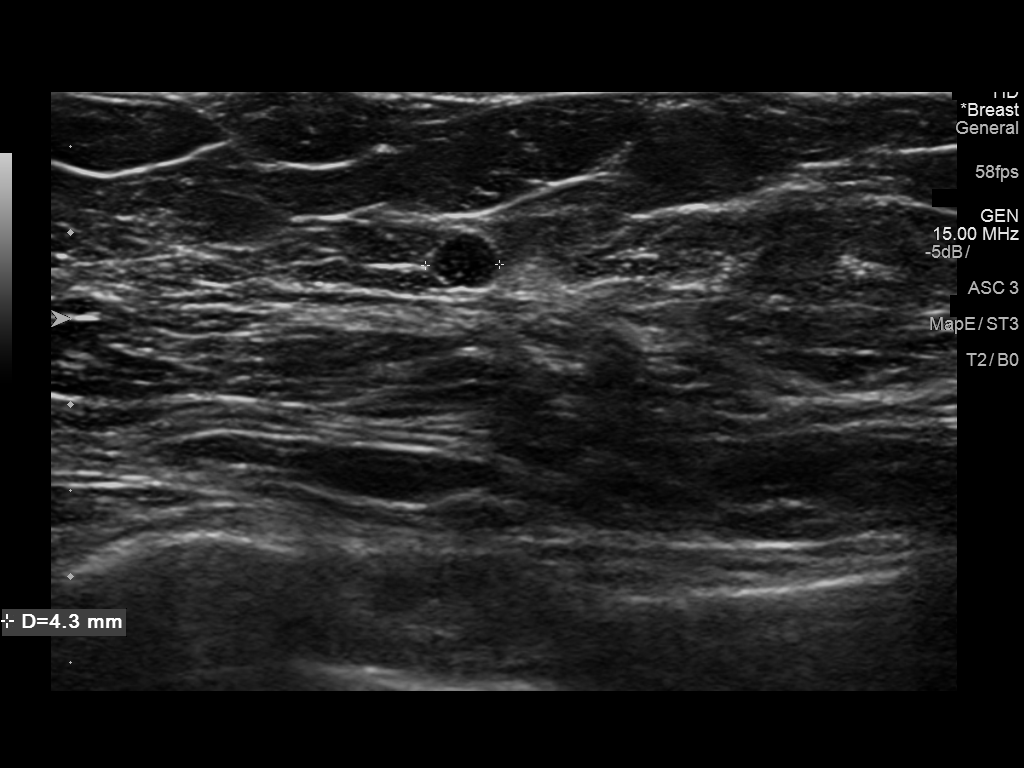
[im 3/6]
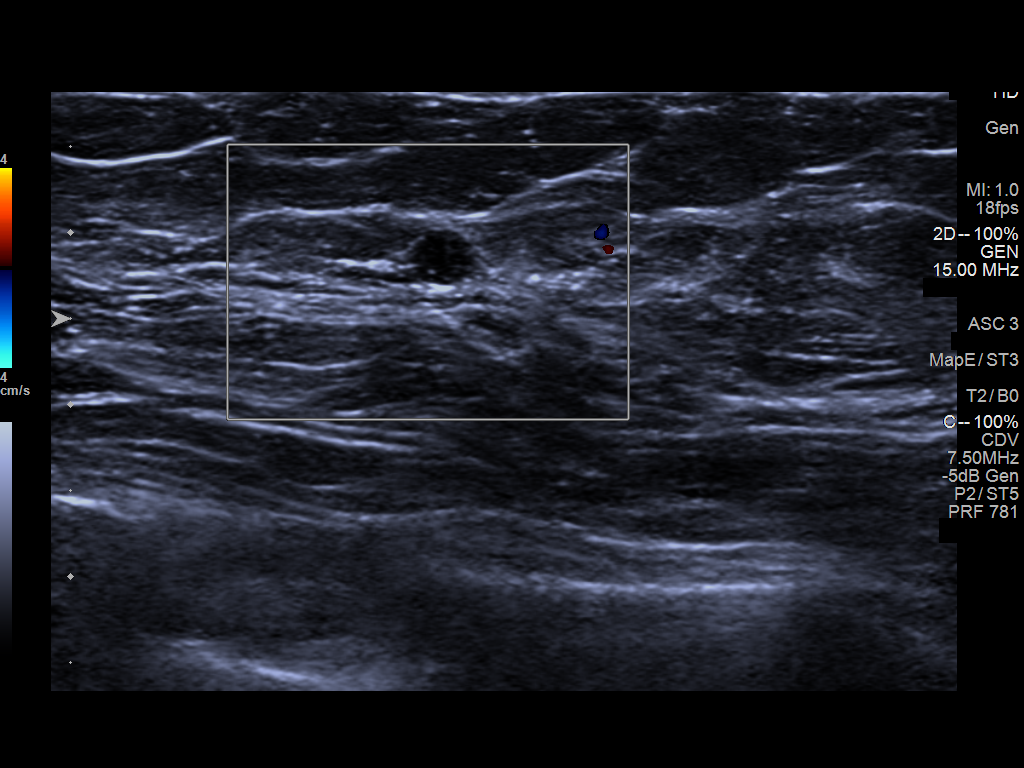
[im 4/6]
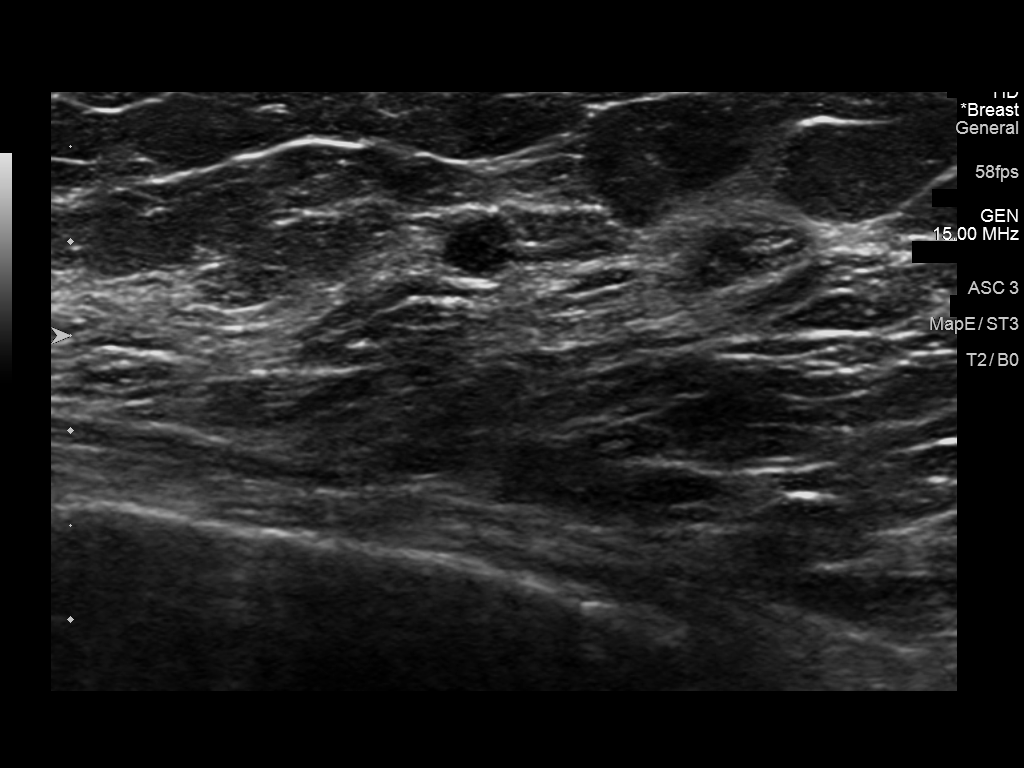
[im 5/6]
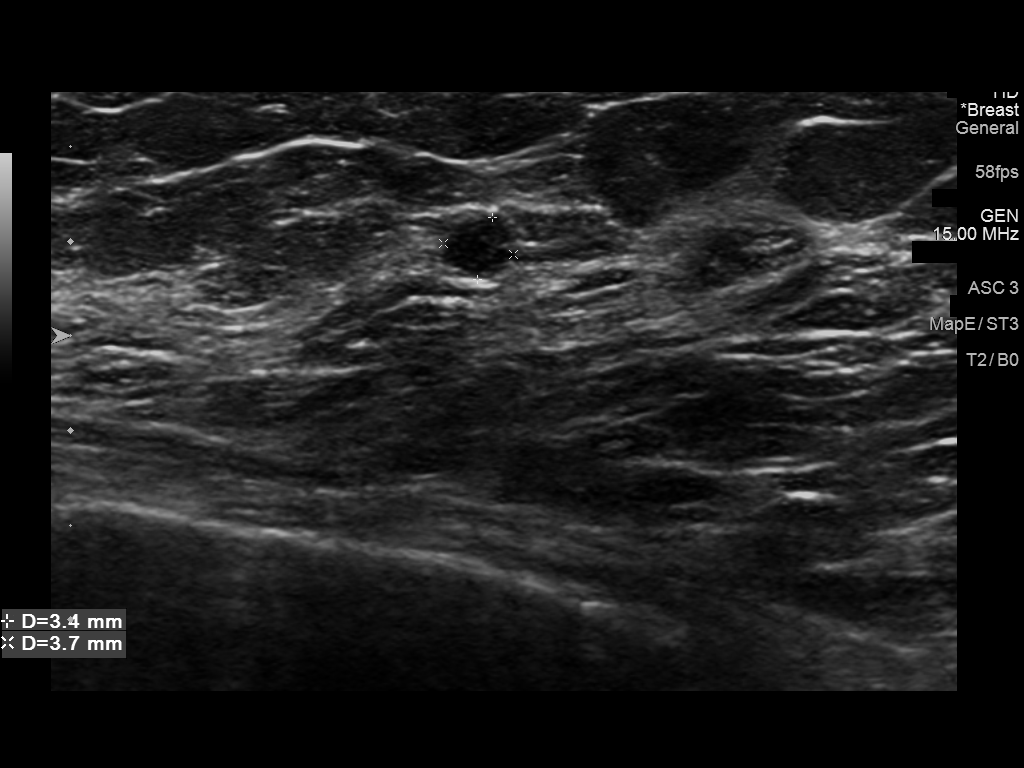
[im 6/6]
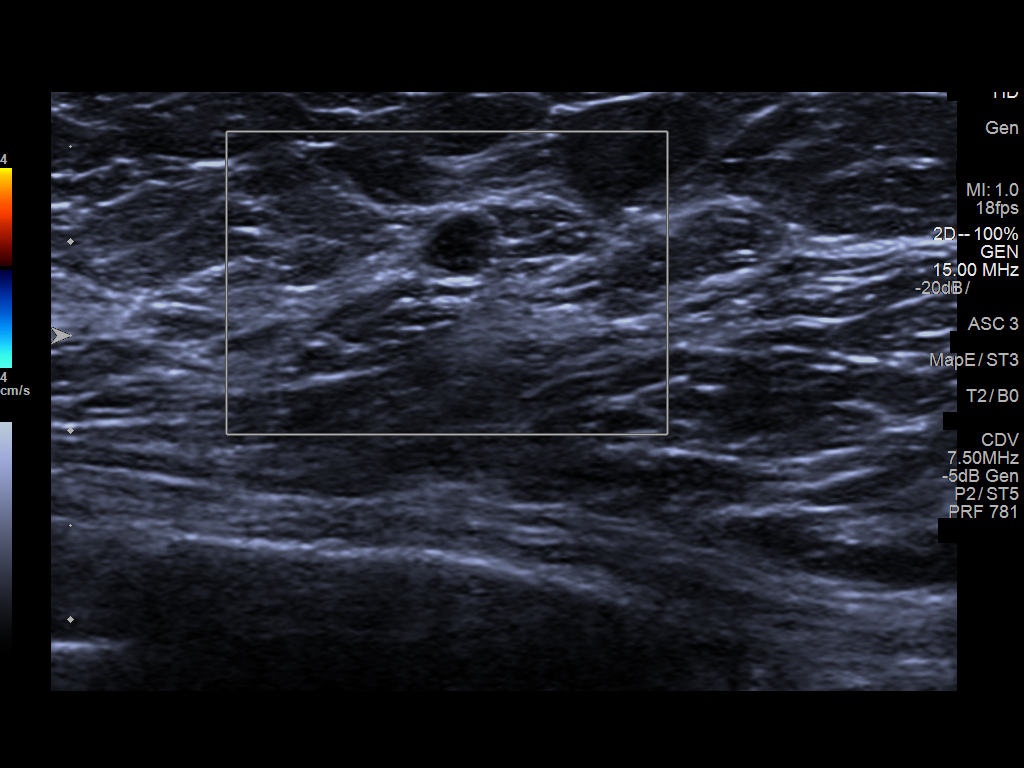

[6 of 6 positions shown; findings below may reference images not displayed]

ACR Breast Density Category c: The breast tissue is heterogeneously
dense, which may obscure small masses.
FINDINGS: At approximately 4 mm circumscribed oval nodule in the posterior
third of the lower outer quadrant of the right breast is
mammographically stable. There are post-surgical excisional biopsy
changes in the right breast, stable. No new mass, nonsurgical
distortion, or suspicious microcalcification is identified.

Mammographic images were processed with CAD.

Targeted ultrasound is performed, showing a circumscribed 3 x 4 x 4
mm mass [DATE] position 5 cm from the nipple. This has ultrasound
characteristics suggestive of a small cyst with some internal
debris. There is no associated vascular flow. On the prior
ultrasound October 2015, this nodule was measured to be 3 x 3 x 3 mm.
No significant interval change.
IMPRESSION: Probably benign 4 mm complicated cyst in the right breast.

RECOMMENDATION:
Bilateral diagnostic mammogram and possible right breast ultrasound
is recommended in October 2016 to complete a 1 year follow-up.

I have discussed the findings and recommendations with the patient.
Results were also provided in writing at the conclusion of the
visit. If applicable, a reminder letter will be sent to the patient
regarding the next appointment.

BI-RADS CATEGORY  3: Probably benign.

## 2017-11-19 ENCOUNTER — Encounter: Payer: Self-pay | Admitting: Neurology

## 2017-11-19 ENCOUNTER — Ambulatory Visit: Payer: Medicare Other | Admitting: Neurology

## 2017-11-19 VITALS — BP 132/80 | HR 76 | Ht 68.0 in | Wt 224.2 lb

## 2017-11-19 DIAGNOSIS — G454 Transient global amnesia: Secondary | ICD-10-CM | POA: Diagnosis not present

## 2017-11-19 NOTE — Progress Notes (Signed)
PATIENT: PAMMY VESEY DOB: 1941-04-21  Chief Complaint  Patient presents with  . Episode of loss time    She is taking aspirin 81mg  daily.  She would like to review her test results (MRI, US Carotid, EEG, ECHO).  Denies any further episodes.     HISTORICAL  JORY WELKE is a 77 year old female, seen in refer by his primary care physician Dr. Nevada Crane, Edwinna Areola for episodes of loss of time, initial evaluation was on August 09, 2017.  Summarized the referring note, she has a long history of bilateral hands tremor, head titubation, since 77 years old, taking Inderal, and Xanax for her tremor.  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  She drove her grandchildren to concert in December 4401, had dinner outside, but she has no recollection of the detail, could not remember she has driven, could not remember people sitting around during the concert or the performance.  The memory loss last about 3-4 hours, was noted by her grandchildren repeat multiple questions during those hours.  She denies a history of seizure, stroke, she started taking aspirin 81 mg since that event,  Laboratory evaluation seen November 2018, hematocrit 14.3, creatinine 0.77, LDL was mildly elevated 132, cholesterol was 206, A1c was 5.7  UPDATE June 10th 2019: Follow-up for transient global amnesia She has been doing very well for the past few months, there was no recurrent spells, We have personally reviewed MRI of the brain on August 18, 2017 that was normal Echocardiogram showed no significant abnormality EEG was normal Ultrasound of carotid artery: Right internal carotid artery was normal, left internal carotid artery was less than 39% stenosis   REVIEW OF SYSTEMS: Full 14 system review of systems performed and notable only for as above  ALLERGIES: Allergies  Allergen Reactions  . Penicillins Anaphylaxis    Has  patient had a PCN reaction causing immediate rash, facial/tongue/throat swelling, SOB or lightheadedness with hypotension: Yes Has patient had a PCN reaction causing severe rash involving mucus membranes or skin necrosis: No Has patient had a PCN reaction that required hospitalization Yes Has patient had a PCN reaction occurring within the last 10 years: Yes If all of the above answers are "NO", then may proceed with Cephalosporin use.   . Cephalosporins Other (See Comments)    Due to allergic reaction to penicillin  . Prochlorperazine Edisylate     REACTION: draws mouth to side    HOME MEDICATIONS: Current Outpatient Medications  Medication Sig Dispense Refill  . ALPRAZolam (XANAX) 0.5 MG tablet TAKE ONE TABLET BY MOUTH TWICE DAILY AS NEEDED FOR ANXIETY (Patient taking differently: TAKE ONE TABLET BY MOUTH TWICE DAILY) 180 tablet 0  . aspirin 81 MG tablet Take 81 mg by mouth daily.    Marland Kitchen ibuprofen (ADVIL,MOTRIN) 200 MG tablet Take 800 mg by mouth at bedtime as needed for mild pain or moderate pain.    . mirtazapine (REMERON) 30 MG tablet Take 1 tablet (30 mg total) by mouth at bedtime. 90 tablet 0  . propranolol (INDERAL) 40 MG tablet Take 40 mg by mouth 2 (two) times daily.     No current facility-administered medications for this visit.     PAST MEDICAL HISTORY: Past Medical History:  Diagnosis Date  . Anxiety   . Depression   . Essential tremor     PAST SURGICAL HISTORY: Past Surgical History:  Procedure  Laterality Date  . ABDOMINAL HYSTERECTOMY    . APPENDECTOMY    . breast biopsies Bilateral   . CYST EXCISION N/A 07/10/2016   Procedure: 3CM CYST EXCISION TRUNK;  Surgeon: Aviva Signs, MD;  Location: [facility];  Service: General;  Laterality: N/A;  . FL INJ LEFT KNEE CT ARTHROGRAM ([place] HX)    . TONSILLECTOMY      FAMILY HISTORY: Family History  Problem Relation Age of Onset  . Atrial fibrillation Mother   . COPD Mother   . Atrial fibrillation Father   . Lung cancer  Father   . Atrial fibrillation Sister     SOCIAL HISTORY:  Social History   Socioeconomic History  . Marital status: Widowed    Spouse name: Not on file  . Number of children: 2  . Years of education: college  . Highest education level: Not on file  Occupational History  . Occupation: Retired Animal nutritionist  . Financial resource strain: Not on file  . Food insecurity:    Worry: Not on file    Inability: Not on file  . Transportation needs:    Medical: Not on file    Non-medical: Not on file  Tobacco Use  . Smoking status: Never Smoker  . Smokeless tobacco: Never Used  Substance and Sexual Activity  . Alcohol use: Yes    Comment: occasional glass of wine  . Drug use: No  . Sexual activity: Never  Lifestyle  . Physical activity:    Days per week: Not on file    Minutes per session: Not on file  . Stress: Not on file  Relationships  . Social connections:    Talks on phone: Not on file    Gets together: Not on file    Attends religious service: Not on file    Active member of club or organization: Not on file    Attends meetings of clubs or organizations: Not on file    Relationship status: Not on file  . Intimate partner violence:    Fear of current or ex partner: Not on file    Emotionally abused: Not on file    Physically abused: Not on file    Forced sexual activity: Not on file  Other Topics Concern  . Not on file  Social History Narrative   Lives alone.   Right-handed.   Two children - 1 biological, 1 adopted.   Occasional use of caffeine.     PHYSICAL EXAM   Vitals:   11/19/17 0831  BP: 132/80  Pulse: 76  Weight: 224 lb 4 oz (101.7 kg)  Height: 5\' 8"  (1.727 m)    Not recorded      Body mass index is 34.1 kg/m.  PHYSICAL EXAMNIATION:  Gen: NAD, conversant, well nourised, obese, well groomed                     Cardiovascular: Regular rate rhythm, no peripheral edema, warm, nontender. Eyes: Conjunctivae clear without exudates or  hemorrhage Neck: Supple, no carotid bruits. Pulmonary: Clear to auscultation bilaterally   NEUROLOGICAL EXAM:  MENTAL STATUS: Speech:    Speech is normal; fluent and spontaneous with normal comprehension.  Cognition:     Orientation to time, place and person     Normal recent and remote memory     Normal Attention span and concentration     Normal Language, naming, repeating,spontaneous speech     Fund of knowledge   CRANIAL NERVES: CN  II: Visual fields are full to confrontation. Fundoscopic exam is normal with sharp discs and no vascular changes. Pupils are round equal and briskly reactive to light. CN III, IV, VI: extraocular movement are normal. No ptosis. CN V: Facial sensation is intact to pinprick in all 3 divisions bilaterally. Corneal responses are intact.  CN VII: Face is symmetric with normal eye closure and smile. CN VIII: Hearing is normal to rubbing fingers CN IX, X: Palate elevates symmetrically. Phonation is normal. CN XI: Head turning and shoulder shrug are intact CN XII: Tongue is midline with normal movements and no atrophy.  MOTOR: Bilateral hand posturing tremor, normal muscle tone and strength.  REFLEXES: Reflexes are 2+ and symmetric at the biceps, triceps, knees, and ankles. Plantar responses are flexor.  SENSORY: Intact to light touch, pinprick, positional sensation and vibratory sensation are intact in fingers and toes.  COORDINATION: Rapid alternating movements and fine finger movements are intact. There is no dysmetria on finger-to-nose and heel-knee-shin.    GAIT/STANCE: Posture is normal. Gait is steady with normal steps, base, arm swing, and turning. Heel and toe walking are normal. Tandem gait is normal.  Romberg is absent.   DIAGNOSTIC DATA (LABS, IMAGING, TESTING) - I reviewed patient records, labs, notes, testing and imaging myself where available.   ASSESSMENT AND PLAN  KAILEN NAME is a 77 y.o. female   Transient global amnesia  in December 2018  Extensive evaluation detailed above showed no significant abnormality  Continue aspirin daily, increase water intake  Marcial Pacas, M.D. Ph.D.  [facility] [address] [place], [place] [address] Ph: [phone] Fax: [phone]  CC: Celene Squibb, MD

## 2017-12-24 DIAGNOSIS — H1033 Unspecified acute conjunctivitis, bilateral: Secondary | ICD-10-CM | POA: Diagnosis not present

## 2018-04-13 DIAGNOSIS — E782 Mixed hyperlipidemia: Secondary | ICD-10-CM | POA: Diagnosis not present

## 2018-04-13 DIAGNOSIS — H1033 Unspecified acute conjunctivitis, bilateral: Secondary | ICD-10-CM | POA: Diagnosis not present

## 2018-04-13 DIAGNOSIS — R7301 Impaired fasting glucose: Secondary | ICD-10-CM | POA: Diagnosis not present

## 2018-04-29 DIAGNOSIS — E782 Mixed hyperlipidemia: Secondary | ICD-10-CM | POA: Diagnosis not present

## 2018-04-29 DIAGNOSIS — R251 Tremor, unspecified: Secondary | ICD-10-CM | POA: Diagnosis not present

## 2018-04-29 DIAGNOSIS — N183 Chronic kidney disease, stage 3 (moderate): Secondary | ICD-10-CM | POA: Diagnosis not present

## 2018-04-29 DIAGNOSIS — Z0000 Encounter for general adult medical examination without abnormal findings: Secondary | ICD-10-CM | POA: Diagnosis not present

## 2018-04-29 DIAGNOSIS — R7301 Impaired fasting glucose: Secondary | ICD-10-CM | POA: Diagnosis not present

## 2018-09-10 DIAGNOSIS — G454 Transient global amnesia: Secondary | ICD-10-CM | POA: Diagnosis not present

## 2018-09-10 DIAGNOSIS — G250 Essential tremor: Secondary | ICD-10-CM | POA: Diagnosis not present

## 2018-09-10 DIAGNOSIS — N183 Chronic kidney disease, stage 3 (moderate): Secondary | ICD-10-CM | POA: Diagnosis not present

## 2018-09-10 DIAGNOSIS — R7301 Impaired fasting glucose: Secondary | ICD-10-CM | POA: Diagnosis not present

## 2018-10-23 DIAGNOSIS — Z0000 Encounter for general adult medical examination without abnormal findings: Secondary | ICD-10-CM | POA: Diagnosis not present

## 2018-10-29 ENCOUNTER — Other Ambulatory Visit (HOSPITAL_COMMUNITY): Payer: Self-pay | Admitting: Internal Medicine

## 2018-10-29 DIAGNOSIS — Z1231 Encounter for screening mammogram for malignant neoplasm of breast: Secondary | ICD-10-CM

## 2018-11-08 ENCOUNTER — Ambulatory Visit (HOSPITAL_COMMUNITY): Payer: Medicare Other

## 2018-11-08 DIAGNOSIS — L0390 Cellulitis, unspecified: Secondary | ICD-10-CM | POA: Diagnosis not present

## 2018-11-08 DIAGNOSIS — W57XXXA Bitten or stung by nonvenomous insect and other nonvenomous arthropods, initial encounter: Secondary | ICD-10-CM | POA: Diagnosis not present

## 2018-11-15 ENCOUNTER — Ambulatory Visit (HOSPITAL_COMMUNITY): Payer: Medicare Other

## 2018-11-22 ENCOUNTER — Ambulatory Visit (HOSPITAL_COMMUNITY): Payer: Medicare Other

## 2018-12-12 ENCOUNTER — Ambulatory Visit (HOSPITAL_COMMUNITY)
Admission: RE | Admit: 2018-12-12 | Discharge: 2018-12-12 | Disposition: A | Discharge status: Home / Self Care | Payer: Medicare Other | Source: Ambulatory Visit | Attending: Internal Medicine | Admitting: Internal Medicine

## 2018-12-12 ENCOUNTER — Other Ambulatory Visit: Payer: Self-pay

## 2018-12-12 DIAGNOSIS — Z1231 Encounter for screening mammogram for malignant neoplasm of breast: Secondary | ICD-10-CM | POA: Insufficient documentation

## 2019-04-18 DIAGNOSIS — N183 Chronic kidney disease, stage 3 unspecified: Secondary | ICD-10-CM | POA: Diagnosis not present

## 2019-04-18 DIAGNOSIS — R7301 Impaired fasting glucose: Secondary | ICD-10-CM | POA: Diagnosis not present

## 2019-04-18 DIAGNOSIS — E782 Mixed hyperlipidemia: Secondary | ICD-10-CM | POA: Diagnosis not present

## 2019-04-22 DIAGNOSIS — G250 Essential tremor: Secondary | ICD-10-CM | POA: Diagnosis not present

## 2019-04-22 DIAGNOSIS — R7301 Impaired fasting glucose: Secondary | ICD-10-CM | POA: Diagnosis not present

## 2019-04-22 DIAGNOSIS — G454 Transient global amnesia: Secondary | ICD-10-CM | POA: Diagnosis not present

## 2019-04-22 DIAGNOSIS — Z0000 Encounter for general adult medical examination without abnormal findings: Secondary | ICD-10-CM | POA: Diagnosis not present

## 2019-04-22 DIAGNOSIS — Z23 Encounter for immunization: Secondary | ICD-10-CM | POA: Diagnosis not present

## 2019-10-12 DIAGNOSIS — R519 Headache, unspecified: Secondary | ICD-10-CM | POA: Diagnosis not present

## 2019-10-12 DIAGNOSIS — R05 Cough: Secondary | ICD-10-CM | POA: Diagnosis not present

## 2019-10-12 DIAGNOSIS — R509 Fever, unspecified: Secondary | ICD-10-CM | POA: Diagnosis not present

## 2019-10-12 DIAGNOSIS — Z20822 Contact with and (suspected) exposure to covid-19: Secondary | ICD-10-CM | POA: Diagnosis not present

## 2019-10-12 DIAGNOSIS — R0981 Nasal congestion: Secondary | ICD-10-CM | POA: Diagnosis not present

## 2019-11-03 DIAGNOSIS — E559 Vitamin D deficiency, unspecified: Secondary | ICD-10-CM | POA: Diagnosis not present

## 2019-11-03 DIAGNOSIS — G454 Transient global amnesia: Secondary | ICD-10-CM | POA: Diagnosis not present

## 2019-11-03 DIAGNOSIS — E782 Mixed hyperlipidemia: Secondary | ICD-10-CM | POA: Diagnosis not present

## 2019-11-03 DIAGNOSIS — R7301 Impaired fasting glucose: Secondary | ICD-10-CM | POA: Diagnosis not present

## 2019-11-03 DIAGNOSIS — G250 Essential tremor: Secondary | ICD-10-CM | POA: Diagnosis not present

## 2019-11-07 DIAGNOSIS — N1831 Chronic kidney disease, stage 3a: Secondary | ICD-10-CM | POA: Diagnosis not present

## 2019-11-07 DIAGNOSIS — Z0001 Encounter for general adult medical examination with abnormal findings: Secondary | ICD-10-CM | POA: Diagnosis not present

## 2020-02-17 DIAGNOSIS — Z23 Encounter for immunization: Secondary | ICD-10-CM | POA: Diagnosis not present

## 2020-02-17 DIAGNOSIS — H6692 Otitis media, unspecified, left ear: Secondary | ICD-10-CM | POA: Diagnosis not present

## 2020-02-17 DIAGNOSIS — Z79899 Other long term (current) drug therapy: Secondary | ICD-10-CM | POA: Diagnosis not present

## 2020-02-17 DIAGNOSIS — L0390 Cellulitis, unspecified: Secondary | ICD-10-CM | POA: Diagnosis not present

## 2020-02-17 DIAGNOSIS — N390 Urinary tract infection, site not specified: Secondary | ICD-10-CM | POA: Diagnosis not present

## 2020-02-17 DIAGNOSIS — G250 Essential tremor: Secondary | ICD-10-CM | POA: Diagnosis not present

## 2020-02-17 DIAGNOSIS — Z0000 Encounter for general adult medical examination without abnormal findings: Secondary | ICD-10-CM | POA: Diagnosis not present

## 2020-03-21 DIAGNOSIS — J069 Acute upper respiratory infection, unspecified: Secondary | ICD-10-CM | POA: Diagnosis not present

## 2020-03-21 DIAGNOSIS — Z20822 Contact with and (suspected) exposure to covid-19: Secondary | ICD-10-CM | POA: Diagnosis not present

## 2020-05-13 DIAGNOSIS — H9202 Otalgia, left ear: Secondary | ICD-10-CM | POA: Diagnosis not present

## 2020-05-13 DIAGNOSIS — Z20822 Contact with and (suspected) exposure to covid-19: Secondary | ICD-10-CM | POA: Diagnosis not present

## 2020-05-13 DIAGNOSIS — R519 Headache, unspecified: Secondary | ICD-10-CM | POA: Diagnosis not present

## 2020-05-13 DIAGNOSIS — R059 Cough, unspecified: Secondary | ICD-10-CM | POA: Diagnosis not present

## 2020-05-13 DIAGNOSIS — J019 Acute sinusitis, unspecified: Secondary | ICD-10-CM | POA: Diagnosis not present

## 2020-05-15 DIAGNOSIS — J019 Acute sinusitis, unspecified: Secondary | ICD-10-CM | POA: Diagnosis not present

## 2020-05-15 DIAGNOSIS — R059 Cough, unspecified: Secondary | ICD-10-CM | POA: Diagnosis not present

## 2020-05-15 DIAGNOSIS — R519 Headache, unspecified: Secondary | ICD-10-CM | POA: Diagnosis not present

## 2020-05-15 DIAGNOSIS — Z20822 Contact with and (suspected) exposure to covid-19: Secondary | ICD-10-CM | POA: Diagnosis not present

## 2020-05-15 DIAGNOSIS — R42 Dizziness and giddiness: Secondary | ICD-10-CM | POA: Diagnosis not present

## 2020-05-16 ENCOUNTER — Other Ambulatory Visit: Payer: Self-pay

## 2020-05-16 ENCOUNTER — Emergency Department (HOSPITAL_BASED_OUTPATIENT_CLINIC_OR_DEPARTMENT_OTHER): Payer: Medicare Other

## 2020-05-16 ENCOUNTER — Encounter (HOSPITAL_BASED_OUTPATIENT_CLINIC_OR_DEPARTMENT_OTHER): Payer: Self-pay | Admitting: Emergency Medicine

## 2020-05-16 ENCOUNTER — Emergency Department (HOSPITAL_BASED_OUTPATIENT_CLINIC_OR_DEPARTMENT_OTHER)
Admission: EM | Admit: 2020-05-16 | Discharge: 2020-05-17 | Disposition: A | Discharge status: Home / Self Care | Payer: Medicare Other | Attending: Emergency Medicine | Admitting: Emergency Medicine

## 2020-05-16 DIAGNOSIS — E039 Hypothyroidism, unspecified: Secondary | ICD-10-CM | POA: Insufficient documentation

## 2020-05-16 DIAGNOSIS — J069 Acute upper respiratory infection, unspecified: Secondary | ICD-10-CM | POA: Insufficient documentation

## 2020-05-16 DIAGNOSIS — J40 Bronchitis, not specified as acute or chronic: Secondary | ICD-10-CM | POA: Diagnosis not present

## 2020-05-16 DIAGNOSIS — E119 Type 2 diabetes mellitus without complications: Secondary | ICD-10-CM | POA: Diagnosis not present

## 2020-05-16 DIAGNOSIS — Z7982 Long term (current) use of aspirin: Secondary | ICD-10-CM | POA: Insufficient documentation

## 2020-05-16 DIAGNOSIS — J029 Acute pharyngitis, unspecified: Secondary | ICD-10-CM | POA: Diagnosis not present

## 2020-05-16 DIAGNOSIS — Z20822 Contact with and (suspected) exposure to covid-19: Secondary | ICD-10-CM | POA: Insufficient documentation

## 2020-05-16 DIAGNOSIS — R059 Cough, unspecified: Secondary | ICD-10-CM | POA: Diagnosis not present

## 2020-05-16 DIAGNOSIS — B9789 Other viral agents as the cause of diseases classified elsewhere: Secondary | ICD-10-CM | POA: Diagnosis not present

## 2020-05-16 LAB — RESP PANEL BY RT-PCR (FLU A&B, COVID) ARPGX2
Influenza A by PCR: NEGATIVE
Influenza B by PCR: NEGATIVE
SARS Coronavirus 2 by RT PCR: NEGATIVE

## 2020-05-16 MED ORDER — IPRATROPIUM-ALBUTEROL 0.5-2.5 (3) MG/3ML IN SOLN
3.0000 mL | Freq: Once | RESPIRATORY_TRACT | Status: AC
  Administered 2020-05-16: 3 mL via RESPIRATORY_TRACT
  Filled 2020-05-16: qty 3

## 2020-05-16 NOTE — ED Notes (Signed)
Patient ambulated around the room. Resting HR 98 with oxygen saturations of 95%. Patient maintained a steady gait in no distress vials WNL. Patient tolerated well.

## 2020-05-16 NOTE — ED Provider Notes (Signed)
[facility] EMERGENCY DEPARTMENT Provider Note   CSN: [id number] Arrival date & time: 05/16/20  1821     History Chief Complaint  Patient presents with  . Cough    [name] Brown is a 79 y.o. female.  HPI     This is a 79 year old female with a history of anxiety, depression who presents with 2-week history of cough and recent headache.  Patient reports for the last 2 weeks she has had a nonproductive cough, headache, and sinus pressure and bilateral ear pain.  Patient was seen and evaluated at urgent care on Thursday.  At that time she was told she had clear lungs but likely had an ear infection.  She was started on doxycycline and Tessalon Perles.  She states that she has not improved.  She was seen again in urgent care yesterday and received a steroid injection and an albuterol inhaler.  She has not had any improvement from these treatments.  No known history of smoking or asthma.  She states that her cough is keeping her up at night.  It is making it difficult for her to speak without coughing.  She is not had any chest pain or shortness of breath.  Patient has also noted a headache over the last 5 to 6 days.  She reports is dull and frontal.  She has been taking Tylenol "around-the-clock" with minimal relief.  She also reports to negative PCR Covid test.  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 Past Medical History:  Diagnosis Date  . Anxiety   . Depression   . Essential tremor     Patient Active Problem List   Diagnosis Date Noted  . Tremor 08/09/2017  . Transient global amnesia 08/09/2017  . Memory loss 08/09/2017  . LOM 06/27/2010  . ACUTE BRONCHITIS 06/27/2010  . UTI 04/28/2009  . DM 04/23/2009  . GERD 04/23/2009  . RENAL INSUFFICIENCY 04/23/2009  . DYSPNEA 04/23/2009  . Transient cerebral ischemia 07/21/2008  . RESTING TREMOR 07/21/2008  .  INSOMNIA, CHRONIC 07/08/2007  . Actinic keratosis 01/15/2007  . OSTEOPENIA 10/25/2006  . HYPOTHYROIDISM, UNSPECIFIED 03/20/2006  . HYPERLIPIDEMIA 03/20/2006  . OBESITY, NOS 03/20/2006  . DEPRESSION, MAJOR, RECURRENT 03/20/2006  . ANXIETY 03/20/2006    Past Surgical History:  Procedure Laterality Date  . ABDOMINAL HYSTERECTOMY    . APPENDECTOMY    . breast biopsies Bilateral   . CYST EXCISION N/A 07/10/2016   Procedure: 3CM CYST EXCISION TRUNK;  Surgeon: Aviva Signs, MD;  Location: [facility];  Service: General;  Laterality: N/A;  . FL INJ LEFT KNEE CT ARTHROGRAM ([place] HX)    . TONSILLECTOMY       OB History    Gravida      Para      Term      Preterm      AB      Living  2     SAB      TAB      Ectopic      Multiple      Live Births              Family History  Problem Relation Age of Onset  . Atrial fibrillation Mother   . COPD Mother   . Atrial fibrillation Father   . Lung cancer Father   . Atrial fibrillation Sister  Social History   Tobacco Use  . Smoking status: Never Smoker  . Smokeless tobacco: Never Used  Vaping Use  . Vaping Use: Never used  Substance Use Topics  . Alcohol use: Yes    Comment: occasional glass of wine  . Drug use: No    Home Medications Prior to Admission medications   Medication Sig Start Date End Date Taking? Authorizing Provider  ALPRAZolam (XANAX) 0.5 MG tablet TAKE ONE TABLET BY MOUTH TWICE DAILY AS NEEDED FOR ANXIETY Patient taking differently: TAKE ONE TABLET BY MOUTH TWICE DAILY 12/03/10   Bowen, Collene Leyden, DO  aspirin 81 MG tablet Take 81 mg by mouth daily.    [provider]  ibuprofen (ADVIL,MOTRIN) 200 MG tablet Take 800 mg by mouth at bedtime as needed for mild pain or moderate pain.    [provider]  mirtazapine (REMERON) 30 MG tablet Take 1 tablet (30 mg total) by mouth at bedtime. 04/14/11   Hali Marry, MD  propranolol (INDERAL) 40 MG tablet Take 40 mg by mouth 2 (two)  times daily.    [provider]    Allergies    Penicillins, Cephalosporins, and Prochlorperazine edisylate  Review of Systems   Review of Systems  Constitutional: Positive for fever.  HENT: Positive for ear pain and sinus pressure. Negative for sinus pain.   Respiratory: Positive for cough and wheezing. Negative for shortness of breath.   Cardiovascular: Negative for chest pain.  Gastrointestinal: Negative for abdominal pain, nausea and vomiting.  Genitourinary: Negative for dysuria.  All other systems reviewed and are negative.   Physical Exam Updated Vital Signs BP (!) 147/92   Pulse 72   Temp 98.7 F (37.1 C) (Oral)   Resp 19   Ht 1.727 m (5\' 8" )   Wt 103 kg   SpO2 95%   BMI 34.52 kg/m   Physical Exam Vitals and nursing note reviewed.  Constitutional:      Appearance: She is well-developed. She is not ill-appearing.  HENT:     Head: Normocephalic and atraumatic.     Right Ear: Tympanic membrane normal.     Left Ear: Tympanic membrane normal.     Nose: Nose normal.     Mouth/Throat:     Mouth: Mucous membranes are moist.  Eyes:     Pupils: Pupils are equal, round, and reactive to light.  Neck:     Comments: No meningismus Cardiovascular:     Rate and Rhythm: Normal rate and regular rhythm.     Heart sounds: Normal heart sounds.  Pulmonary:     Effort: Pulmonary effort is normal. No respiratory distress.     Breath sounds: Wheezing present.     Comments: Wheeze with coughing and expiration bilateral bases Abdominal:     General: Bowel sounds are normal.     Palpations: Abdomen is soft.     Tenderness: There is no guarding or rebound.  Musculoskeletal:     Cervical back: Normal range of motion and neck supple.     Right lower leg: No edema.     Left lower leg: No edema.  Skin:    General: Skin is warm and dry.  Neurological:     Mental Status: She is alert and oriented to person, place, and time.  Psychiatric:        Mood and Affect: Mood  normal.     ED Results / Procedures / Treatments   Labs (all labs ordered are listed, but only abnormal  results are displayed) Labs Reviewed  RESP PANEL BY RT-PCR (FLU A&B, COVID) [id number]    EKG None  Radiology DG Chest Port 1 View  Result Date: 05/16/2020 CLINICAL DATA:  Cough and sore throat EXAM: PORTABLE CHEST 1 VIEW COMPARISON:  None. FINDINGS: The heart size and mediastinal contours are within normal limits. Both lungs are clear. The visualized skeletal structures are unremarkable. IMPRESSION: No active disease. Electronically Signed   By: Prudencio Pair M.D.   On: 05/16/2020 20:28    Procedures Procedures (including critical care time)  Medications Ordered in ED Medications  ipratropium-albuterol (DUONEB) 0.5-2.5 (3) MG/3ML nebulizer solution 3 mL (3 mLs Nebulization Given 05/16/20 2321)    ED Course  I have reviewed the triage vital signs and the nursing notes.  Pertinent labs & imaging results that were available during my care of the patient were reviewed by me and considered in my medical decision making (see chart for details).    MDM Rules/Calculators/A&P                          Patient presents with cough and upper respiratory symptoms.  She is overall nontoxic-appearing.  She is actively coughing on exam with some wheeze.  No history of asthma or smoking.  She is afebrile and otherwise her vital signs are largely reassuring.  She is not hypoxic.  Chest x-ray ordered and does not show any evidence of pneumonia or pneumothorax.  Flu and Covid testing are negative.  Suspect viral etiology and likely ongoing bronchitis.  She started steroids this morning and received a steroid injection 24 hours ago.  Suspect this will be very helpful in the next 1 to 2 days.  She was ordered a DuoNeb and was able to ambulate and maintain her pulse ox.  Advised the patient that she may have ongoing cough as sometimes cough can linger for up to 6 weeks with bronchitis.  Continue supportive  measures at home.  Follow-up with PCP if not improving.  After history, exam, and medical workup I feel the patient has been appropriately medically screened and is safe for discharge home. Pertinent diagnoses were discussed with the patient. Patient was given return precautions.   Final Clinical Impression(s) / ED Diagnoses Final diagnoses:  Viral URI with cough  Bronchitis    Rx / DC Orders ED Discharge Orders    None       Solara Goodchild, Barbette Hair, MD 05/17/20 0045

## 2020-05-16 NOTE — ED Triage Notes (Signed)
Pt c/o cough, sore throat,HA and bilateral ear pain x 2 weeks. Pt endorses 2 neg PCR Covid tests but is not feeling better after taking doxycycline and tessalon. Endorses cortisone injection yesterday, and 20 mg oral today

## 2020-05-17 NOTE — Discharge Instructions (Signed)
You were seen today for ongoing cough and upper respiratory symptoms.  Continue your medications as prescribed.  Hopefully the steroids will take effect in the next 1 to 2 days.  Use honey as a cough suppressant.  Follow-up with your primary physician if not improved in 2 to 3 days.

## 2020-06-02 DIAGNOSIS — Z79899 Other long term (current) drug therapy: Secondary | ICD-10-CM | POA: Diagnosis not present

## 2020-06-02 DIAGNOSIS — Z23 Encounter for immunization: Secondary | ICD-10-CM | POA: Diagnosis not present

## 2020-06-02 DIAGNOSIS — L0390 Cellulitis, unspecified: Secondary | ICD-10-CM | POA: Diagnosis not present

## 2020-06-02 DIAGNOSIS — R7301 Impaired fasting glucose: Secondary | ICD-10-CM | POA: Diagnosis not present

## 2020-06-02 DIAGNOSIS — E782 Mixed hyperlipidemia: Secondary | ICD-10-CM | POA: Diagnosis not present

## 2020-06-02 DIAGNOSIS — G250 Essential tremor: Secondary | ICD-10-CM | POA: Diagnosis not present

## 2020-06-02 DIAGNOSIS — H6692 Otitis media, unspecified, left ear: Secondary | ICD-10-CM | POA: Diagnosis not present

## 2020-06-02 DIAGNOSIS — Z0000 Encounter for general adult medical examination without abnormal findings: Secondary | ICD-10-CM | POA: Diagnosis not present

## 2020-06-03 DIAGNOSIS — E782 Mixed hyperlipidemia: Secondary | ICD-10-CM | POA: Diagnosis not present

## 2020-06-03 DIAGNOSIS — R7301 Impaired fasting glucose: Secondary | ICD-10-CM | POA: Diagnosis not present

## 2020-06-03 DIAGNOSIS — N1831 Chronic kidney disease, stage 3a: Secondary | ICD-10-CM | POA: Diagnosis not present

## 2020-06-03 DIAGNOSIS — G454 Transient global amnesia: Secondary | ICD-10-CM | POA: Diagnosis not present

## 2020-06-03 DIAGNOSIS — G250 Essential tremor: Secondary | ICD-10-CM | POA: Diagnosis not present

## 2020-07-03 DIAGNOSIS — U071 COVID-19: Secondary | ICD-10-CM | POA: Diagnosis not present

## 2020-07-27 ENCOUNTER — Other Ambulatory Visit: Payer: Self-pay

## 2020-07-28 ENCOUNTER — Ambulatory Visit (INDEPENDENT_AMBULATORY_CARE_PROVIDER_SITE_OTHER): Payer: Medicare Other | Admitting: Medical

## 2020-07-28 ENCOUNTER — Other Ambulatory Visit: Payer: Self-pay

## 2020-07-28 ENCOUNTER — Encounter: Payer: Self-pay | Admitting: Medical

## 2020-07-28 VITALS — BP 150/80 | HR 98 | Temp 98.2°F | Resp 18 | Ht 68.0 in | Wt 223.0 lb

## 2020-07-28 DIAGNOSIS — Z79899 Other long term (current) drug therapy: Secondary | ICD-10-CM | POA: Diagnosis not present

## 2020-07-28 DIAGNOSIS — R739 Hyperglycemia, unspecified: Secondary | ICD-10-CM

## 2020-07-28 DIAGNOSIS — E559 Vitamin D deficiency, unspecified: Secondary | ICD-10-CM | POA: Diagnosis not present

## 2020-07-28 DIAGNOSIS — F411 Generalized anxiety disorder: Secondary | ICD-10-CM | POA: Diagnosis not present

## 2020-07-28 DIAGNOSIS — E785 Hyperlipidemia, unspecified: Secondary | ICD-10-CM | POA: Diagnosis not present

## 2020-07-28 DIAGNOSIS — F32A Depression, unspecified: Secondary | ICD-10-CM | POA: Diagnosis not present

## 2020-07-28 NOTE — Patient Instructions (Addendum)
Nice to meet you today.  History of depression and appears stable presently.  Continue Remeron 30 mg at night.  If mood worsens or changes then consider referral to psychologist based on your history.  History of anxiety we will continue prescribing Xanax.  You have present prescription.  When you run out of current meds will refill.  I will have you sign controlled medication contract and get UDS today.  History of benign familial tremor.  Continue propanolol 40 mg daily.  History of vitamin D deficiency.  Will get vitamin D level today.  History of hyperlipidemia and elevated sugar.  Will get CMP, lipid panel and A1c today.  Your blood pressure is elevated today but December at your former PCP office blood pressure was 122/77.  Want to get over-the-counter blood pressure cuff and check blood pressure over the next 3 days.  Then send me a MyChart update or call the office with BP readings.  Important to know if you are hypertensive as that would affect her cardiovascular risk score.  Follow-up in 1 month or as needed.

## 2020-07-28 NOTE — Progress Notes (Signed)
Subjective:    Patient ID: [name] Brown, female    DOB: 02/09/1941, 80 y.o.   MRN: [id number]  HPI  Pt in for first time.   Former pcp was in [place]. She moved to [place] about 1.5 years ago and traveling to [place] was too far.  Pt is retired Marine scientist and familiar/comfortable with [employer]. She retired 12 years ago when daughter got breast cancer.  Pt has  Hx of depression. She described when husband died struggled with depression and had some insomonia. She used remeron and worked well.   Pt states has been on xanax 25 years ago. Told was taken for tremors. Pt mentions if does not have meds will get anxious.  Pt has hx of tremor and is on propananol.  Pt had tremors since 80 years old. No cause found and she was told familial.  Pt does have history of high cholesterol last total was 206. Does not want to be on statin but will do fish oil.  Pt bp is elevated today but last visit to MD in dec 122/77.   Pt denies that she is diabetic though in epic indicates is. Hx of hgh sugar but a1c not in diabetic range.  Hx of low vit D.   Review of Systems  Constitutional: Negative for chills, fatigue and fever.  Respiratory: Negative for cough, chest tightness, shortness of breath and wheezing.   Cardiovascular: Negative for chest pain and palpitations.  Gastrointestinal: Negative for abdominal pain.  Genitourinary: Negative for dysuria.  Musculoskeletal: Negative for back pain and joint swelling.  Skin: Negative for rash.  Neurological: Negative for dizziness, syncope, weakness, numbness and headaches.  Hematological: Negative for adenopathy. Does not bruise/bleed easily.  Psychiatric/Behavioral: Positive for dysphoric mood. Negative for behavioral problems, decreased concentration, hallucinations, self-injury and suicidal ideas. The patient is nervous/anxious.     Past Medical History:  Diagnosis Date  . Anxiety   . Depression   . Essential tremor      Social History    Socioeconomic History  . Marital status: Widowed    Spouse name: Not on file  . Number of children: 2  . Years of education: college  . Highest education level: Not on file  Occupational History  . Occupation: Retired Therapist, sports  Tobacco Use  . Smoking status: Never Smoker  . Smokeless tobacco: Never Used  Vaping Use  . Vaping Use: Never used  Substance and Sexual Activity  . Alcohol use: Yes    Comment: occasional glass of wine  . Drug use: No  . Sexual activity: Never  Other Topics Concern  . Not on file  Social History Narrative   Lives alone.   Right-handed.   Two children - 1 biological, 1 adopted.   Occasional use of caffeine.   Social Determinants of Health   Financial Resource Strain: Not on file  Food Insecurity: Not on file  Transportation Needs: Not on file  Physical Activity: Not on file  Stress: Not on file  Social Connections: Not on file  Intimate Partner Violence: Not on file    Past Surgical History:  Procedure Laterality Date  . ABDOMINAL HYSTERECTOMY    . APPENDECTOMY    . breast biopsies Bilateral   . CYST EXCISION N/A 07/10/2016   Procedure: 3CM CYST EXCISION TRUNK;  Surgeon: Aviva Signs, MD;  Location: [facility];  Service: General;  Laterality: N/A;  . FL INJ LEFT KNEE CT ARTHROGRAM ([place] HX)    . TONSILLECTOMY  Family History  Problem Relation Age of Onset  . Atrial fibrillation Mother   . COPD Mother   . Atrial fibrillation Father   . Lung cancer Father   . Atrial fibrillation Sister     Allergies  Allergen Reactions  . Penicillins Anaphylaxis    Has patient had a PCN reaction causing immediate rash, facial/tongue/throat swelling, SOB or lightheadedness with hypotension: Yes Has patient had a PCN reaction causing severe rash involving mucus membranes or skin necrosis: No Has patient had a PCN reaction that required hospitalization Yes Has patient had a PCN reaction occurring within the last 10 years: Yes If all of the above answers  are "NO", then may proceed with Cephalosporin use.   . Cephalosporins Other (See Comments)    Due to allergic reaction to penicillin  . Prochlorperazine Edisylate     REACTION: draws mouth to side    Current Outpatient Medications on File Prior to Visit  Medication Sig Dispense Refill  . ALPRAZolam (XANAX) 0.5 MG tablet TAKE ONE TABLET BY MOUTH TWICE DAILY AS NEEDED FOR ANXIETY 180 tablet 0  . aspirin 81 MG tablet Take 81 mg by mouth daily.    Marland Kitchen ibuprofen (ADVIL,MOTRIN) 200 MG tablet Take 800 mg by mouth at bedtime as needed for mild pain or moderate pain.    . mirtazapine (REMERON) 30 MG tablet Take 1 tablet (30 mg total) by mouth at bedtime. 90 tablet 0  . propranolol (INDERAL) 40 MG tablet Take 40 mg by mouth 2 (two) times daily.     No current facility-administered medications on file prior to visit.    BP (!) 150/80   Pulse 98   Temp 98.2 F (36.8 C) (Oral)   Resp 18   Ht 5\' 8"  (1.727 m)   Wt 223 lb (101.2 kg)   SpO2 97%   BMI 33.91 kg/m      Objective:   Physical Exam  General Mental Status- Alert. General Appearance- Not in acute distress.   Skin General: Color- Normal Color. Moisture- Normal Moisture.  Neck Carotid Arteries- Normal color. Moisture- Normal Moisture. No carotid bruits. No JVD.  Chest and Lung Exam Auscultation: Breath Sounds:-Normal.  Cardiovascular Auscultation:Rythm- Regular. Murmurs & Other Heart Sounds:Auscultation of the heart reveals- No Murmurs.  Abdomen Inspection:-Inspeection Normal. Palpation/Percussion:Note:No mass. Palpation and Percussion of the abdomen reveal- Non Tender, Non Distended + BS, no rebound or guarding.    Neurologic Cranial Nerve exam:- CN III-XII intact(No nystagmus), symmetric smile. Strength:- 5/5 equal and symmetric strength both upper and lower extremities.      Assessment & Plan:  Nice to meet you today.  History of depression and appears stable presently.  Continue Remeron 30 mg at night.  If  mood worsens or changes then consider referral to psychologist based on your history.  History of anxiety we will continue prescribing Xanax.  You have present prescription.  When you run out of current meds will refill.  I will have you sign controlled medication contract and get UDS today.  History of benign familial tremor.  Continue propanolol 40 mg daily.  History of vitamin D deficiency.  Will get vitamin D level today.  History of hyperlipidemia and elevated sugar.  Will get CMP, lipid panel and A1c today.  Your blood pressure is elevated today but December at your former PCP office blood pressure was 122/77.  Want to get over-the-counter blood pressure cuff and check blood pressure over the next 3 days.  Then send me a MyChart update  or call the office with BP readings.  Important to know if you are hypertensive as that would affect her cardiovascular risk score.  Follow-up in 1 month or as needed.  Mackie Pai, PA-C

## 2020-07-28 NOTE — Addendum Note (Signed)
Addended by: Jeronimo Greaves on: 07/28/2020 03:18 PM   Modules accepted: Orders

## 2020-07-29 LAB — COMPREHENSIVE METABOLIC PANEL
ALT: 13 U/L (ref 0–35)
AST: 16 U/L (ref 0–37)
Albumin: 4.2 g/dL (ref 3.5–5.2)
Alkaline Phosphatase: 79 U/L (ref 39–117)
BUN: 18 mg/dL (ref 6–23)
CO2: 27 mEq/L (ref 19–32)
Calcium: 9.6 mg/dL (ref 8.4–10.5)
Chloride: 106 mEq/L (ref 96–112)
Creatinine, Ser: 0.87 mg/dL (ref 0.40–1.20)
GFR: 64.2 mL/min (ref >60.00)
Glucose, Bld: 96 mg/dL (ref 70–99)
Potassium: 4.3 mEq/L (ref 3.5–5.1)
Sodium: 141 mEq/L (ref 135–145)
Total Bilirubin: 0.6 mg/dL (ref 0.2–1.2)
Total Protein: 7 g/dL (ref 6.0–8.3)

## 2020-07-29 LAB — LIPID PANEL
Cholesterol: 219 mg/dL — ABNORMAL HIGH (ref 0–200)
HDL: 43.7 mg/dL (ref >39.00)
NonHDL: 175.22
Total CHOL/HDL Ratio: 5
Triglycerides: 252 mg/dL — ABNORMAL HIGH (ref 0.0–149.0)
VLDL: 50.4 mg/dL — ABNORMAL HIGH (ref 0.0–40.0)

## 2020-07-29 LAB — HEMOGLOBIN A1C: Hgb A1c MFr Bld: 5.7 % (ref 4.6–6.5)

## 2020-07-29 LAB — LDL CHOLESTEROL, DIRECT: Direct LDL: 140 mg/dL

## 2020-07-30 LAB — DRUG MONITORING, PANEL 8 WITH CONFIRMATION, URINE
6 Acetylmorphine: NEGATIVE ng/mL (ref <10)
Alcohol Metabolites: NEGATIVE ng/mL
Alphahydroxyalprazolam: 348 ng/mL — ABNORMAL HIGH (ref <25)
Alphahydroxymidazolam: NEGATIVE ng/mL (ref <50)
Alphahydroxytriazolam: NEGATIVE ng/mL (ref <50)
Aminoclonazepam: NEGATIVE ng/mL (ref <25)
Amphetamines: NEGATIVE ng/mL (ref <500)
Benzodiazepines: POSITIVE ng/mL — AB (ref <100)
Buprenorphine, Urine: NEGATIVE ng/mL (ref <5)
Cocaine Metabolite: NEGATIVE ng/mL (ref <150)
Creatinine: 152.7 mg/dL
Hydroxyethylflurazepam: NEGATIVE ng/mL (ref <50)
Lorazepam: NEGATIVE ng/mL (ref <50)
MDMA: NEGATIVE ng/mL (ref <500)
Marijuana Metabolite: NEGATIVE ng/mL (ref <20)
Nordiazepam: NEGATIVE ng/mL (ref <50)
Opiates: NEGATIVE ng/mL (ref <100)
Oxazepam: NEGATIVE ng/mL (ref <50)
Oxidant: NEGATIVE ug/mL
Oxycodone: NEGATIVE ng/mL (ref <100)
Temazepam: NEGATIVE ng/mL (ref <50)
pH: 5.2 (ref 4.5–9.0)

## 2020-07-30 LAB — DM TEMPLATE

## 2020-08-13 ENCOUNTER — Telehealth: Payer: Self-pay | Admitting: Medical

## 2020-08-13 NOTE — Telephone Encounter (Signed)
Those are better reading than in office. Remind pt on day of office visits to check bp in morning. We can use that for vitals on day of visit. She appear to have some whit coat elevated bp.

## 2020-08-13 NOTE — Telephone Encounter (Signed)
CallerTammi Boulier Call Back @ [phone]  Patient is calling to give blood pressure readings  133/78 136/69 140/76 134/75 139/83 138/84  Please Advise

## 2020-08-16 ENCOUNTER — Telehealth: Payer: Self-pay | Admitting: Medical

## 2020-08-16 MED ORDER — ALPRAZOLAM 0.5 MG PO TABS: 0.5000 mg | ORAL_TABLET | Freq: Two times a day (BID) | ORAL | 0 refills | Status: DC | PRN

## 2020-08-16 NOTE — Telephone Encounter (Signed)
Pt has appt on 3/16 will do UDS and contract then

## 2020-08-16 NOTE — Telephone Encounter (Signed)
I sent in 2 weeks supply of xanax. I had asked her to follow up in one month which should be middle of this month. She can keep that appt and sign contract and give uds on that date. Not sure if she has already signed contract and give uds.

## 2020-08-16 NOTE — Telephone Encounter (Signed)
Medication: ALPRAZolam (XANAX) 0.5 MG tablet    Has the patient contacted their pharmacy? No. (If no, request that the patient contact the pharmacy for the refill.) (If yes, when and what did the pharmacy advise?)  Preferred Pharmacy (with phone number or street name):  [id number] - [place], [place] - [address] AT [place] Phone:  [phone]  Fax:  [phone]       Agent: Please be advised that RX refills may take up to 3 business days. We ask that you follow-up with your pharmacy.

## 2020-08-20 ENCOUNTER — Telehealth: Payer: Self-pay | Admitting: Medical

## 2020-08-20 MED ORDER — PROPRANOLOL HCL 40 MG PO TABS: 40.0000 mg | ORAL_TABLET | Freq: Two times a day (BID) | ORAL | 3 refills | Status: DC

## 2020-08-20 NOTE — Telephone Encounter (Signed)
Sent in 2 rx of propanolol today. I sent to [facility] correct pharmacy after accidentally sending to other since [place]  was not loaded. Can you call other pharmacy and cancel.

## 2020-08-20 NOTE — Telephone Encounter (Signed)
Rx propanolol refill sent to pt pharmacy.

## 2020-08-20 NOTE — Telephone Encounter (Signed)
Medication: propranolol (INDERAL) 40 MG tablet [[id number]]     Has the patient contacted their pharmacy? no (If no, request that the patient contact the pharmacy for the refill.) (If yes, when and what did the pharmacy advise?)    Preferred Pharmacy (with phone number or street name):  [id number] - [place], [place] - [address] AT [place] Phone:  [phone]  Fax:  [phone]         Agent: Please be advised that RX refills may take up to 3 business days. We ask that you follow-up with your pharmacy.

## 2020-08-20 NOTE — Telephone Encounter (Signed)
Pt called and left voicemail to about medication being sent

## 2020-08-25 ENCOUNTER — Encounter: Payer: Self-pay | Admitting: Medical

## 2020-08-25 ENCOUNTER — Telehealth (INDEPENDENT_AMBULATORY_CARE_PROVIDER_SITE_OTHER): Payer: Medicare Other | Admitting: Medical

## 2020-08-25 ENCOUNTER — Other Ambulatory Visit: Payer: Self-pay

## 2020-08-25 VITALS — BP 119/72 | HR 96

## 2020-08-25 DIAGNOSIS — E785 Hyperlipidemia, unspecified: Secondary | ICD-10-CM

## 2020-08-25 DIAGNOSIS — R739 Hyperglycemia, unspecified: Secondary | ICD-10-CM

## 2020-08-25 DIAGNOSIS — I10 Essential (primary) hypertension: Secondary | ICD-10-CM | POA: Diagnosis not present

## 2020-08-25 DIAGNOSIS — F411 Generalized anxiety disorder: Secondary | ICD-10-CM

## 2020-08-25 DIAGNOSIS — F32A Depression, unspecified: Secondary | ICD-10-CM

## 2020-08-25 NOTE — Progress Notes (Signed)
Subjective:    Patient ID: [name] Brown, female    DOB: 01-Nov-1940, 80 y.o.   MRN: [id number]  HPI  Virtual Visit via Video Note  I connected with [name] Brown on 08/25/20 at  4:00 PM EDT by a video enabled telemedicine application and verified that I am speaking with the correct person using two identifiers.  Location: Patient: home Provider: office.  Participants- pt and myself.   I discussed the limitations of evaluation and management by telemedicine and the availability of in person appointments. The patient expressed understanding and agreed to proceed.  Video connection established. I could not hear pt?  Had to call on land line. Video connection maintained.  History of Present Illness:   I had wanted pt to have office appointment.  Pt sent in bp readings. One reading was little high 140/82. 138/84, 139/83, 134/75, 136/69 and  133/78. Pt is on propanol twice a day. rx'd for tremors.   Pt has high cholesterol. She wants to avoid statin. We discussed he cardiovascular risk score. She only want to do diet and fish oil for one month and then recheck.  4 weeks ago a1c was well controlled. Average is about 114.    Pt mood controlled. Still taking remeron. Hx of anxiety and still taking xanax. I did refill that recently. Pt signed contract and gave uds on day of visit.  Observations/Objective: General-no acute distress, pleasant, oriented. Lungs- on inspection lungs appear unlabored. Neck- no tracheal deviation or jvd on inspection. Neuro- gross motor function appears intact.  Assessment and Plan: Controlled bp levels with hx of tremors. Recommend continue propanolol 40 mg twice daily. If bp increases above 140/90 would add low dose ARB or calcium channel blocker.   For high cholesterol eat low cholesterol diet. ASCVD score explained today but still declined statin or other prescription med for cholesterol. In one month repeat fasting lipid panel.  For elevated  sugar in prediabetic range recommend low sugar diet. Follow a1c every 3 months.  For depression and anxiety continue remeron and xanax.  Follow up in one month or as needed  Follow Up Instructions:    I discussed the assessment and treatment plan with the patient. The patient was provided an opportunity to ask questions and all were answered. The patient agreed with the plan and demonstrated an understanding of the instructions.   The patient was advised to call back or seek an in-person evaluation if the symptoms worsen or if the condition fails to improve as anticipated.  Time spent with patient today was  30 minutes which consisted of chart rediew, discussing diagnosis, work up, treatment and documentation.   Mackie Pai, PA-C   Review of Systems  Constitutional: Negative for chills, fatigue and fever.  Respiratory: Negative for cough, chest tightness, shortness of breath and wheezing.   Cardiovascular: Negative for chest pain.  Gastrointestinal: Negative for abdominal pain, blood in stool, diarrhea and vomiting.  Genitourinary: Negative for dysuria and flank pain.  Musculoskeletal: Negative for back pain.  Skin: Negative for rash.  Neurological: Negative for dizziness and headaches.  Hematological: Negative for adenopathy. Does not bruise/bleed easily.  Psychiatric/Behavioral: Negative for behavioral problems and dysphoric mood. The patient is not nervous/anxious.        Mood and anxiety controlled.     Past Medical History:  Diagnosis Date  . Anxiety   . Depression   . Essential tremor   . Hyperlipidemia      Social History   Socioeconomic  History  . Marital status: Widowed    Spouse name: Not on file  . Number of children: 2  . Years of education: college  . Highest education level: Not on file  Occupational History  . Occupation: Retired Therapist, sports  Tobacco Use  . Smoking status: Never Smoker  . Smokeless tobacco: Never Used  Vaping Use  . Vaping Use: Never  used  Substance and Sexual Activity  . Alcohol use: Yes    Comment: occasional glass of wine  . Drug use: No  . Sexual activity: Never  Other Topics Concern  . Not on file  Social History Narrative   Lives alone.   Right-handed.   Two children - 1 biological, 1 adopted.   Occasional use of caffeine.   Social Determinants of Health   Financial Resource Strain: Not on file  Food Insecurity: Not on file  Transportation Needs: Not on file  Physical Activity: Not on file  Stress: Not on file  Social Connections: Not on file  Intimate Partner Violence: Not on file    Past Surgical History:  Procedure Laterality Date  . ABDOMINAL HYSTERECTOMY    . APPENDECTOMY    . breast biopsies Bilateral   . CYST EXCISION N/A 07/10/2016   Procedure: 3CM CYST EXCISION TRUNK;  Surgeon: Aviva Signs, MD;  Location: [facility];  Service: General;  Laterality: N/A;  . FL INJ LEFT KNEE CT ARTHROGRAM ([place] HX)    . TONSILLECTOMY      Family History  Problem Relation Age of Onset  . Atrial fibrillation Mother   . COPD Mother   . Atrial fibrillation Father   . Lung cancer Father   . Atrial fibrillation Sister     Allergies  Allergen Reactions  . Penicillins Anaphylaxis    Has patient had a PCN reaction causing immediate rash, facial/tongue/throat swelling, SOB or lightheadedness with hypotension: Yes Has patient had a PCN reaction causing severe rash involving mucus membranes or skin necrosis: No Has patient had a PCN reaction that required hospitalization Yes Has patient had a PCN reaction occurring within the last 10 years: Yes If all of the above answers are "NO", then may proceed with Cephalosporin use.   . Cephalosporins Other (See Comments)    Due to allergic reaction to penicillin  . Prochlorperazine Edisylate     REACTION: draws mouth to side    Current Outpatient Medications on File Prior to Visit  Medication Sig Dispense Refill  . ALPRAZolam (XANAX) 0.5 MG tablet Take 1 tablet  (0.5 mg total) by mouth 2 (two) times daily as needed for anxiety. 30 tablet 0  . aspirin 81 MG tablet Take 81 mg by mouth daily.    Marland Kitchen ibuprofen (ADVIL,MOTRIN) 200 MG tablet Take 800 mg by mouth at bedtime as needed for mild pain or moderate pain.    . mirtazapine (REMERON) 30 MG tablet Take 1 tablet (30 mg total) by mouth at bedtime. 90 tablet 0  . propranolol (INDERAL) 40 MG tablet Take 1 tablet (40 mg total) by mouth 2 (two) times daily. 60 tablet 3   No current facility-administered medications on file prior to visit.    There were no vitals taken for this visit.      Objective:   Physical Exam        Assessment & Plan:

## 2020-08-25 NOTE — Patient Instructions (Signed)
Controlled bp levels with hx of tremors. Recommend continue propanolol 40 mg twice daily. If bp increases above 140/90 would add low dose ARB or calcium channel blocker.   For high cholesterol eat low cholesterol diet. ASCVD score explained today but still declined statin or other prescription med for cholesterol. In one month repeat fasting lipid panel.  For elevated sugar in prediabetic range recommend low sugar diet. Follow a1c every 3 months.  For depression and anxiety continue remeron and xanax.  Follow up in one month or as needed

## 2020-09-02 ENCOUNTER — Ambulatory Visit: Payer: Medicare Other | Admitting: Medical

## 2020-09-08 ENCOUNTER — Telehealth: Payer: Self-pay | Admitting: Medical

## 2020-09-08 MED ORDER — MIRTAZAPINE 30 MG PO TABS: 30.0000 mg | ORAL_TABLET | Freq: Every day | ORAL | 2 refills | Status: DC

## 2020-09-08 NOTE — Telephone Encounter (Signed)
I refilled pt remeron today.

## 2020-09-08 NOTE — Telephone Encounter (Signed)
Refilled pt remeron.

## 2020-09-08 NOTE — Telephone Encounter (Signed)
Medication: mirtazapine (REMERON) 30 MG tablet [[id number]]       Has the patient contacted their pharmacy?  (If no, request that the patient contact the pharmacy for the refill.) (If yes, when and what did the pharmacy advise?)     Preferred Pharmacy (with phone number or street name):  [id number] - [place], [place] - [address] [place] Phone:  [phone]  Fax:  [phone]          Agent: Please be advised that RX refills may take up to 3 business days. We ask that you follow-up with your pharmacy.

## 2020-09-30 ENCOUNTER — Other Ambulatory Visit (INDEPENDENT_AMBULATORY_CARE_PROVIDER_SITE_OTHER): Payer: Medicare Other

## 2020-09-30 ENCOUNTER — Other Ambulatory Visit: Payer: Self-pay | Admitting: Medical

## 2020-09-30 ENCOUNTER — Other Ambulatory Visit: Payer: Self-pay

## 2020-09-30 ENCOUNTER — Telehealth: Payer: Self-pay | Admitting: Medical

## 2020-09-30 ENCOUNTER — Ambulatory Visit (INDEPENDENT_AMBULATORY_CARE_PROVIDER_SITE_OTHER): Payer: Medicare Other | Admitting: Medical

## 2020-09-30 VITALS — BP 127/74 | HR 69 | Temp 98.4°F | Resp 20 | Ht 68.0 in | Wt 227.6 lb

## 2020-09-30 DIAGNOSIS — E559 Vitamin D deficiency, unspecified: Secondary | ICD-10-CM

## 2020-09-30 DIAGNOSIS — R739 Hyperglycemia, unspecified: Secondary | ICD-10-CM

## 2020-09-30 DIAGNOSIS — E785 Hyperlipidemia, unspecified: Secondary | ICD-10-CM

## 2020-09-30 DIAGNOSIS — I10 Essential (primary) hypertension: Secondary | ICD-10-CM | POA: Diagnosis not present

## 2020-09-30 LAB — LIPID PANEL
Cholesterol: 191 mg/dL (ref 0–200)
HDL: 41.6 mg/dL (ref >39.00)
NonHDL: 149.72
Total CHOL/HDL Ratio: 5
Triglycerides: 201 mg/dL — ABNORMAL HIGH (ref 0.0–149.0)
VLDL: 40.2 mg/dL — ABNORMAL HIGH (ref 0.0–40.0)

## 2020-09-30 LAB — LDL CHOLESTEROL, DIRECT: Direct LDL: 133 mg/dL

## 2020-09-30 LAB — VITAMIN D 25 HYDROXY (VIT D DEFICIENCY, FRACTURES): VITD: 22.92 ng/mL — ABNORMAL LOW (ref 30.00–100.00)

## 2020-09-30 MED ORDER — ALPRAZOLAM 0.5 MG PO TABS: 0.5000 mg | ORAL_TABLET | Freq: Two times a day (BID) | ORAL | 0 refills | Status: DC | PRN

## 2020-09-30 MED ORDER — VITAMIN D (ERGOCALCIFEROL) 1.25 MG (50000 UNIT) PO CAPS: 50000.0000 [IU] | ORAL_CAPSULE | ORAL | 0 refills | Status: DC

## 2020-09-30 MED ORDER — ATORVASTATIN CALCIUM 10 MG PO TABS: 10.0000 mg | ORAL_TABLET | Freq: Every day | ORAL | 3 refills | Status: DC

## 2020-09-30 NOTE — Addendum Note (Signed)
Addended by: Kelle Darting A on: 09/30/2020 07:58 AM   Modules accepted: Orders

## 2020-09-30 NOTE — Telephone Encounter (Signed)
Rx atorvastatin and vit d sent to pt pharmacy.

## 2020-09-30 NOTE — Telephone Encounter (Signed)
Opened to review 

## 2020-09-30 NOTE — Progress Notes (Signed)
Pt has appt with [name] at 8:40 and wanted labs prior to that appt. Orders cancelled and reordered to be released in PCP's office visit encounter. Lab appt not needed since pt is seeing PCP today.

## 2020-09-30 NOTE — Patient Instructions (Addendum)
Your bp is mostly well controlled. Would could continue propanolol for tremors and will help control bp. If bp increases can add different type med.  For high cholesterol will repeat lipid panel.   Getting vit D level for low vit d hx and osteopenia.  For anxiety refilled xanax rx.   Vaccine counseling on covid.  Follow up 3 months or as needed

## 2020-09-30 NOTE — Progress Notes (Signed)
Subjective:    Patient ID: [name] Brown, female    DOB: 15-Sep-1940, 80 y.o.   MRN: [id number]  HPI  Pt brings in 14 bp readings. 12 of readings are in rang 119/72 to 130's/70. Majority 130/70 range. Only 2 140/85. Pt only on propanolol 40 mg twice daily. Rx in past for tremors.  Hx of high cholesterol. Pt is fasting today.   Hx of low vit d. Will repeat level today.  Hx of anxiety. She is on xanax. Pt is up to date on contract and uds.   Review of Systems  Constitutional: Negative for chills, diaphoresis and fever.  Respiratory: Negative for chest tightness and shortness of breath.   Cardiovascular: Negative for chest pain and palpitations.  Gastrointestinal: Negative for abdominal pain.  Genitourinary: Negative for dysuria.  Musculoskeletal: Negative for back pain.  Skin: Negative for rash.  Neurological: Negative for dizziness.  Hematological: Negative for adenopathy.    Past Medical History:  Diagnosis Date  . Anxiety   . Depression   . Essential tremor   . Hyperlipidemia      Social History   Socioeconomic History  . Marital status: Widowed    Spouse name: Not on file  . Number of children: 2  . Years of education: college  . Highest education level: Not on file  Occupational History  . Occupation: Retired Therapist, sports  Tobacco Use  . Smoking status: Never Smoker  . Smokeless tobacco: Never Used  Vaping Use  . Vaping Use: Never used  Substance and Sexual Activity  . Alcohol use: Yes    Comment: occasional glass of wine  . Drug use: No  . Sexual activity: Never  Other Topics Concern  . Not on file  Social History Narrative   Lives alone.   Right-handed.   Two children - 1 biological, 1 adopted.   Occasional use of caffeine.   Social Determinants of Health   Financial Resource Strain: Not on file  Food Insecurity: Not on file  Transportation Needs: Not on file  Physical Activity: Not on file  Stress: Not on file  Social Connections: Not on file   Intimate Partner Violence: Not on file    Past Surgical History:  Procedure Laterality Date  . ABDOMINAL HYSTERECTOMY    . APPENDECTOMY    . breast biopsies Bilateral   . CYST EXCISION N/A 07/10/2016   Procedure: 3CM CYST EXCISION TRUNK;  Surgeon: Aviva Signs, MD;  Location: [facility];  Service: General;  Laterality: N/A;  . FL INJ LEFT KNEE CT ARTHROGRAM ([place] HX)    . TONSILLECTOMY      Family History  Problem Relation Age of Onset  . Atrial fibrillation Mother   . COPD Mother   . Atrial fibrillation Father   . Lung cancer Father   . Atrial fibrillation Sister     Allergies  Allergen Reactions  . Penicillins Anaphylaxis    Has patient had a PCN reaction causing immediate rash, facial/tongue/throat swelling, SOB or lightheadedness with hypotension: Yes Has patient had a PCN reaction causing severe rash involving mucus membranes or skin necrosis: No Has patient had a PCN reaction that required hospitalization Yes Has patient had a PCN reaction occurring within the last 10 years: Yes If all of the above answers are "NO", then may proceed with Cephalosporin use.   . Cephalosporins Other (See Comments)    Due to allergic reaction to penicillin  . Prochlorperazine Edisylate     REACTION: draws mouth to side  Current Outpatient Medications on File Prior to Visit  Medication Sig Dispense Refill  . Omega-3 Fatty Acids (FISH OIL) 1000 MG CAPS Take by mouth.    . ALPRAZolam (XANAX) 0.5 MG tablet Take 1 tablet (0.5 mg total) by mouth 2 (two) times daily as needed for anxiety. 30 tablet 0  . aspirin 81 MG tablet Take 81 mg by mouth daily.    Marland Kitchen ibuprofen (ADVIL,MOTRIN) 200 MG tablet Take 800 mg by mouth at bedtime as needed for mild pain or moderate pain.    . mirtazapine (REMERON) 30 MG tablet Take 1 tablet (30 mg total) by mouth at bedtime. 30 tablet 2  . propranolol (INDERAL) 40 MG tablet Take 1 tablet (40 mg total) by mouth 2 (two) times daily. 60 tablet 3   No current  facility-administered medications on file prior to visit.    BP 127/74   Pulse 69   Temp 98.4 F (36.9 C)   Resp 20   Ht 5\' 8"  (1.727 m)   Wt 227 lb 9.6 oz (103.2 kg)   SpO2 98%   BMI 34.61 kg/m      Objective:   Physical Exam  General Mental Status- Alert. General Appearance- Not in acute distress.   Skin General: Color- Normal Color. Moisture- Normal Moisture.  Neck Carotid Arteries- Normal color. Moisture- Normal Moisture. No carotid bruits. No JVD.  Chest and Lung Exam Auscultation: Breath Sounds:-Normal.  Cardiovascular Auscultation:Rythm- Regular. Murmurs & Other Heart Sounds:Auscultation of the heart reveals- No Murmurs.  Neurologic Cranial Nerve exam:- CN III-XII intact(No nystagmus), symmetric smile. Strength:- 5/5 equal and symmetric strength both upper and lower extremities.      Assessment & Plan:  Your bp is mostly well controlled. Would could continue propanolol for tremors and will help control bp. If bp increases can add different type med.  For high cholesterol will repeat lipid panel.   Getting vit D level for low vit d hx and osteopenia.  For anxiety refilled xanax.   Vaccine counseling on covid.  Follow up 3 months or as needed    [employer]

## 2020-10-01 ENCOUNTER — Other Ambulatory Visit: Payer: Medicare Other

## 2020-10-01 ENCOUNTER — Ambulatory Visit: Payer: Medicare Other | Admitting: Medical

## 2020-10-01 ENCOUNTER — Encounter: Payer: Self-pay | Admitting: Medical

## 2020-10-15 ENCOUNTER — Encounter: Payer: Self-pay | Admitting: Medical

## 2020-10-17 ENCOUNTER — Telehealth: Payer: Self-pay | Admitting: Medical

## 2020-10-17 MED ORDER — ALPRAZOLAM 0.5 MG PO TABS: 0.5000 mg | ORAL_TABLET | Freq: Two times a day (BID) | ORAL | 0 refills | Status: DC | PRN

## 2020-10-17 NOTE — Telephone Encounter (Signed)
Rx xanax sent to pt pharmacy.

## 2020-11-22 ENCOUNTER — Other Ambulatory Visit: Payer: Self-pay | Admitting: Medical

## 2020-11-22 NOTE — Telephone Encounter (Signed)
Last vit d checked 09/30/20 and was low at 22.92.  You started her on weekly vit d for eight weeks and pt was suppose to recheck in 3 months.  Her next visit is 12/20/20 (which will be her 3 month follow up)

## 2020-11-23 NOTE — Telephone Encounter (Signed)
Patient notified

## 2020-12-14 ENCOUNTER — Other Ambulatory Visit: Payer: Self-pay | Admitting: Medical

## 2020-12-20 ENCOUNTER — Ambulatory Visit: Payer: Medicare Other | Admitting: Medical

## 2020-12-21 ENCOUNTER — Encounter: Payer: Self-pay | Admitting: Medical

## 2020-12-21 ENCOUNTER — Ambulatory Visit (INDEPENDENT_AMBULATORY_CARE_PROVIDER_SITE_OTHER): Payer: Medicare Other | Admitting: Medical

## 2020-12-21 ENCOUNTER — Other Ambulatory Visit: Payer: Self-pay

## 2020-12-21 VITALS — BP 123/78 | HR 94 | Temp 98.1°F | Resp 18 | Ht 68.0 in | Wt 232.4 lb

## 2020-12-21 DIAGNOSIS — E559 Vitamin D deficiency, unspecified: Secondary | ICD-10-CM | POA: Diagnosis not present

## 2020-12-21 DIAGNOSIS — R739 Hyperglycemia, unspecified: Secondary | ICD-10-CM | POA: Diagnosis not present

## 2020-12-21 DIAGNOSIS — I10 Essential (primary) hypertension: Secondary | ICD-10-CM | POA: Diagnosis not present

## 2020-12-21 DIAGNOSIS — E785 Hyperlipidemia, unspecified: Secondary | ICD-10-CM

## 2020-12-21 LAB — LIPID PANEL
Cholesterol: 205 mg/dL — ABNORMAL HIGH (ref 0–200)
HDL: 41.3 mg/dL (ref >39.00)
NonHDL: 163.62
Total CHOL/HDL Ratio: 5
Triglycerides: 233 mg/dL — ABNORMAL HIGH (ref 0.0–149.0)
VLDL: 46.6 mg/dL — ABNORMAL HIGH (ref 0.0–40.0)

## 2020-12-21 LAB — COMPREHENSIVE METABOLIC PANEL
ALT: 17 U/L (ref 0–35)
AST: 17 U/L (ref 0–37)
Albumin: 4 g/dL (ref 3.5–5.2)
Alkaline Phosphatase: 73 U/L (ref 39–117)
BUN: 22 mg/dL (ref 6–23)
CO2: 27 mEq/L (ref 19–32)
Calcium: 9.4 mg/dL (ref 8.4–10.5)
Chloride: 107 mEq/L (ref 96–112)
Creatinine, Ser: 0.88 mg/dL (ref 0.40–1.20)
GFR: 63.15 mL/min (ref >60.00)
Glucose, Bld: 110 mg/dL — ABNORMAL HIGH (ref 70–99)
Potassium: 4.4 mEq/L (ref 3.5–5.1)
Sodium: 143 mEq/L (ref 135–145)
Total Bilirubin: 0.4 mg/dL (ref 0.2–1.2)
Total Protein: 6.6 g/dL (ref 6.0–8.3)

## 2020-12-21 LAB — HEMOGLOBIN A1C: Hgb A1c MFr Bld: 5.9 % (ref 4.6–6.5)

## 2020-12-21 LAB — LDL CHOLESTEROL, DIRECT: Direct LDL: 129 mg/dL

## 2020-12-21 NOTE — Progress Notes (Signed)
Subjective:    Patient ID: [name] Brown, female    DOB: 30-Aug-1940, 80 y.o.   MRN: [id number]  HPI   Pt bp levels are about 130/70 on average at home. Majority of the time 80-90% well controlled. Few above 140/90. No cardiac or neurologic signs or symptoms.  Pt has high cholesterol hx. Pt is not fasting. Pt is on fish oil. She has not started atorvastatin.  Pt has hx of tremors and is on propanolol.  Low vit d hx and I rx'd vit D.  Hx of depression and on remeron.   Anxiety and insomnia. She is on xanax. Up to date on uds and contract. On xanax for 30 years. Former pcp rx'd.      Review of Systems  Constitutional:  Negative for chills, fatigue and fever.  HENT:  Negative for congestion.   Respiratory:  Negative for cough, chest tightness, shortness of breath and wheezing.   Cardiovascular:  Negative for chest pain and palpitations.  Gastrointestinal:  Negative for abdominal pain.  Musculoskeletal:  Negative for back pain.  Neurological:  Negative for dizziness, seizures and light-headedness.  Psychiatric/Behavioral:  Positive for dysphoric mood and sleep disturbance. Negative for suicidal ideas. The patient is nervous/anxious.        Improved with meds.   Past Medical History:  Diagnosis Date   Anxiety    Depression    Essential tremor    Hyperlipidemia      Social History   Socioeconomic History   Marital status: Widowed    Spouse name: Not on file   Number of children: 2   Years of education: college   Highest education level: Not on file  Occupational History   Occupation: Retired Therapist, sports  Tobacco Use   Smoking status: Never   Smokeless tobacco: Never  Vaping Use   Vaping Use: Never used  Substance and Sexual Activity   Alcohol use: Yes    Comment: occasional glass of wine   Drug use: No   Sexual activity: Never  Other Topics Concern   Not on file  Social History Narrative   Lives alone.   Right-handed.   Two children - 1 biological, 1 adopted.    Occasional use of caffeine.   Social Determinants of Health   Financial Resource Strain: Not on file  Food Insecurity: Not on file  Transportation Needs: Not on file  Physical Activity: Not on file  Stress: Not on file  Social Connections: Not on file  Intimate Partner Violence: Not on file    Past Surgical History:  Procedure Laterality Date   ABDOMINAL HYSTERECTOMY     APPENDECTOMY     breast biopsies Bilateral    CYST EXCISION N/A 07/10/2016   Procedure: [place];  Surgeon: Aviva Signs, MD;  Location: [facility];  Service: General;  Laterality: N/A;   FL INJ LEFT KNEE CT ARTHROGRAM ([place] HX)     TONSILLECTOMY      Family History  Problem Relation Age of Onset   Atrial fibrillation Mother    COPD Mother    Atrial fibrillation Father    Lung cancer Father    Atrial fibrillation Sister     Allergies  Allergen Reactions   Penicillins Anaphylaxis    Has patient had a PCN reaction causing immediate rash, facial/tongue/throat swelling, SOB or lightheadedness with hypotension: Yes Has patient had a PCN reaction causing severe rash involving mucus membranes or skin necrosis: No Has patient had a PCN reaction that  required hospitalization Yes Has patient had a PCN reaction occurring within the last 10 years: Yes If all of the above answers are "NO", then may proceed with Cephalosporin use.    Cephalosporins Other (See Comments)    Due to allergic reaction to penicillin   Prochlorperazine Edisylate     REACTION: draws mouth to side    Current Outpatient Medications on File Prior to Visit  Medication Sig Dispense Refill   ALPRAZolam (XANAX) 0.5 MG tablet Take 1 tablet (0.5 mg total) by mouth 2 (two) times daily as needed for anxiety. 60 tablet 0   aspirin 81 MG tablet Take 81 mg by mouth daily.     atorvastatin (LIPITOR) 10 MG tablet Take 1 tablet (10 mg total) by mouth daily. 30 tablet 3   ibuprofen (ADVIL,MOTRIN) 200 MG tablet Take 800 mg by mouth at bedtime  as needed for mild pain or moderate pain.     mirtazapine (REMERON) 30 MG tablet TAKE 1 TABLET(30 MG) BY MOUTH AT BEDTIME 30 tablet 2   Omega-3 Fatty Acids (FISH OIL) 1000 MG CAPS Take by mouth.     propranolol (INDERAL) 40 MG tablet Take 1 tablet (40 mg total) by mouth 2 (two) times daily. 60 tablet 3   Vitamin D, Ergocalciferol, (DRISDOL) 1.25 MG (50000 UNIT) CAPS capsule Take 1 capsule (50,000 Units total) by mouth every 7 (seven) days. 8 capsule 0   No current facility-administered medications on file prior to visit.    BP (!) 159/100   Pulse 94   Temp 98.1 F (36.7 C)   Resp 18   Ht 5\' 8"  (1.727 m)   Wt 232 lb 6.4 oz (105.4 kg)   SpO2 94%   BMI 35.34 kg/m        Objective:   Physical Exam  General Mental Status- Alert. General Appearance- Not in acute distress.   Skin General: Color- Normal Color. Moisture- Normal Moisture.  Neck Carotid Arteries- Normal color. Moisture- Normal Moisture. No carotid bruits. No JVD.  Chest and Lung Exam Auscultation: Breath Sounds:-Normal.  Cardiovascular Auscultation:Rythm- Regular. Murmurs & Other Heart Sounds:Auscultation of the heart reveals- No Murmurs.  Abdomen Inspection:-Inspeection Normal. Palpation/Percussion:Note:No mass. Palpation and Percussion of the abdomen reveal- Non Tender, Non Distended + BS, no rebound or guarding.    Neurologic Cranial Nerve exam:- CN III-XII intact(No nystagmus), symmetric smile. Strength:- 5/5 equal and symmetric strength both upper and lower extremities.       Assessment & Plan:   Your bp is mostly well controlled. Would could continue propanolol for tremors and will help control bp. If bp increases can add different type med.   For high cholesterol will repeat lipid panel.   Getting vit D level for low vit d hx and osteopenia.  For elevated blood sugar will get a1c.    For anxiety refilled xanax.   For insomnia and depression continue remeron.  Get cmp, lipid panel and  a1c today.  Follow up in 3 month or as needed.  Mackie Pai, PA-C

## 2020-12-21 NOTE — Patient Instructions (Addendum)
Your bp is mostly well controlled. Would could continue propanolol for tremors and will help control bp. If bp increases can add different type med.   For high cholesterol will repeat lipid panel.   Getting vit D level for low vit d hx and osteopenia.  For elevated blood sugar will get a1c.    For anxiety refilled xanax.(Up to date on controlled med contract and uds)   For insomnia and depression continue remeron.   Get cmp,  vit d, lipid panel and a1c today.  Follow up in 3 month or as needed.

## 2020-12-24 LAB — VITAMIN D 1,25 DIHYDROXY
Vitamin D 1, 25 (OH)2 Total: 33 pg/mL (ref 18–72)
Vitamin D2 1, 25 (OH)2: <8 pg/mL
Vitamin D3 1, 25 (OH)2: 33 pg/mL

## 2020-12-30 ENCOUNTER — Ambulatory Visit: Payer: Medicare Other | Admitting: Medical

## 2020-12-31 ENCOUNTER — Emergency Department (HOSPITAL_BASED_OUTPATIENT_CLINIC_OR_DEPARTMENT_OTHER): Payer: Medicare Other

## 2020-12-31 ENCOUNTER — Emergency Department (HOSPITAL_BASED_OUTPATIENT_CLINIC_OR_DEPARTMENT_OTHER)
Admission: EM | Admit: 2020-12-31 | Discharge: 2020-12-31 | Disposition: A | Discharge status: Home / Self Care | Payer: Medicare Other | Attending: Emergency Medicine | Admitting: Emergency Medicine

## 2020-12-31 ENCOUNTER — Other Ambulatory Visit: Payer: Self-pay

## 2020-12-31 ENCOUNTER — Encounter (HOSPITAL_BASED_OUTPATIENT_CLINIC_OR_DEPARTMENT_OTHER): Payer: Self-pay | Admitting: Emergency Medicine

## 2020-12-31 DIAGNOSIS — Z79899 Other long term (current) drug therapy: Secondary | ICD-10-CM | POA: Diagnosis not present

## 2020-12-31 DIAGNOSIS — R1031 Right lower quadrant pain: Secondary | ICD-10-CM | POA: Insufficient documentation

## 2020-12-31 DIAGNOSIS — R000 Tachycardia, unspecified: Secondary | ICD-10-CM | POA: Insufficient documentation

## 2020-12-31 DIAGNOSIS — E039 Hypothyroidism, unspecified: Secondary | ICD-10-CM | POA: Diagnosis not present

## 2020-12-31 DIAGNOSIS — Z7982 Long term (current) use of aspirin: Secondary | ICD-10-CM | POA: Diagnosis not present

## 2020-12-31 DIAGNOSIS — R1030 Lower abdominal pain, unspecified: Secondary | ICD-10-CM

## 2020-12-31 DIAGNOSIS — R109 Unspecified abdominal pain: Secondary | ICD-10-CM | POA: Diagnosis not present

## 2020-12-31 DIAGNOSIS — I4891 Unspecified atrial fibrillation: Secondary | ICD-10-CM

## 2020-12-31 DIAGNOSIS — K219 Gastro-esophageal reflux disease without esophagitis: Secondary | ICD-10-CM | POA: Insufficient documentation

## 2020-12-31 DIAGNOSIS — N201 Calculus of ureter: Secondary | ICD-10-CM

## 2020-12-31 DIAGNOSIS — R1032 Left lower quadrant pain: Secondary | ICD-10-CM | POA: Diagnosis not present

## 2020-12-31 LAB — COMPREHENSIVE METABOLIC PANEL
ALT: 19 U/L (ref 0–44)
AST: 24 U/L (ref 15–41)
Albumin: 3.8 g/dL (ref 3.5–5.0)
Alkaline Phosphatase: 75 U/L (ref 38–126)
Anion gap: 9 (ref 5–15)
BUN: 26 mg/dL — ABNORMAL HIGH (ref 8–23)
CO2: 26 mmol/L (ref 22–32)
Calcium: 9.3 mg/dL (ref 8.9–10.3)
Chloride: 105 mmol/L (ref 98–111)
Creatinine, Ser: 1.2 mg/dL — ABNORMAL HIGH (ref 0.44–1.00)
GFR, Estimated: 47 mL/min — ABNORMAL LOW (ref >60)
Glucose, Bld: 115 mg/dL — ABNORMAL HIGH (ref 70–99)
Potassium: 4.3 mmol/L (ref 3.5–5.1)
Sodium: 140 mmol/L (ref 135–145)
Total Bilirubin: 0.7 mg/dL (ref 0.3–1.2)
Total Protein: 7.6 g/dL (ref 6.5–8.1)

## 2020-12-31 LAB — URINALYSIS, ROUTINE W REFLEX MICROSCOPIC
Bilirubin Urine: NEGATIVE
Glucose, UA: NEGATIVE mg/dL
Ketones, ur: NEGATIVE mg/dL
Nitrite: NEGATIVE
Protein, ur: NEGATIVE mg/dL
Specific Gravity, Urine: 1.025 (ref 1.005–1.030)
pH: 5.5 (ref 5.0–8.0)

## 2020-12-31 LAB — CBC WITH DIFFERENTIAL/PLATELET
Abs Immature Granulocytes: 0.03 10*3/uL (ref 0.00–0.07)
Basophils Absolute: 0.1 10*3/uL (ref 0.0–0.1)
Basophils Relative: 1 %
Eosinophils Absolute: 0.1 10*3/uL (ref 0.0–0.5)
Eosinophils Relative: 1 %
HCT: 44.6 % (ref 36.0–46.0)
Hemoglobin: 15 g/dL (ref 12.0–15.0)
Immature Granulocytes: 0 %
Lymphocytes Relative: 14 %
Lymphs Abs: 1.4 10*3/uL (ref 0.7–4.0)
MCH: 30.1 pg (ref 26.0–34.0)
MCHC: 33.6 g/dL (ref 30.0–36.0)
MCV: 89.6 fL (ref 80.0–100.0)
Monocytes Absolute: 0.7 10*3/uL (ref 0.1–1.0)
Monocytes Relative: 7 %
Neutro Abs: 7.6 10*3/uL (ref 1.7–7.7)
Neutrophils Relative %: 77 %
Platelets: 231 10*3/uL (ref 150–400)
RBC: 4.98 MIL/uL (ref 3.87–5.11)
RDW: 13.4 % (ref 11.5–15.5)
WBC: 9.9 10*3/uL (ref 4.0–10.5)
nRBC: 0 % (ref 0.0–0.2)

## 2020-12-31 LAB — URINALYSIS, MICROSCOPIC (REFLEX)

## 2020-12-31 LAB — LIPASE, BLOOD: Lipase: 33 U/L (ref 11–51)

## 2020-12-31 MED ORDER — IOHEXOL 300 MG/ML  SOLN
100.0000 mL | Freq: Once | INTRAMUSCULAR | Status: AC | PRN
  Administered 2020-12-31: 100 mL via INTRAVENOUS

## 2020-12-31 MED ORDER — SODIUM CHLORIDE 0.9 % IV BOLUS
500.0000 mL | Freq: Once | INTRAVENOUS | Status: AC
  Administered 2020-12-31: 500 mL via INTRAVENOUS

## 2020-12-31 MED ORDER — MORPHINE SULFATE (PF) 4 MG/ML IV SOLN
4.0000 mg | Freq: Once | INTRAVENOUS | Status: AC
  Administered 2020-12-31: 4 mg via INTRAVENOUS
  Filled 2020-12-31: qty 1

## 2020-12-31 MED ORDER — APIXABAN 5 MG PO TABS: 5.0000 mg | ORAL_TABLET | Freq: Two times a day (BID) | ORAL | 0 refills | Status: DC

## 2020-12-31 MED ORDER — ONDANSETRON HCL 4 MG/2ML IJ SOLN
4.0000 mg | Freq: Once | INTRAMUSCULAR | Status: AC
  Administered 2020-12-31: 4 mg via INTRAVENOUS
  Filled 2020-12-31: qty 2

## 2020-12-31 MED ORDER — HYDROCODONE-ACETAMINOPHEN 5-325 MG PO TABS: 1.0000 | ORAL_TABLET | Freq: Four times a day (QID) | ORAL | 0 refills | Status: DC | PRN

## 2020-12-31 MED ORDER — KETOROLAC TROMETHAMINE 15 MG/ML IJ SOLN
15.0000 mg | Freq: Once | INTRAMUSCULAR | Status: AC
  Administered 2020-12-31: 15 mg via INTRAVENOUS
  Filled 2020-12-31: qty 1

## 2020-12-31 MED ORDER — TAMSULOSIN HCL 0.4 MG PO CAPS: 0.4000 mg | ORAL_CAPSULE | Freq: Every day | ORAL | 0 refills | Status: DC

## 2020-12-31 NOTE — ED Provider Notes (Signed)
Patient is a 80 year old female whose care was transferred to me by Carole Civil.  In summary, patient presents to the emergency department due to abdominal pain.  She had tenderness to the right and left lower quadrants without peritoneal signs.  At shift change patient had been given analgesics, antiemetics, IVF, and CT scan of her abdomen and pelvis was pending.  Physical Exam  BP (!) 158/85   Pulse (!) 115   Temp 97.9 F (36.6 C) (Oral)   Resp 18   Ht '5\' 8"'$  (1.727 m)   Wt 105.2 kg   SpO2 96%   BMI 35.28 kg/m   Physical Exam Vitals and nursing note reviewed.  Constitutional:      General: She is not in acute distress.    Appearance: Normal appearance. She is not ill-appearing, toxic-appearing or diaphoretic.  HENT:     Head: Normocephalic and atraumatic.     Right Ear: External ear normal.     Left Ear: External ear normal.     Nose: Nose normal.     Mouth/Throat:     Mouth: Mucous membranes are moist.     Pharynx: Oropharynx is clear. No oropharyngeal exudate or posterior oropharyngeal erythema.  Eyes:     Extraocular Movements: Extraocular movements intact.  Cardiovascular:     Rate and Rhythm: Normal rate. Rhythm irregular.     Pulses: Normal pulses.     Heart sounds: Normal heart sounds. No murmur heard.   No friction rub. No gallop.  Pulmonary:     Effort: Pulmonary effort is normal. No respiratory distress.     Breath sounds: Normal breath sounds. No stridor. No wheezing, rhonchi or rales.  Abdominal:     General: Abdomen is flat.     Palpations: Abdomen is soft.     Tenderness: There is abdominal tenderness.  Musculoskeletal:        General: Normal range of motion.     Cervical back: Normal range of motion and neck supple. No tenderness.  Skin:    General: Skin is warm and dry.  Neurological:     General: No focal deficit present.     Mental Status: She is alert and oriented to person, place, and time.  Psychiatric:        Mood and Affect: Mood normal.         Behavior: Behavior normal.    ED Course/Procedures     Procedures  MDM  CT scan of the abdomen and pelvis with contrast with findings as noted below:   IMPRESSION:  1. A 3 mm right UVJ stone with mild right hydronephrosis.  Correlation with urinalysis recommended to exclude superimposed UTI.  2. Fatty liver.  3. Sigmoid diverticulosis. No bowel obstruction.  4. Aortic Atherosclerosis (ICD10-I70.0).   Lab work showing CBC without abnormalities.  Lipase within normal limits.  CMP with a glucose of 115, BUN of 26, creatinine 1.2, GFR of 47.  Likely mild AKI.  UA with trace hemoglobin, trace leukocytes, few bacteria.  Patient denying any urinary symptoms.  No difficulty urinating, dysuria, or frequency.  Doubt concomitant superimposed UTI.  Patient given morphine as well as Toradol for pain.  She notes significant resolution of her pain.  Patient initially hypertensive but this improved significantly after improvement of patient's pain.  She was found to be in atrial fibrillation at this visit.  ECG was repeated confirming atrial fibrillation.  She denies any history of atrial fibrillation.  This was discussed with my attending physician and we  both feel that patient will require anticoagulation for this.  She denies any chest pain or shortness of breath.  She was started on Eliquis.  Will discharge patient with Flomax as well as Vicodin for her pain.  We discussed dosing and safety regarding these medications.  She was given a strainer.  Patient given a referral to urology, cardiology, as well as atrial fibrillation clinic.  Feel the patient is stable for discharge at this time and she is agreeable.  Given strict return precautions.  Her questions were answered and she was amicable at the time of discharge.          Rayna Sexton, PA-C 01/01/21 0006    Drenda Freeze, MD 01/03/21 [phone]

## 2020-12-31 NOTE — ED Triage Notes (Signed)
Reports sudden onset of right flank pain that started at 10 am with n/v.  Also reports frequent uti's.

## 2020-12-31 NOTE — Discharge Instructions (Addendum)
I am prescribing 3 medications.  The first medication is called Vicodin.  This is a strong narcotic.  I would recommend taking ibuprofen for management of your pain throughout the day.  STOP TAKING IBUPROFEN ONCE YOU BEGIN TAKING YOUR ELIQUIS. Only take Vicodin for breakthrough pain you cannot control with ibuprofen.  Please only take this as prescribed.  This medication can be constipating.  Do not drive a car after taking this medication.  Do not mix with alcohol.  I am also prescribing Flomax.  You can take this once daily in the morning.  This will increase your urinary frequency and hopefully help you pass the kidney stone faster.  The last medication is called Eliquis.  This is an anticoagulant that you need to start taking for your atrial fibrillation.  Please take this as prescribed.  Please follow-up with your regular doctor regarding this to discuss further.  Below is the contact information for cardiology, the atrial fibrillation clinic, as well as the urologist.  Please follow-up with them as needed.  Please come back to the emergency department if you develop any new or worsening symptoms.  It was a pleasure to meet you.

## 2020-12-31 NOTE — ED Provider Notes (Addendum)
[facility] EMERGENCY DEPARTMENT Provider Note   CSN: [id number] Arrival date & time: 12/31/20  1445     History Chief Complaint  Patient presents with   Flank Pain    [name] Brown is a 80 y.o. female.  [name] Brown is a 80 y.o. female with essential tremor, hyperlipidemia, anxiety and depression, who presents to the ED for evaluation of abdominal pain.  Patient reports she started experiencing right lower quadrant pain this morning that has been severe and persistent throughout the day.  She reports sometimes this radiates back to her back and right flank but is more persistent in her lower abdomen.  She reports last night she had similar mild pain in the left lower quadrant but this had resolved so she did not think much of it.  She has been nauseated today and has not had much to eat or drink but has not had any vomiting.  Reports normal bowel movements, no blood in the stool.  Denies any dysuria, urinary frequency or hematuria.  She has had a previous abdominal hysterectomy and an appendectomy, no other abdominal surgeries.  No meds prior to arrival.  No other aggravating or alleviating factors.  The history is provided by the patient.      Past Medical History:  Diagnosis Date   Anxiety    Depression    Essential tremor    Hyperlipidemia     Patient Active Problem List   Diagnosis Date Noted   Tremor 08/09/2017   Transient global amnesia 08/09/2017   Memory loss 08/09/2017   LOM 06/27/2010   ACUTE BRONCHITIS 06/27/2010   UTI 04/28/2009   DM 04/23/2009   GERD 04/23/2009   RENAL INSUFFICIENCY 04/23/2009   DYSPNEA 04/23/2009   Transient cerebral ischemia 07/21/2008   RESTING TREMOR 07/21/2008   INSOMNIA, CHRONIC 07/08/2007   Actinic keratosis 01/15/2007   OSTEOPENIA 10/25/2006   HYPOTHYROIDISM, UNSPECIFIED 03/20/2006   HYPERLIPIDEMIA 03/20/2006   OBESITY, NOS 03/20/2006   Major depressive disorder, recurrent episode ([place]) 03/20/2006   Anxiety state  03/20/2006    Past Surgical History:  Procedure Laterality Date   ABDOMINAL HYSTERECTOMY     APPENDECTOMY     breast biopsies Bilateral    CYST EXCISION N/A 07/10/2016   Procedure: 3CM CYST EXCISION TRUNK;  Surgeon: Aviva Signs, MD;  Location: [facility];  Service: General;  Laterality: N/A;   FL INJ LEFT KNEE CT ARTHROGRAM ([facility] HX)     TONSILLECTOMY       OB History     Gravida      Para      Term      Preterm      AB      Living  2      SAB      IAB      Ectopic      Multiple      Live Births              Family History  Problem Relation Age of Onset   Atrial fibrillation Mother    COPD Mother    Atrial fibrillation Father    Lung cancer Father    Atrial fibrillation Sister     Social History   Tobacco Use   Smoking status: Never   Smokeless tobacco: Never  Vaping Use   Vaping Use: Never used  Substance Use Topics   Alcohol use: Yes    Comment: occasional glass of wine   Drug use: No  Home Medications Prior to Admission medications   Medication Sig Start Date End Date Taking? Authorizing Provider  ALPRAZolam Duanne Moron) 0.5 MG tablet Take 1 tablet (0.5 mg total) by mouth 2 (two) times daily as needed for anxiety. 10/17/20   [name], [name], PA-C  aspirin 81 MG tablet Take 81 mg by mouth daily.    [provider]  atorvastatin (LIPITOR) 10 MG tablet Take 1 tablet (10 mg total) by mouth daily. 09/30/20   [name], [name], PA-C  ibuprofen (ADVIL,MOTRIN) 200 MG tablet Take 800 mg by mouth at bedtime as needed for mild pain or moderate pain.    [provider]  mirtazapine (REMERON) 30 MG tablet TAKE 1 TABLET(30 MG) BY MOUTH AT BEDTIME 12/14/20   [name], [name], PA-C  Omega-3 Fatty Acids (FISH OIL) 1000 MG CAPS Take by mouth.    [provider]  propranolol (INDERAL) 40 MG tablet Take 1 tablet (40 mg total) by mouth 2 (two) times daily. 08/20/20   [name], [name], PA-C  Vitamin D, Ergocalciferol, (DRISDOL) 1.25 MG (50000 UNIT)  CAPS capsule Take 1 capsule (50,000 Units total) by mouth every 7 (seven) days. 09/30/20   [name], [name], PA-C    Allergies    Penicillins, Cephalosporins, and Prochlorperazine edisylate  Review of Systems   Review of Systems  Constitutional:  Negative for chills and fever.  HENT: Negative.    Respiratory:  Negative for cough and shortness of breath.   Cardiovascular:  Negative for chest pain.  Gastrointestinal:  Positive for abdominal pain and nausea. Negative for blood in stool, constipation, diarrhea and vomiting.  Genitourinary:  Negative for dysuria, flank pain, frequency, hematuria, vaginal bleeding and vaginal discharge.  Musculoskeletal:  Positive for back pain. Negative for arthralgias and myalgias.  Skin:  Negative for color change and rash.  Neurological:  Negative for dizziness, syncope and light-headedness.  All other systems reviewed and are negative.  Physical Exam Updated Vital Signs BP (!) 146/90   Pulse 75   Temp 97.9 F (36.6 C) (Oral)   Resp 18   Ht '5\' 8"'$  (1.727 m)   Wt 105.2 kg   SpO2 92%   BMI 35.28 kg/m   Physical Exam Vitals and nursing note reviewed.  Constitutional:      General: She is not in acute distress.    Appearance: Normal appearance. She is well-developed. She is obese. She is not ill-appearing or diaphoretic.  HENT:     Head: Normocephalic and atraumatic.     Mouth/Throat:     Mouth: Mucous membranes are moist.     Pharynx: Oropharynx is clear.  Eyes:     General:        Right eye: No discharge.        Left eye: No discharge.  Cardiovascular:     Rate and Rhythm: Regular rhythm. Tachycardia present.     Pulses: Normal pulses.     Heart sounds: Normal heart sounds. No murmur heard.   No friction rub. No gallop.  Pulmonary:     Effort: Pulmonary effort is normal. No respiratory distress.     Breath sounds: Normal breath sounds. No wheezing or rales.     Comments: Respirations equal and unlabored, patient able to speak in full  sentences, lungs clear to auscultation bilaterally  Abdominal:     General: Bowel sounds are normal.     Palpations: Abdomen is soft. There is no mass.     Tenderness: There is abdominal tenderness. There is no guarding.  Comments: Abdomen is soft, mildly distended, bowel sounds present throughout, there is tenderness in the right and left lower quadrants without guarding or peritoneal signs.  No CVA tenderness bilaterally.  Musculoskeletal:        General: No deformity.     Cervical back: Neck supple.     Comments: Reports pain across the low back, not reproducible with palpation exam.  Skin:    General: Skin is warm and dry.     Capillary Refill: Capillary refill takes less than 2 seconds.  Neurological:     Mental Status: She is alert and oriented to person, place, and time.     Coordination: Coordination normal.     Comments: Speech is clear, able to follow commands Moves extremities without ataxia, coordination intact  Psychiatric:        Mood and Affect: Mood normal.        Behavior: Behavior normal.    ED Results / Procedures / Treatments   Labs (all labs ordered are listed, but only abnormal results are displayed) Labs Reviewed  URINALYSIS, ROUTINE W REFLEX MICROSCOPIC - Abnormal; Notable for the following components:      Result Value   Hgb urine dipstick TRACE (*)    Leukocytes,Ua TRACE (*)    All other components within normal limits  URINALYSIS, MICROSCOPIC (REFLEX) - Abnormal; Notable for the following components:   Bacteria, UA FEW (*)    All other components within normal limits  COMPREHENSIVE METABOLIC PANEL - Abnormal; Notable for the following components:   Glucose, Bld 115 (*)    BUN 26 (*)    Creatinine, Ser 1.20 (*)    GFR, Estimated 47 (*)    All other components within normal limits  CBC WITH DIFFERENTIAL/PLATELET  LIPASE, BLOOD    EKG None  Radiology No results found.  Procedures Procedures   Medications Ordered in ED Medications   sodium chloride 0.9 % bolus 500 mL (has no administration in time range)  sodium chloride 0.9 % bolus 500 mL (0 mLs Intravenous Stopped 12/31/20 1930)  ondansetron (ZOFRAN) injection 4 mg (4 mg Intravenous Given 12/31/20 1828)  morphine 4 MG/ML injection 4 mg (4 mg Intravenous Given 12/31/20 1828)  iohexol (OMNIPAQUE) 300 MG/ML solution 100 mL (100 mLs Intravenous Contrast Given 12/31/20 1943)    ED Course  I have reviewed the triage vital signs and the nursing notes.  Pertinent labs & imaging results that were available during my care of the patient were reviewed by me and considered in my medical decision making (see chart for details).    MDM Rules/Calculators/A&P                           Patient presents to the ED with complaints of abdominal pain. Patient nontoxic appearing, in no apparent distress, vitals significant for mild tachycardia and hypertension, patient afebrile. On exam patient tender to palpation in the right and left lower quadrants, no peritoneal signs. Will evaluate with labs and CT abdomen pelvis. Analgesics, anti-emetics, and fluids administered.   Additional history obtained:  Additional history obtained from chart review & nursing note review.   Lab Tests:  I Ordered, reviewed, and interpreted labs, which included:  CBC: No leukocytosis, normal hemoglobin CMP: No significant electrolyte derangements, glucose of 115, patient does have slight AKI, creatinine of 1.2 with elevated BUN, baseline typically around 0.8, normal LFTs Lipase: WNL UA: Trace leukocytes with few bacteria noted, no other signs of infection  Imaging Studies ordered:  I ordered imaging studies which included CT abdomen pelvis.  CT pending at shift change.  ED Course:   At shift change care signed out to PA [facility], he will follow up on imaging and disposition appropriately.   Portions of this note were generated with [employer]. Dictation errors may occur despite  best attempts at proofreading.    Final Clinical Impression(s) / ED Diagnoses Final diagnoses:  Lower abdominal pain    Rx / DC Orders ED Discharge Orders     None        Janet Berlin 12/31/20 2009    Joana Nolton [place], [place] 01/03/21 1624    Drenda Freeze, MD 01/03/21 [phone]

## 2020-12-31 NOTE — ED Notes (Signed)
EDP at bedside  

## 2021-01-03 DIAGNOSIS — N201 Calculus of ureter: Secondary | ICD-10-CM | POA: Diagnosis not present

## 2021-01-03 DIAGNOSIS — R8271 Bacteriuria: Secondary | ICD-10-CM | POA: Diagnosis not present

## 2021-01-06 ENCOUNTER — Other Ambulatory Visit: Payer: Self-pay

## 2021-01-06 ENCOUNTER — Ambulatory Visit (HOSPITAL_COMMUNITY)
Admission: RE | Admit: 2021-01-06 | Discharge: 2021-01-06 | Disposition: A | Discharge status: Home / Self Care | Payer: Medicare Other | Source: Ambulatory Visit | Attending: Physician Assistant | Admitting: Physician Assistant

## 2021-01-06 ENCOUNTER — Encounter (HOSPITAL_COMMUNITY): Payer: Self-pay | Admitting: Physician Assistant

## 2021-01-06 VITALS — BP 150/90 | HR 82 | Ht 68.0 in | Wt 234.2 lb

## 2021-01-06 DIAGNOSIS — I4819 Other persistent atrial fibrillation: Secondary | ICD-10-CM

## 2021-01-06 DIAGNOSIS — Z6835 Body mass index (BMI) 35.0-35.9, adult: Secondary | ICD-10-CM | POA: Diagnosis not present

## 2021-01-06 DIAGNOSIS — Z79899 Other long term (current) drug therapy: Secondary | ICD-10-CM | POA: Diagnosis not present

## 2021-01-06 DIAGNOSIS — Z7982 Long term (current) use of aspirin: Secondary | ICD-10-CM | POA: Insufficient documentation

## 2021-01-06 DIAGNOSIS — Z8249 Family history of ischemic heart disease and other diseases of the circulatory system: Secondary | ICD-10-CM | POA: Diagnosis not present

## 2021-01-06 DIAGNOSIS — D6869 Other thrombophilia: Secondary | ICD-10-CM | POA: Insufficient documentation

## 2021-01-06 DIAGNOSIS — E669 Obesity, unspecified: Secondary | ICD-10-CM | POA: Insufficient documentation

## 2021-01-06 HISTORY — DX: Other persistent atrial fibrillation: I48.19

## 2021-01-06 HISTORY — DX: Other thrombophilia: D68.69

## 2021-01-06 MED ORDER — APIXABAN 5 MG PO TABS
5.0000 mg | ORAL_TABLET | Freq: Two times a day (BID) | ORAL | 3 refills | Status: DC
Start: 2021-01-06 — End: 2021-05-12

## 2021-01-06 NOTE — Progress Notes (Signed)
Primary Care Physician: Mackie Pai, PA-C Primary Cardiologist: Dr Bettina Gavia (new) Primary Electrophysiologist: none Referring Physician: [facility]   [name] Brown is a 80 y.o. female with a history of essential tremor, HLD, atrial fibrillation who presents for consultation in the [facility].  The patient was initially diagnosed with atrial fibrillation 12/31/20 after presenting to the ED with symptoms of abdominal and flank pain, found to have kidney stone. Afib was found incidentally. She had no awareness of her arrhythmia. Patient was given Eliquis for a CHADS2VASC score of 3 but was instructed not to start until she had seen urology. Today, patient feels well despite being in afib. She does admit that she gets SOB when walking long distances but it is unclear if this is related to afib vs age/deconditioning. She has been cleared by urology to start anticoagulation. She denies snoring or significant alcohol use.   Today, she denies symptoms of palpitations, chest pain, shortness of breath, orthopnea, PND, lower extremity edema, dizziness, presyncope, syncope, snoring, daytime somnolence, bleeding, or neurologic sequela. The patient is tolerating medications without difficulties and is otherwise without complaint today.    Atrial Fibrillation Risk Factors:  she does not have symptoms or diagnosis of sleep apnea. she does not have a history of rheumatic fever. she does not have a history of alcohol use. The patient does have a history of early familial atrial fibrillation or other arrhythmias. Mother, father, 2 aunts, cousin all have afib.  she has a BMI of Body mass index is 35.61 kg/m.Marland Kitchen Filed Weights   01/06/21 0836  Weight: 106.2 kg    Family History  Problem Relation Age of Onset   Atrial fibrillation Mother    COPD Mother    Atrial fibrillation Father    Lung cancer Father    Atrial fibrillation Sister      Atrial Fibrillation  Management history:  Previous antiarrhythmic drugs: none Previous cardioversions: none Previous ablations: none CHADS2VASC score: 3 Anticoagulation history: Eliquis   Past Medical History:  Diagnosis Date   Anxiety    Depression    Essential tremor    Hyperlipidemia    Past Surgical History:  Procedure Laterality Date   ABDOMINAL HYSTERECTOMY     APPENDECTOMY     breast biopsies Bilateral    CYST EXCISION N/A 07/10/2016   Procedure: 3CM CYST EXCISION TRUNK;  Surgeon: Aviva Signs, MD;  Location: [facility];  Service: General;  Laterality: N/A;   FL INJ LEFT KNEE CT ARTHROGRAM ([facility] HX)     TONSILLECTOMY      Current Outpatient Medications  Medication Sig Dispense Refill   ALPRAZolam (XANAX) 0.5 MG tablet Take 1 tablet (0.5 mg total) by mouth 2 (two) times daily as needed for anxiety. 60 tablet 0   atorvastatin (LIPITOR) 10 MG tablet Take 1 tablet (10 mg total) by mouth daily. 30 tablet 3   ibuprofen (ADVIL) 200 MG tablet      mirtazapine (REMERON) 30 MG tablet TAKE 1 TABLET(30 MG) BY MOUTH AT BEDTIME 30 tablet 2   Omega-3 Fatty Acids (FISH OIL) 1000 MG CAPS Take by mouth.     propranolol (INDERAL) 40 MG tablet Take 1 tablet (40 mg total) by mouth 2 (two) times daily. 60 tablet 3   tamsulosin (FLOMAX) 0.4 MG CAPS capsule Take 1 capsule (0.4 mg total) by mouth daily after breakfast. 10 capsule 0   apixaban (ELIQUIS) 5 MG TABS tablet Take 1 tablet (5 mg total) by mouth 2 (  two) times daily. 60 tablet 3   furosemide (LASIX) 20 MG tablet Take 20 mg by mouth as needed. (Patient not taking: Reported on 01/06/2021)     No current facility-administered medications for this encounter.    Allergies  Allergen Reactions   Penicillins Anaphylaxis    Has patient had a PCN reaction causing immediate rash, facial/tongue/throat swelling, SOB or lightheadedness with hypotension: Yes Has patient had a PCN reaction causing severe rash involving mucus membranes or skin necrosis: No Has patient had  a PCN reaction that required hospitalization Yes Has patient had a PCN reaction occurring within the last 10 years: Yes If all of the above answers are "NO", then may proceed with Cephalosporin use.    Cephalosporins Other (See Comments)    Due to allergic reaction to penicillin   Prochlorperazine Edisylate     REACTION: draws mouth to side    Social History   Socioeconomic History   Marital status: Widowed    Spouse name: Not on file   Number of children: 2   Years of education: college   Highest education level: Not on file  Occupational History   Occupation: Retired Therapist, sports  Tobacco Use   Smoking status: Never   Smokeless tobacco: Never  Vaping Use   Vaping Use: Never used  Substance and Sexual Activity   Alcohol use: Yes    Alcohol/week: 1.0 standard drink    Types: 1 Glasses of wine per week    Comment: occasional glass of wine   Drug use: No   Sexual activity: Never  Other Topics Concern   Not on file  Social History Narrative   Lives alone.   Right-handed.   Two children - 1 biological, 1 adopted.   Occasional use of caffeine.   Social Determinants of Health   Financial Resource Strain: Not on file  Food Insecurity: Not on file  Transportation Needs: Not on file  Physical Activity: Not on file  Stress: Not on file  Social Connections: Not on file  Intimate Partner Violence: Not on file     ROS- All systems are reviewed and negative except as per the HPI above.  Physical Exam: Vitals:   01/06/21 0836  BP: (!) 150/90  Pulse: 82  Weight: 106.2 kg  Height: '5\' 8"'$  (1.727 m)    GEN- The patient is a well appearing obese elderlyfemale, alert and oriented x 3 today.   Head- normocephalic, atraumatic Eyes-  Sclera clear, conjunctiva pink Ears- hearing intact Oropharynx- clear Neck- supple  Lungs- Clear to ausculation bilaterally, normal work of breathing Heart- irregular rate and rhythm, no murmurs, rubs or gallops  GI- soft, NT, ND, + BS Extremities-  no clubbing, cyanosis, or edema MS- no significant deformity or atrophy Skin- no rash or lesion Psych- euthymic mood, full affect Neuro- strength and sensation are intact  Wt Readings from Last 3 Encounters:  01/06/21 106.2 kg  12/31/20 105.2 kg  12/21/20 105.4 kg    EKG today demonstrates  Afib Vent. rate 82 BPM PR interval * ms QRS duration 86 ms QT/QTcB 376/439 ms  Echo 08/22/17 demonstrated  - Left ventricle: The cavity size was normal. Wall thickness was    increased in a pattern of mild LVH. There was focal basal    hypertrophy. Systolic function was normal. The estimated ejection    fraction was in the range of 60% to 65%. Wall motion was normal;    there were no regional wall motion abnormalities. Features are  consistent with a pseudonormal left ventricular filling pattern,    with concomitant abnormal relaxation and increased filling    pressure (grade 2 diastolic dysfunction).  Epic records are reviewed at length today  CHA2DS2-VASc Score = 3  The patient's score is based upon: CHF History: No HTN History: No Diabetes History: No Stroke History: No Vascular Disease History: No Age Score: 2 Gender Score: 1     ASSESSMENT AND PLAN: 1. Persistent Atrial Fibrillation (ICD10:  I48.19) The patient's CHA2DS2-VASc score is 3, indicating a 3.2% annual risk of stroke.   General education about afib provided and questions answered. We also discussed her stroke risk and the risks and benefits of anticoagulation. Will start Eliquis 5 mg BID as there is no urologic procedure planned. Stop ASA.  Check echocardiogram If she is still out of rhythm at follow up, would consider DCCV after 3 weeks of uninterrupted anticoagulation. If she feels no different in SR and her EF is preserved, she may be a good candidate for rate control given her paucity of symptoms.   2. Secondary Hypercoagulable State (ICD10:  D68.69) The patient is at significant risk for  stroke/thromboembolism based upon her CHA2DS2-VASc Score of 3.  Start Apixaban (Eliquis).   3. Obesity Body mass index is 35.61 kg/m. Lifestyle modification was discussed at length including regular exercise and weight reduction.    Follow up with Dr Bettina Gavia as scheduled.    [facility] [address] [place], [place] [address] [phone] 01/06/2021 9:36 AM

## 2021-01-06 NOTE — Patient Instructions (Signed)
Stop aspirin  Start Eliquis 5 mg twice a day.

## 2021-01-13 ENCOUNTER — Encounter: Payer: Self-pay | Admitting: Medical

## 2021-01-13 ENCOUNTER — Telehealth: Payer: Self-pay | Admitting: Medical

## 2021-01-13 NOTE — Telephone Encounter (Signed)
Opened to load pharmacy but could not find. Will sent when pharmacy loaded. See my chart message pt sent me. Please load.

## 2021-01-14 ENCOUNTER — Telehealth: Payer: Self-pay | Admitting: Medical

## 2021-01-14 MED ORDER — ALPRAZOLAM 0.5 MG PO TABS: 0.5000 mg | ORAL_TABLET | Freq: Two times a day (BID) | ORAL | 0 refills | Status: DC | PRN

## 2021-01-14 NOTE — Telephone Encounter (Signed)
Pharmacy loaded

## 2021-01-14 NOTE — Telephone Encounter (Signed)
Rx xanax. Limited number sent to her pharmacy out of town.

## 2021-01-21 ENCOUNTER — Encounter: Payer: Self-pay | Admitting: Medical

## 2021-01-24 ENCOUNTER — Telehealth: Payer: Self-pay | Admitting: *Deleted

## 2021-01-24 ENCOUNTER — Emergency Department (HOSPITAL_BASED_OUTPATIENT_CLINIC_OR_DEPARTMENT_OTHER): Payer: Medicare Other

## 2021-01-24 ENCOUNTER — Encounter (HOSPITAL_BASED_OUTPATIENT_CLINIC_OR_DEPARTMENT_OTHER): Payer: Self-pay

## 2021-01-24 ENCOUNTER — Other Ambulatory Visit: Payer: Self-pay | Admitting: Medical

## 2021-01-24 ENCOUNTER — Other Ambulatory Visit: Payer: Self-pay

## 2021-01-24 ENCOUNTER — Emergency Department (HOSPITAL_BASED_OUTPATIENT_CLINIC_OR_DEPARTMENT_OTHER)
Admission: EM | Admit: 2021-01-24 | Discharge: 2021-01-24 | Disposition: A | Discharge status: Home / Self Care | Payer: Medicare Other | Attending: Emergency Medicine | Admitting: Emergency Medicine

## 2021-01-24 ENCOUNTER — Telehealth: Payer: Self-pay

## 2021-01-24 ENCOUNTER — Telehealth (HOSPITAL_COMMUNITY): Payer: Self-pay

## 2021-01-24 DIAGNOSIS — R251 Tremor, unspecified: Secondary | ICD-10-CM | POA: Insufficient documentation

## 2021-01-24 DIAGNOSIS — E039 Hypothyroidism, unspecified: Secondary | ICD-10-CM | POA: Diagnosis not present

## 2021-01-24 DIAGNOSIS — R410 Disorientation, unspecified: Secondary | ICD-10-CM | POA: Diagnosis not present

## 2021-01-24 DIAGNOSIS — Z79899 Other long term (current) drug therapy: Secondary | ICD-10-CM | POA: Insufficient documentation

## 2021-01-24 DIAGNOSIS — Z7901 Long term (current) use of anticoagulants: Secondary | ICD-10-CM | POA: Diagnosis not present

## 2021-01-24 DIAGNOSIS — R42 Dizziness and giddiness: Secondary | ICD-10-CM | POA: Insufficient documentation

## 2021-01-24 DIAGNOSIS — R519 Headache, unspecified: Secondary | ICD-10-CM | POA: Diagnosis not present

## 2021-01-24 HISTORY — DX: Unspecified atrial fibrillation: I48.91

## 2021-01-24 LAB — CBC WITH DIFFERENTIAL/PLATELET
Abs Immature Granulocytes: 0.01 10*3/uL (ref 0.00–0.07)
Basophils Absolute: 0.1 10*3/uL (ref 0.0–0.1)
Basophils Relative: 1 %
Eosinophils Absolute: 0.1 10*3/uL (ref 0.0–0.5)
Eosinophils Relative: 2 %
HCT: 44.6 % (ref 36.0–46.0)
Hemoglobin: 14.8 g/dL (ref 12.0–15.0)
Immature Granulocytes: 0 %
Lymphocytes Relative: 31 %
Lymphs Abs: 1.9 10*3/uL (ref 0.7–4.0)
MCH: 30 pg (ref 26.0–34.0)
MCHC: 33.2 g/dL (ref 30.0–36.0)
MCV: 90.3 fL (ref 80.0–100.0)
Monocytes Absolute: 0.4 10*3/uL (ref 0.1–1.0)
Monocytes Relative: 6 %
Neutro Abs: 3.6 10*3/uL (ref 1.7–7.7)
Neutrophils Relative %: 60 %
Platelets: 227 10*3/uL (ref 150–400)
RBC: 4.94 MIL/uL (ref 3.87–5.11)
RDW: 13.4 % (ref 11.5–15.5)
WBC: 6.1 10*3/uL (ref 4.0–10.5)
nRBC: 0 % (ref 0.0–0.2)

## 2021-01-24 LAB — COMPREHENSIVE METABOLIC PANEL
ALT: 18 U/L (ref 0–44)
AST: 19 U/L (ref 15–41)
Albumin: 4.2 g/dL (ref 3.5–5.0)
Alkaline Phosphatase: 72 U/L (ref 38–126)
Anion gap: 10 (ref 5–15)
BUN: 18 mg/dL (ref 8–23)
CO2: 25 mmol/L (ref 22–32)
Calcium: 9.5 mg/dL (ref 8.9–10.3)
Chloride: 105 mmol/L (ref 98–111)
Creatinine, Ser: 0.94 mg/dL (ref 0.44–1.00)
GFR, Estimated: >60 mL/min (ref >60)
Glucose, Bld: 108 mg/dL — ABNORMAL HIGH (ref 70–99)
Potassium: 4.2 mmol/L (ref 3.5–5.1)
Sodium: 140 mmol/L (ref 135–145)
Total Bilirubin: 0.8 mg/dL (ref 0.3–1.2)
Total Protein: 7.8 g/dL (ref 6.5–8.1)

## 2021-01-24 MED ORDER — SODIUM CHLORIDE 0.9 % IV BOLUS
500.0000 mL | Freq: Once | INTRAVENOUS | Status: AC
  Administered 2021-01-24: 500 mL via INTRAVENOUS

## 2021-01-24 NOTE — Telephone Encounter (Signed)
Patient called office , please refer to other telephone note

## 2021-01-24 NOTE — ED Notes (Signed)
Patient transported to CT 

## 2021-01-24 NOTE — Telephone Encounter (Signed)
Patient called and states she is extremely dizzy when lying down, sitting up and standing, slight headache and feels very woozy. Symptoms since Saturday. She was very confused over the weekend.Her blood pressure was 171/106 and heart rate was 98 on Saturday. This morning she checked her blood pressure- 135/92 and heart rate was 84. Consulted with Roderic Palau NP regarding patient and we are not able to see her this week for appointment.  I reached out to Dr. Joya Gaskins clinic to see if they could see her sooner than 9/1 and they are not able to accommodate appointment for sooner to be seen. Instructed patient to go to the ER to be evaluated at [facility] in [facility]. Consulted with patient and she verbalized understanding.

## 2021-01-24 NOTE — Telephone Encounter (Signed)
Who Is Calling Patient / Member / Family / Caregiver Caller Name [place] Phone Number [phone] Patient Name [name] Brown Patient DOB 03/02/1941 Call Type Message Only Information Provided Reason for Call Request to Schedule Office Appointment Initial Comment Caller states she has a problem with severe dizziness all weekend. She has been diagnosed with AFIB recently. Patient request to speak to RN No Disp. Time Disposition Final User 01/24/2021 7:38:10 AM General Information Provided Yes Artis Flock

## 2021-01-24 NOTE — Telephone Encounter (Signed)
Requesting: xanax Contract: 07/2020 UDS:07/2020 Last Visit:12/21/2020 Next Visit:03/23/2021 Last Refill:01/14/2021- 12 tablets  Please Advise

## 2021-01-24 NOTE — Telephone Encounter (Signed)
I went to call pt back but she is at ER currently

## 2021-01-24 NOTE — ED Provider Notes (Signed)
[name] EMERGENCY DEPARTMENT Provider Note   CSN: [id number] Arrival date & time: 01/24/21  1142     History Chief Complaint  Patient presents with   Dizziness    [name] Brown is a 80 y.o. female.  HPI 80 year old female presents with dizziness. Started 2 days ago in the evening.  Earlier that evening she had an episode of confusion where she was texting 2 different people and was getting them mixed up such as which kids were which.  This seemed to resolve and then she noticed the dizziness and a mild 2/10 headache.  The symptoms been continuous since.  Feels like she might pass out.  She does not feel safe to drive.  She has no trouble walking.  There is no vomiting, vision changes, chest pain, shortness of breath.  No weakness or numbness in her extremities.  Past Medical History:  Diagnosis Date   A-fib [facility])    Anxiety    Depression    Essential tremor    Hyperlipidemia     Patient Active Problem List   Diagnosis Date Noted   Persistent atrial fibrillation ([place]) 01/06/2021   Secondary hypercoagulable state ([place]) 01/06/2021   Tremor 08/09/2017   Transient global amnesia 08/09/2017   Memory loss 08/09/2017   LOM 06/27/2010   ACUTE BRONCHITIS 06/27/2010   UTI 04/28/2009   DM 04/23/2009   GERD 04/23/2009   RENAL INSUFFICIENCY 04/23/2009   DYSPNEA 04/23/2009   Transient cerebral ischemia 07/21/2008   RESTING TREMOR 07/21/2008   INSOMNIA, CHRONIC 07/08/2007   Actinic keratosis 01/15/2007   OSTEOPENIA 10/25/2006   HYPOTHYROIDISM, UNSPECIFIED 03/20/2006   HYPERLIPIDEMIA 03/20/2006   OBESITY, NOS 03/20/2006   Major depressive disorder, recurrent episode ([place]) 03/20/2006   Anxiety state 03/20/2006    Past Surgical History:  Procedure Laterality Date   ABDOMINAL HYSTERECTOMY     APPENDECTOMY     breast biopsies Bilateral    CYST EXCISION N/A 07/10/2016   Procedure: 3CM CYST EXCISION TRUNK;  Surgeon: Aviva Signs, MD;  Location: [facility];  Service:  General;  Laterality: N/A;   FL INJ LEFT KNEE CT ARTHROGRAM ([facility] HX)     TONSILLECTOMY       OB History     Gravida      Para      Term      Preterm      AB      Living  2      SAB      IAB      Ectopic      Multiple      Live Births              Family History  Problem Relation Age of Onset   Atrial fibrillation Mother    COPD Mother    Atrial fibrillation Father    Lung cancer Father    Atrial fibrillation Sister     Social History   Tobacco Use   Smoking status: Never   Smokeless tobacco: Never  Vaping Use   Vaping Use: Never used  Substance Use Topics   Alcohol use: Yes    Alcohol/week: 1.0 standard drink    Types: 1 Glasses of wine per week    Comment: occasional glass of wine   Drug use: No    Home Medications Prior to Admission medications   Medication Sig Start Date End Date Taking? Authorizing Provider  ALPRAZolam (XANAX) 0.5 MG tablet TAKE 1 TABLET(0.5 MG) BY MOUTH  TWICE DAILY AS NEEDED FOR ANXIETY 01/24/21   [name], [name], PA-C  apixaban (ELIQUIS) 5 MG TABS tablet Take 1 tablet (5 mg total) by mouth 2 (two) times daily. 01/06/21 02/05/21  Fenton, Clint R, PA  atorvastatin (LIPITOR) 10 MG tablet Take 1 tablet (10 mg total) by mouth daily. 09/30/20   [name], [name], PA-C  furosemide (LASIX) 20 MG tablet Take 20 mg by mouth as needed. Patient not taking: Reported on 01/06/2021    [provider]  ibuprofen (ADVIL) 200 MG tablet  12/31/20   [provider]  mirtazapine (REMERON) 30 MG tablet TAKE 1 TABLET(30 MG) BY MOUTH AT BEDTIME 12/14/20   [name], [name], PA-C  Omega-3 Fatty Acids (FISH OIL) 1000 MG CAPS Take by mouth.    [provider]  propranolol (INDERAL) 40 MG tablet Take 1 tablet (40 mg total) by mouth 2 (two) times daily. 08/20/20   [name], [name], PA-C  tamsulosin (FLOMAX) 0.4 MG CAPS capsule Take 1 capsule (0.4 mg total) by mouth daily after breakfast. 12/31/20   Rayna Sexton, PA-C    Allergies     Penicillins, Cephalosporins, and Prochlorperazine edisylate  Review of Systems   Review of Systems  Constitutional:  Negative for fever.  Eyes:  Negative for visual disturbance.  Respiratory:  Negative for shortness of breath.   Cardiovascular:  Negative for chest pain.  Gastrointestinal:  Negative for vomiting.  Neurological:  Positive for dizziness and headaches. Negative for weakness and numbness.  All other systems reviewed and are negative.  Physical Exam Updated Vital Signs BP 126/64   Pulse 92   Temp 98.3 F (36.8 C) (Oral)   Resp 20   SpO2 96%   Physical Exam Vitals and nursing note reviewed.  Constitutional:      General: She is not in acute distress.    Appearance: She is well-developed. She is not ill-appearing or diaphoretic.  HENT:     Head: Normocephalic and atraumatic.     Right Ear: External ear normal.     Left Ear: External ear normal.     Nose: Nose normal.  Eyes:     General:        Right eye: No discharge.        Left eye: No discharge.     Extraocular Movements: Extraocular movements intact.     Pupils: Pupils are equal, round, and reactive to light.  Cardiovascular:     Rate and Rhythm: Normal rate. Rhythm irregular.     Heart sounds: Normal heart sounds.  Pulmonary:     Effort: Pulmonary effort is normal.     Breath sounds: Normal breath sounds.  Abdominal:     Palpations: Abdomen is soft.     Tenderness: There is no abdominal tenderness.  Skin:    General: Skin is warm and dry.  Neurological:     Mental Status: She is alert.     Motor: Tremor present.     Comments: CN 3-12 grossly intact. 5/5 strength in all 4 extremities. Grossly normal sensation. Normal finger to nose. Normal gait  Psychiatric:        Mood and Affect: Mood is not anxious.    ED Results / Procedures / Treatments   Labs (all labs ordered are listed, but only abnormal results are displayed) Labs Reviewed  COMPREHENSIVE METABOLIC PANEL - Abnormal; Notable for the  following components:      Result Value   Glucose, Bld 108 (*)    All other components within normal  limits  CBC WITH DIFFERENTIAL/PLATELET    EKG EKG Interpretation  Date/Time:  Monday January 24 2021 12:05:17 EDT Ventricular Rate:  96 PR Interval:    QRS Duration: 84 QT Interval:  348 QTC Calculation: 439 R Axis:   -1 Text Interpretation: Atrial fibrillation Nonspecific ST and T wave abnormality Abnormal ECG Confirmed by Sherwood Gambler [id number]) on 01/24/2021 12:29:48 PM  Radiology DG Chest 2 View  Result Date: 01/24/2021 CLINICAL DATA:  Dizziness EXAM: CHEST - 2 VIEW COMPARISON:  May 16, 2020 FINDINGS: The heart size and mediastinal contours are within normal limits. No focal consolidation. No pleural effusion. No pneumothorax. The visualized skeletal structures are unremarkable. IMPRESSION: No active cardiopulmonary disease. Electronically Signed   By: Dahlia Bailiff M.D.   On: 01/24/2021 14:15   CT HEAD WO CONTRAST (5MM)  Result Date: 01/24/2021 CLINICAL DATA:  Dizziness and confusion EXAM: CT HEAD WITHOUT CONTRAST TECHNIQUE: Contiguous axial images were obtained from the base of the skull through the vertex without intravenous contrast. COMPARISON:  MRI August 18, 2017 FINDINGS: Brain: Scattered subcortical and periventricular white matter hypodensities, nonspecific but likely reflecting a very mild burden of chronic ischemic small vessel disease. No evidence of acute large vascular territory infarction, hemorrhage, hydrocephalus, extra-axial collection or mass lesion/mass effect. Vascular: No hyperdense vessel. Atherosclerotic calcifications of the internal carotid arteries at the skull base. Skull: Hyperostosis interna.  Negative for fracture or focal lesion. Sinuses/Orbits: Opacification of the right sphenoid sinus. The mastoid air cells are predominantly clear. Orbits are unremarkable. Other: Cerumen in the bilateral external auditory canals. IMPRESSION: 1. No acute intracranial  findings. 2. Very mild burden of chronic ischemic small vessel disease. 3. Right sphenoid sinus disease. Electronically Signed   By: Dahlia Bailiff M.D.   On: 01/24/2021 14:14    Procedures Procedures   Medications Ordered in ED Medications  sodium chloride 0.9 % bolus 500 mL (500 mLs Intravenous New Bag/Given 01/24/21 1339)    ED Course  I have reviewed the triage vital signs and the nursing notes.  Pertinent labs & imaging results that were available during my care of the patient were reviewed by me and considered in my medical decision making (see chart for details).    MDM Rules/Calculators/A&P                           Patient presents with slight headache and some mild dizziness.  It is not specifically room spinning or feeling off balance though she does feel like she might fall/pass out when she stands up and walks.  It is a continuous sensation for several days.  CT head obtained and is benign, which is reassuring given she is on Eliquis.  She has remained in A. fib since diagnosis and so I do not think this is the cause.  Labs are unremarkable.  Given small bolus of fluid.  She is feeling about the same but is able to ambulate without difficulty and feels well enough for discharge home.  No ACS symptoms.  She did note this transient confusion while texting prior to the symptoms but it is unclear if this was pathologic versus just making a mistake.  We discussed the potential for an MRI as this would fully rule out stroke, though this seems less likely.  She wants to hold off which I think is pretty reasonable and I think she is otherwise stable for discharge home to follow-up with PCP or return if symptoms worsen. Final  Clinical Impression(s) / ED Diagnoses Final diagnoses:  Dizziness    Rx / DC Orders ED Discharge Orders     None        Sherwood Gambler, MD 01/24/21 1445

## 2021-01-24 NOTE — Telephone Encounter (Signed)
Pt called in regard to her after hours call.   She stated that over the weekend she has been having dizziness, and saturdays she had an episode of confusion in which she could not recall much of anything. She has been recently Dx with Afib and does not see Card until 02/10/21.   She declines any SOB, or palpitations.  Pt aware of no appointments and advised to go to ER since her sxs have changed since July visit. Pt does not want to go. Please advise.

## 2021-01-24 NOTE — ED Triage Notes (Addendum)
Pt c/o dizziness and ~1 hour of confusion on 8/13-slight HA 2/10-states she was dx with Afib last month-has been seen at Afib clinic with plans for echo then cardioversion after echo results-NAD-steady gait

## 2021-01-24 NOTE — Discharge Instructions (Addendum)
Be sure to increase your fluids at home and do not stand up quickly when trying to get up to walk.  If you develop new or worsening headache, dizziness, vision changes, speech difficulty, weakness or numbness in your extremities, chest pain, shortness of breath, or any other new/concerning symptoms then return to the ER for evaluation.

## 2021-01-27 ENCOUNTER — Ambulatory Visit (HOSPITAL_COMMUNITY)
Admission: RE | Admit: 2021-01-27 | Discharge: 2021-01-27 | Disposition: A | Discharge status: Home / Self Care | Payer: Medicare Other | Source: Ambulatory Visit | Attending: Medical | Admitting: Medical

## 2021-01-27 ENCOUNTER — Other Ambulatory Visit: Payer: Self-pay

## 2021-01-27 DIAGNOSIS — E785 Hyperlipidemia, unspecified: Secondary | ICD-10-CM | POA: Diagnosis not present

## 2021-01-27 DIAGNOSIS — E119 Type 2 diabetes mellitus without complications: Secondary | ICD-10-CM | POA: Diagnosis not present

## 2021-01-27 DIAGNOSIS — I4819 Other persistent atrial fibrillation: Secondary | ICD-10-CM | POA: Insufficient documentation

## 2021-01-27 LAB — ECHOCARDIOGRAM COMPLETE
AR max vel: 2.61 cm2
AV Area VTI: 2.59 cm2
AV Area mean vel: 2.61 cm2
AV Mean grad: 1.5 mmHg
AV Peak grad: 2.8 mmHg
Ao pk vel: 0.83 m/s
S' Lateral: 3.2 cm

## 2021-01-31 ENCOUNTER — Ambulatory Visit: Payer: Medicare Other | Admitting: Medical

## 2021-02-01 ENCOUNTER — Other Ambulatory Visit: Payer: Self-pay | Admitting: Medical

## 2021-02-02 NOTE — Telephone Encounter (Signed)
Refilled xanax and propanolol. Have pt follow up within next 2 weeks. Recently seen in ED for dizziness.

## 2021-02-02 NOTE — Telephone Encounter (Signed)
Requesting: alprazolam 0.'5mg'$   Contract: 07/28/2020 UDS: 07/28/2020 Last Visit: 12/21/2020 Next Visit: 03/23/2021 Last Refill: 01/24/2021 #15 and 0RF Pt sig: 1 tab BID prn  Please Advise    See Pt message below: Patient Comment: I took my last xanax yesterday morning. Is there a reason I am only getting 15 and  not 30? Thank you

## 2021-02-03 DIAGNOSIS — F32A Depression, unspecified: Secondary | ICD-10-CM | POA: Insufficient documentation

## 2021-02-03 DIAGNOSIS — G250 Essential tremor: Secondary | ICD-10-CM | POA: Insufficient documentation

## 2021-02-08 ENCOUNTER — Other Ambulatory Visit: Payer: Self-pay | Admitting: Medical

## 2021-02-08 DIAGNOSIS — N201 Calculus of ureter: Secondary | ICD-10-CM | POA: Diagnosis not present

## 2021-02-08 DIAGNOSIS — N3000 Acute cystitis without hematuria: Secondary | ICD-10-CM | POA: Diagnosis not present

## 2021-02-08 NOTE — Telephone Encounter (Signed)
Requesting: alprazolam Contract: none UDS: none Last Visit:12/21/20 Next Visit: 02/10/21 Last Refill: 02/02/21  Please Advise

## 2021-02-08 NOTE — Telephone Encounter (Signed)
FYI has been updated as well.    Requesting: alprazolam Contract: 07/28/20 UDS: 07/28/20 Last Visit:12/21/20 Next Visit: 02/10/21 Last Refill: 02/02/21

## 2021-02-09 NOTE — H&P (View-Only) (Signed)
Cardiology Office Note:    Date:  02/10/2021   ID:  [name] Brown, DOB 1941-04-17, MRN [id number]  PCP:  Mackie Pai, PA-C  Cardiologist:  Shirlee More, MD   Referring MD: Mackie Pai, PA-C  ASSESSMENT:    1. Persistent atrial fibrillation ([place])   2. Secondary hypercoagulable state ([name] Brown)   3. Chronic anticoagulation    PLAN:    In order of problems listed above:  We discussed her atrial fibrillation age-indeterminate atrial fibrillation as long  as last September however it may have been paroxysmal previously only recently documented heart rate is controlled she is anticoagulation and has only mild left atrial enlargement.  We discussed whether to continue rate control and anticoagulation of either try recent rhythm sinus she prefers cardioversion does not want to take antiarrhythmic drugs worried about side effects and we decided to elective redo cardioversion is successful and address the issue of antiarrhythmic drug afterwards and if unsuccessful to remain in atrial fibrillation with rate control and she has very little symptoms. Should continue her anticoagulation Blood pressure today is out of range she typically runs 130/70 at home and including recent doctor visit and home blood pressure checks we will recheck in the office  Next appointment 6 weeks   Medication Adjustments/Labs and Tests Ordered: Current medicines are reviewed at length with the patient today.  Concerns regarding medicines are outlined above.  No orders of the defined types were placed in this encounter.  No orders of the defined types were placed in this encounter.   Recently recognized atrial fibrillation  History of Present Illness:    [name] Brown is a 80 y.o. female who is being seen today for the evaluation of atrial fibrillation at the request of [name] Brown, [place].  She was seen at [facility] ED 01/24/2021 with dizziness transient confusion near loss of consciousness  was found to be in atrial fibrillation.  EKG interpreted as atrial fibrillation nonspecific ST-T changes heart rate 96 bpm.  Laboratory studies included a normal CMP hemoglobin 14.8 chest x-ray no active disease CT of the head with contrast showed no acute intracranial findings.  An echocardiogram performed 01/27/2021 left ventricle is normal in size wall thickness EF was low normal to mildly reduced 50 to 55% with indeterminate diastolic filling pattern and reduced GLS -12%.  Right ventricle is normal in size function and normal pulmonary artery pressure left atrium is mildly dilated and there is no significant valvular abnormality.  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 This episode was prompted by an episode of confusion for a few hours and dizziness She takes a beta-blocker and has rate control of atrial propranolol for treatment. She has been taking her anticoagulant since July 28 without any bleeding medications. She decided to stop taking a statin. She has no known history of heart disease congenital rheumatic or previous atrial fibrillation She is relatively asymptomatic when she goes to the high school football games or track meets and walks a long distance she finds her self weak has to stop and rest no chest pain or shortness of breath no edema and she has not had syncope. She does not snore severely use alcohol and her TSH is normal no history of thyroid disease Past Medical History:  Diagnosis Date   A-fib ([place])  Anxiety    Depression    Essential tremor    Hyperlipidemia     Past Surgical History:  Procedure Laterality Date   ABDOMINAL HYSTERECTOMY     APPENDECTOMY     breast biopsies Bilateral    CYST EXCISION N/A 07/10/2016   Procedure: 3CM CYST EXCISION TRUNK;  Surgeon: Aviva Signs, MD;  Location: [facility]  ORS;  Service: General;  Laterality: N/A;   FL INJ LEFT KNEE CT ARTHROGRAM ([facility] HX)     TONSILLECTOMY      Current Medications: Current Meds  Medication Sig   ALPRAZolam (XANAX) 0.5 MG tablet TAKE 1 TABLET(0.5 MG) BY MOUTH TWICE DAILY AS NEEDED FOR ANXIETY   apixaban (ELIQUIS) 5 MG TABS tablet Take 1 tablet (5 mg total) by mouth 2 (two) times daily.   mirtazapine (REMERON) 30 MG tablet TAKE 1 TABLET(30 MG) BY MOUTH AT BEDTIME   Omega-3 Fatty Acids (FISH OIL) 1000 MG CAPS Take by mouth.   propranolol (INDERAL) 40 MG tablet TAKE 1 TABLET(40 MG) BY MOUTH TWICE DAILY     Allergies:   Penicillins, Cephalosporins, and Prochlorperazine edisylate   Social History   Socioeconomic History   Marital status: Widowed    Spouse name: Not on file   Number of children: 2   Years of education: college   Highest education level: Not on file  Occupational History   Occupation: Retired Therapist, sports  Tobacco Use   Smoking status: Never   Smokeless tobacco: Never  Vaping Use   Vaping Use: Never used  Substance and Sexual Activity   Alcohol use: Yes    Alcohol/week: 1.0 standard drink    Types: 1 Glasses of wine per week    Comment: occasional glass of wine   Drug use: No   Sexual activity: Never  Other Topics Concern   Not on file  Social History Narrative   Lives alone.   Right-handed.   Two children - 1 biological, 1 adopted.   Occasional use of caffeine.   Social Determinants of Health   Financial Resource Strain: Not on file  Food Insecurity: Not on file  Transportation Needs: Not on file  Physical Activity: Not on file  Stress: Not on file  Social Connections: Not on file     Family History: The patient's family history includes Atrial fibrillation in her father, mother, and sister; COPD in her mother; Lung cancer in her father.  ROS:   ROS Please see the history of present illness.     All other systems reviewed and are negative.  EKGs/Labs/Other Studies Reviewed:    The  following studies were reviewed today:   EKG:  EKG is  ordered today.  The ekg ordered today is personally reviewed and demonstrates confirms atrial fibrillation with controlled ventricular rate 75 bpm nonspecific T waves left axis deviation  Recent Labs: 01/24/2021: ALT 18; BUN 18; Creatinine, Ser 0.94; Hemoglobin 14.8; Platelets 227; Potassium 4.2; Sodium 140  Recent Lipid Panel    Component Value Date/Time   CHOL 205 (H) 12/21/2020 0913   TRIG 233.0 (H) 12/21/2020 0913   HDL 41.30 12/21/2020 0913   CHOLHDL 5 12/21/2020 0913   VLDL 46.6 (H) 12/21/2020 0913   LDLCALC 152 (H) 05/18/2010 2005   LDLDIRECT 129.0 12/21/2020 0913    Physical Exam:    VS:  BP (!) 170/110 (BP Location: Right Arm, Patient Position: Sitting)   Pulse 76   Ht '5\' 8"'$  (1.727 m)   Wt 229 lb 1.9 oz (103.9 kg)  SpO2 98%   BMI 34.84 kg/m     Wt Readings from Last 3 Encounters:  02/10/21 229 lb 1.9 oz (103.9 kg)  02/10/21 229 lb 12.8 oz (104.2 kg)  01/06/21 234 lb 3.2 oz (106.2 kg)     GEN: She has a resting tremor well nourished, well developed in no acute distress HEENT: Normal NECK: No JVD; No carotid bruits LYMPHATICS: No lymphadenopathy CARDIAC: Irregular rate and rhythm RRR, no murmurs, rubs, gallops RESPIRATORY:  Clear to auscultation without rales, wheezing or rhonchi  ABDOMEN: Soft, non-tender, non-distended MUSCULOSKELETAL:  No edema; No deformity  SKIN: Warm and dry NEUROLOGIC:  Alert and oriented x 3 PSYCHIATRIC:  Normal affect     Signed, Shirlee More, MD  02/10/2021 9:42 AM    [place]

## 2021-02-09 NOTE — Progress Notes (Signed)
Cardiology Office Note:    Date:  02/10/2021   ID:  ICHELLE Brown, DOB 10/19/1940, MRN [id number]  PCP:  Mackie Pai, PA-C  Cardiologist:  Shirlee More, MD   Referring MD: Mackie Pai, PA-C  ASSESSMENT:    1. Persistent atrial fibrillation ([place])   2. Secondary hypercoagulable state ([place])   3. Chronic anticoagulation    PLAN:    In order of problems listed above:  We discussed her atrial fibrillation age-indeterminate atrial fibrillation as long  as last September however it may have been paroxysmal previously only recently documented heart rate is controlled she is anticoagulation and has only mild left atrial enlargement.  We discussed whether to continue rate control and anticoagulation of either try recent rhythm sinus she prefers cardioversion does not want to take antiarrhythmic drugs worried about side effects and we decided to elective redo cardioversion is successful and address the issue of antiarrhythmic drug afterwards and if unsuccessful to remain in atrial fibrillation with rate control and she has very little symptoms. Should continue her anticoagulation Blood pressure today is out of range she typically runs 130/70 at home and including recent doctor visit and home blood pressure checks we will recheck in the office  Next appointment 6 weeks   Medication Adjustments/Labs and Tests Ordered: Current medicines are reviewed at length with the patient today.  Concerns regarding medicines are outlined above.  No orders of the defined types were placed in this encounter.  No orders of the defined types were placed in this encounter.   Recently recognized atrial fibrillation  History of Present Illness:    [name] Brown is a 80 y.o. female who is being seen today for the evaluation of atrial fibrillation at the request of [name], [name], [place].  She was seen at [facility] ED 01/24/2021 with dizziness transient confusion near loss of consciousness  was found to be in atrial fibrillation.  EKG interpreted as atrial fibrillation nonspecific ST-T changes heart rate 96 bpm.  Laboratory studies included a normal CMP hemoglobin 14.8 chest x-ray no active disease CT of the head with contrast showed no acute intracranial findings.  An echocardiogram performed 01/27/2021 left ventricle is normal in size wall thickness EF was low normal to mildly reduced 50 to 55% with indeterminate diastolic filling pattern and reduced GLS -12%.  Right ventricle is normal in size function and normal pulmonary artery pressure left atrium is mildly dilated and there is no significant valvular abnormality.  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 This episode was prompted by an episode of confusion for a few hours and dizziness She takes a beta-blocker and has rate control of atrial propranolol for treatment. She has been taking her anticoagulant since July 28 without any bleeding medications. She decided to stop taking a statin. She has no known history of heart disease congenital rheumatic or previous atrial fibrillation She is relatively asymptomatic when she goes to the high school football games or track meets and walks a long distance she finds her self weak has to stop and rest no chest pain or shortness of breath no edema and she has not had syncope. She does not snore severely use alcohol and her TSH is normal no history of thyroid disease Past Medical History:  Diagnosis Date   A-fib ([place])  Anxiety    Depression    Essential tremor    Hyperlipidemia     Past Surgical History:  Procedure Laterality Date   ABDOMINAL HYSTERECTOMY     APPENDECTOMY     breast biopsies Bilateral    CYST EXCISION N/A 07/10/2016   Procedure: 3CM CYST EXCISION TRUNK;  Surgeon: Aviva Signs, MD;  Location: [facility]  ORS;  Service: General;  Laterality: N/A;   FL INJ LEFT KNEE CT ARTHROGRAM ([facility] HX)     TONSILLECTOMY      Current Medications: Current Meds  Medication Sig   ALPRAZolam (XANAX) 0.5 MG tablet TAKE 1 TABLET(0.5 MG) BY MOUTH TWICE DAILY AS NEEDED FOR ANXIETY   apixaban (ELIQUIS) 5 MG TABS tablet Take 1 tablet (5 mg total) by mouth 2 (two) times daily.   mirtazapine (REMERON) 30 MG tablet TAKE 1 TABLET(30 MG) BY MOUTH AT BEDTIME   Omega-3 Fatty Acids (FISH OIL) 1000 MG CAPS Take by mouth.   propranolol (INDERAL) 40 MG tablet TAKE 1 TABLET(40 MG) BY MOUTH TWICE DAILY     Allergies:   Penicillins, Cephalosporins, and Prochlorperazine edisylate   Social History   Socioeconomic History   Marital status: Widowed    Spouse name: Not on file   Number of children: 2   Years of education: college   Highest education level: Not on file  Occupational History   Occupation: Retired Therapist, sports  Tobacco Use   Smoking status: Never   Smokeless tobacco: Never  Vaping Use   Vaping Use: Never used  Substance and Sexual Activity   Alcohol use: Yes    Alcohol/week: 1.0 standard drink    Types: 1 Glasses of wine per week    Comment: occasional glass of wine   Drug use: No   Sexual activity: Never  Other Topics Concern   Not on file  Social History Narrative   Lives alone.   Right-handed.   Two children - 1 biological, 1 adopted.   Occasional use of caffeine.   Social Determinants of Health   Financial Resource Strain: Not on file  Food Insecurity: Not on file  Transportation Needs: Not on file  Physical Activity: Not on file  Stress: Not on file  Social Connections: Not on file     Family History: The patient's family history includes Atrial fibrillation in her father, mother, and sister; COPD in her mother; Lung cancer in her father.  ROS:   ROS Please see the history of present illness.     All other systems reviewed and are negative.  EKGs/Labs/Other Studies Reviewed:    The  following studies were reviewed today:   EKG:  EKG is  ordered today.  The ekg ordered today is personally reviewed and demonstrates confirms atrial fibrillation with controlled ventricular rate 75 bpm nonspecific T waves left axis deviation  Recent Labs: 01/24/2021: ALT 18; BUN 18; Creatinine, Ser 0.94; Hemoglobin 14.8; Platelets 227; Potassium 4.2; Sodium 140  Recent Lipid Panel    Component Value Date/Time   CHOL 205 (H) 12/21/2020 0913   TRIG 233.0 (H) 12/21/2020 0913   HDL 41.30 12/21/2020 0913   CHOLHDL 5 12/21/2020 0913   VLDL 46.6 (H) 12/21/2020 0913   LDLCALC 152 (H) 05/18/2010 2005   LDLDIRECT 129.0 12/21/2020 0913    Physical Exam:    VS:  BP (!) 170/110 (BP Location: Right Arm, Patient Position: Sitting)   Pulse 76   Ht '5\' 8"'$  (1.727 m)   Wt 229 lb 1.9 oz (103.9 kg)  SpO2 98%   BMI 34.84 kg/m     Wt Readings from Last 3 Encounters:  02/10/21 229 lb 1.9 oz (103.9 kg)  02/10/21 229 lb 12.8 oz (104.2 kg)  01/06/21 234 lb 3.2 oz (106.2 kg)     GEN: She has a resting tremor well nourished, well developed in no acute distress HEENT: Normal NECK: No JVD; No carotid bruits LYMPHATICS: No lymphadenopathy CARDIAC: Irregular rate and rhythm RRR, no murmurs, rubs, gallops RESPIRATORY:  Clear to auscultation without rales, wheezing or rhonchi  ABDOMEN: Soft, non-tender, non-distended MUSCULOSKELETAL:  No edema; No deformity  SKIN: Warm and dry NEUROLOGIC:  Alert and oriented x 3 PSYCHIATRIC:  Normal affect     Signed, Shirlee More, MD  02/10/2021 9:42 AM    [place]

## 2021-02-10 ENCOUNTER — Other Ambulatory Visit: Payer: Self-pay

## 2021-02-10 ENCOUNTER — Ambulatory Visit: Payer: Medicare Other | Admitting: Cardiology

## 2021-02-10 ENCOUNTER — Encounter: Payer: Self-pay | Admitting: Cardiology

## 2021-02-10 ENCOUNTER — Ambulatory Visit (INDEPENDENT_AMBULATORY_CARE_PROVIDER_SITE_OTHER): Payer: Medicare Other | Admitting: Medical

## 2021-02-10 VITALS — BP 132/80 | HR 80 | Temp 98.5°F | Resp 18 | Ht 68.0 in | Wt 229.8 lb

## 2021-02-10 VITALS — BP 142/76 | HR 76 | Ht 68.0 in | Wt 229.1 lb

## 2021-02-10 DIAGNOSIS — F411 Generalized anxiety disorder: Secondary | ICD-10-CM | POA: Diagnosis not present

## 2021-02-10 DIAGNOSIS — E785 Hyperlipidemia, unspecified: Secondary | ICD-10-CM | POA: Diagnosis not present

## 2021-02-10 DIAGNOSIS — I4819 Other persistent atrial fibrillation: Secondary | ICD-10-CM | POA: Diagnosis not present

## 2021-02-10 DIAGNOSIS — D6869 Other thrombophilia: Secondary | ICD-10-CM | POA: Diagnosis not present

## 2021-02-10 DIAGNOSIS — I10 Essential (primary) hypertension: Secondary | ICD-10-CM

## 2021-02-10 DIAGNOSIS — R251 Tremor, unspecified: Secondary | ICD-10-CM | POA: Diagnosis not present

## 2021-02-10 DIAGNOSIS — R739 Hyperglycemia, unspecified: Secondary | ICD-10-CM | POA: Diagnosis not present

## 2021-02-10 DIAGNOSIS — Z7901 Long term (current) use of anticoagulants: Secondary | ICD-10-CM

## 2021-02-10 DIAGNOSIS — R42 Dizziness and giddiness: Secondary | ICD-10-CM

## 2021-02-10 NOTE — Patient Instructions (Addendum)
Your dizziness has resolved about 2 weeks ago. Work up negative in ED.   Anxiety controlled with current use of xanax. Uds and contract up to date.  Hx of tremor controlled on b-blocker.  Htn hx. Bp well controlled on inderal.  Hx of elevated blood sugar but on review no diabetes per a1c review. Continue low sugar diet  Hx of high cholesterol. Pt has not started rx med yet. Pt filled but has not started.   For atrial fib continue eliquis and follow up with cardiologist.  Follow up in 3 months or sooner if needed.

## 2021-02-10 NOTE — Patient Instructions (Signed)
Medication Instructions:  Your physician recommends that you continue on your current medications as directed. Please refer to the Current Medication list given to you today.  *If you need a refill on your cardiac medications before your next appointment, please call your pharmacy*   Lab Work: Your physician recommends that you return for lab work in: TODAY CBC, BMP If you have labs (blood work) drawn today and your tests are completely normal, you will receive your results only by: [place] (if you have [place]) OR A paper copy in the mail If you have any lab test that is abnormal or we need to change your treatment, we will call you to review the results.   Testing/Procedures: You are scheduled for a TEE/Cardioversion/TEE Cardioversion on 02/15/2021 with Dr. Su Grand.  Please arrive at the [facility] (Main Entrance A) at [facility]: [address] [place], [place] [address] at 7:30 am.   DIET: Nothing to eat or drink after midnight except a sip of water with medications (see medication instructions below)  FYI: For your safety, and to allow [place] to monitor your vital signs accurately during the surgery/procedure we request that   if you have artificial nails, gel coating, SNS etc. Please have those removed prior to your surgery/procedure. Not having the nail coverings /polish removed may result in cancellation or delay of your surgery/procedure.   Medication Instructions:  Continue your anticoagulant: Eliquis You will need to continue your anticoagulant after your procedure until you  are told by your  Provider that it is safe to stop   Labs: YOU HAD YOUR LABS DRAWN TODAY 02/10/2021    You must have a responsible person to drive you home and stay in the waiting area during your procedure. Failure to do so could result in cancellation.  Bring your insurance cards.  *Special Note: Every effort is made to have your procedure done on time. Occasionally there are  emergencies that occur at the hospital that may cause delays. Please be patient if a delay does occur.     Follow-Up: At [facility], you and your health needs are our priority.  As part of our continuing mission to provide you with exceptional heart care, we have created designated Provider Care Teams.  These Care Teams include your primary Cardiologist (physician) and Advanced Practice Providers (APPs -  Physician Assistants and Nurse Practitioners) who all work together to provide you with the care you need, when you need it.  We recommend signing up for the patient portal called "MyChart".  Sign up information is provided on this After Visit Summary.  MyChart is used to connect with patients for Virtual Visits (Telemedicine).  Patients are able to view lab/test results, encounter notes, upcoming appointments, etc.  Non-urgent messages can be sent to your provider as well.   To learn more about what you can do with MyChart, go to [email or web address].    Your next appointment:   8 week(s)  The format for your next appointment:   In Person  Provider:   Shirlee More, MD   Other Instructions

## 2021-02-10 NOTE — Progress Notes (Signed)
Subjective:    Patient ID: [name] Brown, female    DOB: [birth year], 80 y.o.   MRN: [id number]  HPI  Pt in for follow up.  Pt had dizziness recently. Pt went to ED and was worked up. Pt states day after ED visit dizziness resolved. Ekg showed rate controlled a-fib. Ct head negative. Mri considered but not done.  Pt anxiety controlled with medications. She takes medication up to twice a day.  She has tremors and is on b blocker.  Hx of a-fib. Pt is on eliquis. Seeing cardiologist this week.    Review of Systems  Constitutional:  Negative for chills, fatigue and fever.  Respiratory:  Negative for chest tightness, shortness of breath and wheezing.   Cardiovascular:  Negative for chest pain and palpitations.  Gastrointestinal:  Negative for abdominal pain.  Musculoskeletal:  Negative for back pain.  Neurological:  Negative for dizziness and weakness.  Psychiatric/Behavioral:  Negative for behavioral problems, dysphoric mood and hallucinations. The patient is nervous/anxious.        Controlled.    Past Medical History:  Diagnosis Date   A-fib [facility])    Anxiety    Depression    Essential tremor    Hyperlipidemia      Social History   Socioeconomic History   Marital status: Widowed    Spouse name: Not on file   Number of children: 2   Years of education: college   Highest education level: Not on file  Occupational History   Occupation: Retired Therapist, sports  Tobacco Use   Smoking status: Never   Smokeless tobacco: Never  Vaping Use   Vaping Use: Never used  Substance and Sexual Activity   Alcohol use: Yes    Alcohol/week: 1.0 standard drink    Types: 1 Glasses of wine per week    Comment: occasional glass of wine   Drug use: No   Sexual activity: Never  Other Topics Concern   Not on file  Social History Narrative   Lives alone.   Right-handed.   Two children - 1 biological, 1 adopted.   Occasional use of caffeine.   Social Determinants of Health   Financial  Resource Strain: Not on file  Food Insecurity: Not on file  Transportation Needs: Not on file  Physical Activity: Not on file  Stress: Not on file  Social Connections: Not on file  Intimate Partner Violence: Not on file    Past Surgical History:  Procedure Laterality Date   ABDOMINAL HYSTERECTOMY     APPENDECTOMY     breast biopsies Bilateral    CYST EXCISION N/A 07/10/2016   Procedure: [place];  Surgeon: Aviva Signs, MD;  Location: [facility] ORS;  Service: General;  Laterality: N/A;   FL INJ LEFT KNEE CT ARTHROGRAM ([place] HX)     TONSILLECTOMY      Family History  Problem Relation Age of Onset   Atrial fibrillation Mother    COPD Mother    Atrial fibrillation Father    Lung cancer Father    Atrial fibrillation Sister     Allergies  Allergen Reactions   Penicillins Anaphylaxis    Has patient had a PCN reaction causing immediate rash, facial/tongue/throat swelling, SOB or lightheadedness with hypotension: Yes Has patient had a PCN reaction causing severe rash involving mucus membranes or skin necrosis: No Has patient had a PCN reaction that required hospitalization Yes Has patient had a PCN reaction occurring within the last 10 years: Yes If all  of the above answers are "NO", then may proceed with Cephalosporin use.    Cephalosporins Other (See Comments)    Due to allergic reaction to penicillin   Prochlorperazine Edisylate     REACTION: draws mouth to side    Current Outpatient Medications on File Prior to Visit  Medication Sig Dispense Refill   ALPRAZolam (XANAX) 0.5 MG tablet TAKE 1 TABLET(0.5 MG) BY MOUTH TWICE DAILY AS NEEDED FOR ANXIETY 30 tablet 3   apixaban (ELIQUIS) 5 MG TABS tablet Take 1 tablet (5 mg total) by mouth 2 (two) times daily. 60 tablet 3   mirtazapine (REMERON) 30 MG tablet TAKE 1 TABLET(30 MG) BY MOUTH AT BEDTIME 30 tablet 2   Omega-3 Fatty Acids (FISH OIL) 1000 MG CAPS Take by mouth.     propranolol (INDERAL) 40 MG tablet TAKE 1  TABLET(40 MG) BY MOUTH TWICE DAILY 60 tablet 3   No current facility-administered medications on file prior to visit.    BP 132/80 (BP Location: Left Arm, Patient Position: Sitting, Cuff Size: Large)   Pulse 80   Temp 98.5 F (36.9 C) (Oral)   Resp 18   Ht '5\' 8"'$  (1.727 m)   Wt 229 lb 12.8 oz (104.2 kg)   SpO2 95%   BMI 34.94 kg/m       Objective:   Physical Exam  General Mental Status- Alert. General Appearance- Not in acute distress.   Skin General: Color- Normal Color. Moisture- Normal Moisture.  Neck Carotid Arteries- Normal color. Moisture- Normal Moisture. No carotid bruits. No JVD.  Chest and Lung Exam Auscultation: Breath Sounds:-Normal.  Cardiovascular Auscultation:Rythm- Regular. Murmurs & Other Heart Sounds:Auscultation of the heart reveals- No Murmurs.  Abdomen Inspection:-Inspeection Normal. Palpation/Percussion:Note:No mass. Palpation and Percussion of the abdomen reveal- Non Tender, Non Distended + BS, no rebound or guarding.   Neurologic Cranial Nerve exam:- CN III-XII intact(No nystagmus), symmetric smile. Strength:- 5/5 equal and symmetric strength both upper and lower extremities.       Assessment & Plan:   Patient Instructions  Your dizziness has resolved about 2 weeks ago. Work up negative in ED.   Anxiety controlled with current use of xanax. Uds and contract up to date.  Hx of tremor controlled on b-blocker.  Htn hx. Bp well controlled on inderal.  Hx of elevated blood sugar but on review no diabetes per a1c review. Continue low sugar diet  Hx of high cholesterol. Pt has not started rx med yet. Pt filled but has not started.   For atrial fib continue eliquis and follow up with cardiologist.  Follow up in 3 months or sooner if needed.   Mackie Pai, PA-C

## 2021-02-11 ENCOUNTER — Telehealth: Payer: Self-pay

## 2021-02-11 LAB — BASIC METABOLIC PANEL
BUN/Creatinine Ratio: 19 (ref 12–28)
BUN: 18 mg/dL (ref 8–27)
CO2: 23 mmol/L (ref 20–29)
Calcium: 9.4 mg/dL (ref 8.7–10.3)
Chloride: 103 mmol/L (ref 96–106)
Creatinine, Ser: 0.97 mg/dL (ref 0.57–1.00)
Glucose: 112 mg/dL — ABNORMAL HIGH (ref 65–99)
Potassium: 4.6 mmol/L (ref 3.5–5.2)
Sodium: 142 mmol/L (ref 134–144)
eGFR: 60 mL/min/{1.73_m2} (ref >59)

## 2021-02-11 LAB — CBC
Hematocrit: 45 % (ref 34.0–46.6)
Hemoglobin: 15.1 g/dL (ref 11.1–15.9)
MCH: 29.8 pg (ref 26.6–33.0)
MCHC: 33.6 g/dL (ref 31.5–35.7)
MCV: 89 fL (ref 79–97)
Platelets: 240 10*3/uL (ref 150–450)
RBC: 5.07 x10E6/uL (ref 3.77–5.28)
RDW: 13.5 % (ref 11.7–15.4)
WBC: 5.8 10*3/uL (ref 3.4–10.8)

## 2021-02-11 NOTE — Telephone Encounter (Signed)
Spoke with patient regarding results and recommendation.  Patient verbalizes understanding and is agreeable to plan of care. Advised patient to call back with any issues or concerns.  

## 2021-02-11 NOTE — Telephone Encounter (Signed)
-----   Message from Richardo Priest, MD sent at 02/11/2021  7:49 AM EDT ----- Normal or stable result  She is for outpatient cardioversion

## 2021-02-14 NOTE — Anesthesia Preprocedure Evaluation (Addendum)
Anesthesia Evaluation  Patient identified by MRN, date of birth, ID band Patient awake    Reviewed: Allergy & Precautions, NPO status , Patient's Chart, lab work & pertinent test results  Airway Mallampati: II  TM Distance: >3 FB Neck ROM: Full    Dental  (+) Dental Advisory Given, Edentulous Upper, Edentulous Lower   Pulmonary shortness of breath and with exertion,    Pulmonary exam normal breath sounds clear to auscultation       Cardiovascular  Rhythm:Irregular Rate:Normal  Echo  1. Echo from 08/22/17 not available to compare on syngo.  2. Left ventricular ejection fraction, by estimation, is 50 to 55%. The left ventricle has low normal function. The left ventricle has no regional wall motion abnormalities. Left ventricular diastolic parameters are indeterminate. The average left ventricular global longitudinal strain is -12.0 %. The global longitudinal strain is abnormal.  3. Right ventricular systolic function is normal. The right ventricular size is normal. There is normal pulmonary artery systolic pressure.  4. Left atrial size was mildly dilated.  5. The mitral valve is normal in structure. Trivial mitral valve regurgitation. No evidence of mitral stenosis.  6. The aortic valve is tricuspid. There is mild calcification of the aortic valve. Aortic valve regurgitation is not visualized. Mild aortic valve sclerosis is present, with no evidence of aortic valve stenosis.  7. The inferior vena cava is normal in size with greater than 50% respiratory variability, suggesting right atrial pressure of 3 mmHg.    Neuro/Psych PSYCHIATRIC DISORDERS Anxiety Depression    GI/Hepatic GERD  ,  Endo/Other  diabetesHypothyroidism   Renal/GU Renal disease     Musculoskeletal   Abdominal   Peds  Hematology   Anesthesia Other Findings   Reproductive/Obstetrics                            Anesthesia  Physical  Anesthesia Plan  ASA: 3  Anesthesia Plan: MAC   Post-op Pain Management:    Induction: Intravenous  PONV Risk Score and Plan:   Airway Management Planned: Simple Face Mask  Additional Equipment:   Intra-op Plan:   Post-operative Plan:   Informed Consent: I have reviewed the patients History and Physical, chart, labs and discussed the procedure including the risks, benefits and alternatives for the proposed anesthesia with the patient or authorized representative who has indicated his/her understanding and acceptance.     Dental advisory given  Plan Discussed with: CRNA  Anesthesia Plan Comments:        Anesthesia Quick Evaluation

## 2021-02-15 ENCOUNTER — Ambulatory Visit (HOSPITAL_COMMUNITY): Payer: Medicare Other | Admitting: Anesthesiology

## 2021-02-15 ENCOUNTER — Ambulatory Visit (HOSPITAL_COMMUNITY)
Admission: RE | Admit: 2021-02-15 | Discharge: 2021-02-15 | Disposition: A | Discharge status: Home / Self Care | Payer: Medicare Other | Attending: Cardiology | Admitting: Cardiology

## 2021-02-15 ENCOUNTER — Encounter (HOSPITAL_COMMUNITY)
Admission: RE | Disposition: A | Discharge status: Home / Self Care | Payer: Self-pay | Source: Home / Self Care | Attending: Cardiology

## 2021-02-15 ENCOUNTER — Encounter (HOSPITAL_COMMUNITY): Payer: Self-pay | Admitting: Cardiology

## 2021-02-15 DIAGNOSIS — K219 Gastro-esophageal reflux disease without esophagitis: Secondary | ICD-10-CM | POA: Diagnosis not present

## 2021-02-15 DIAGNOSIS — Z79899 Other long term (current) drug therapy: Secondary | ICD-10-CM | POA: Insufficient documentation

## 2021-02-15 DIAGNOSIS — D6869 Other thrombophilia: Secondary | ICD-10-CM | POA: Insufficient documentation

## 2021-02-15 DIAGNOSIS — Z881 Allergy status to other antibiotic agents status: Secondary | ICD-10-CM | POA: Diagnosis not present

## 2021-02-15 DIAGNOSIS — Z7901 Long term (current) use of anticoagulants: Secondary | ICD-10-CM | POA: Insufficient documentation

## 2021-02-15 DIAGNOSIS — I4819 Other persistent atrial fibrillation: Secondary | ICD-10-CM | POA: Diagnosis not present

## 2021-02-15 DIAGNOSIS — M8580 Other specified disorders of bone density and structure, unspecified site: Secondary | ICD-10-CM | POA: Diagnosis not present

## 2021-02-15 DIAGNOSIS — Z880 Allergy status to penicillin: Secondary | ICD-10-CM | POA: Diagnosis not present

## 2021-02-15 HISTORY — PX: CARDIOVERSION: SHX1299

## 2021-02-15 SURGERY — CARDIOVERSION
Anesthesia: General

## 2021-02-15 MED ORDER — LIDOCAINE 2% (20 MG/ML) 5 ML SYRINGE
INTRAMUSCULAR | Status: DC | PRN
Start: 2021-02-15 — End: 2021-02-15
  Administered 2021-02-15: 100 mg via INTRAVENOUS

## 2021-02-15 MED ORDER — SODIUM CHLORIDE 0.9 % IV SOLN: INTRAVENOUS | Status: DC

## 2021-02-15 MED ORDER — PROPOFOL 10 MG/ML IV BOLUS
INTRAVENOUS | Status: DC | PRN
  Administered 2021-02-15: 70 mg via INTRAVENOUS

## 2021-02-15 NOTE — CV Procedure (Signed)
Procedure:   DCCV  Indication:  Symptomatic atrial fibrillation  Procedure Note:  The patient signed informed consent.  They have had had therapeutic anticoagulation with Eliquis greater than 3 weeks.  Anesthesia was administered by Dr. Lissa Hoard.  Adequate airway was maintained throughout and vital followed per protocol.  They were cardioverted x 3 with 200J of biphasic synchronized energy.  Cardioversion was not successful.  Following first shock at 200J, remained in Afib.  For second 200J shock,pressure was applied to anterior pad but remained in Afib.  For third 200J shock, anterior pad was repositioned and pressure applied but remained in Afib.  There were no apparent complications.  The patient had normal neuro status and respiratory status post procedure with vitals stable as recorded elsewhere.     Follow up:  They will continue on current medical therapy and follow up with cardiology as scheduled.  Oswaldo Milian, MD 02/15/2021 9:01 AM

## 2021-02-15 NOTE — Anesthesia Procedure Notes (Signed)
Procedure Name: General with mask airway Date/Time: 02/15/2021 8:35 AM Performed by: Betha Loa, CRNA Pre-anesthesia Checklist: Patient identified, Emergency Drugs available, Suction available, Patient being monitored and Timeout performed Patient Re-evaluated:Patient Re-evaluated prior to induction Oxygen Delivery Method: Ambu bag Induction Type: IV induction Ventilation: Mask ventilation without difficulty Placement Confirmation: positive ETCO2 Dental Injury: Teeth and Oropharynx as per pre-operative assessment

## 2021-02-15 NOTE — Anesthesia Postprocedure Evaluation (Signed)
Anesthesia Post Note  Patient: [name] Brown  Procedure(s) Performed: CARDIOVERSION     Patient location during evaluation: PACU Anesthesia Type: General Level of consciousness: sedated and patient cooperative Pain management: pain level controlled Vital Signs Assessment: post-procedure vital signs reviewed and stable Respiratory status: spontaneous breathing Cardiovascular status: stable Anesthetic complications: no   No notable events documented.  Last Vitals:  Vitals:   02/15/21 0851 02/15/21 0900  BP: 125/71 123/63  Pulse: 64 (!) 55  Resp: (!) 25 16  Temp:    SpO2: 97% 95%    Last Pain:  Vitals:   02/15/21 0900  TempSrc:   PainSc: 0-No pain                 Nolon Nations

## 2021-02-15 NOTE — Discharge Instructions (Signed)
Electrical Cardioversion  Electrical cardioversion is the delivery of a jolt of electricity to restore a normal rhythm to the heart. A rhythm that is too fast or is not regular keeps the heart from pumping well. In this procedure, sticky patches or metal paddles are placed on the chest to deliver electricity to the heart from a device.  What can I expect after the procedure?  Your blood pressure, heart rate, breathing rate, and blood oxygen level will be monitored until you leave the hospital or clinic.  Your heart rhythm will be watched to make sure it does not change.  You may have some redness on the skin where the shocks were given.If this occurs, can use hydrocortisone cream or Aloe vera.  Follow these instructions at home:  Do not drive for 24 hours if you were given a sedative during your procedure.  Take over-the-counter and prescription medicines only as told by your health care provider.  Ask your health care provider how to check your pulse. Check it often.  Rest for 48 hours after the procedure or as told by your health care provider.  Avoid or limit your caffeine use as told by your health care provider.  Keep all follow-up visits as told by your health care provider. This is important.  Contact a health care provider if:  You feel like your heart is beating too quickly or your pulse is not regular.  You have a serious muscle cramp that does not go away.  Get help right away if:  You have discomfort in your chest.  You are dizzy or you feel faint.  You have trouble breathing or you are short of breath.  Your speech is slurred.  You have trouble moving an arm or leg on one side of your body.  Your fingers or toes turn cold or blue.  Summary  Electrical cardioversion is the delivery of a jolt of electricity to restore a normal rhythm to the heart.  This procedure may be done right away in an emergency or may be a scheduled procedure if the condition is not  an emergency.  Generally, this is a safe procedure.  After the procedure, check your pulse often as told by your health care provider.  This information is not intended to replace advice given to you by your health care provider. Make sure you discuss any questions you have with your health care provider. Document Revised: 12/30/2018 Document Reviewed: 12/30/2018 Elsevier Patient Education  2021 Elsevier Inc.  

## 2021-02-15 NOTE — Transfer of Care (Signed)
Immediate Anesthesia Transfer of Care Note  Patient: [name] Brown  Procedure(s) Performed: CARDIOVERSION  Patient Location: Endoscopy Unit  Anesthesia Type:General  Level of Consciousness: awake and alert   Airway & Oxygen Therapy: Patient Spontanous Breathing  Post-op Assessment: Report given to RN and Post -op Vital signs reviewed and stable  Post vital signs: Reviewed and stable  Last Vitals:  Vitals Value Taken Time  BP    Temp    Pulse    Resp    SpO2      Last Pain:  Vitals:   02/15/21 0800  TempSrc: Oral  PainSc: 0-No pain         Complications: No notable events documented.

## 2021-02-16 ENCOUNTER — Encounter (HOSPITAL_COMMUNITY): Payer: Self-pay | Admitting: Cardiology

## 2021-02-22 NOTE — Interval H&P Note (Signed)
History and Physical Interval Note:  02/22/2021 8:26 AM  [name] Brown  has presented today for surgery, with the diagnosis of AFIB.  The various methods of treatment have been discussed with the patient and family. After consideration of risks, benefits and other options for treatment, the patient has consented to  Procedure(s): CARDIOVERSION (N/A) as a surgical intervention.  The patient's history has been reviewed, patient examined, no change in status, stable for surgery.  I have reviewed the patient's chart and labs.  Questions were answered to the patient's satisfaction.     [name]

## 2021-03-23 ENCOUNTER — Ambulatory Visit: Payer: Medicare Other | Admitting: Medical

## 2021-04-04 DIAGNOSIS — E785 Hyperlipidemia, unspecified: Secondary | ICD-10-CM | POA: Insufficient documentation

## 2021-04-08 ENCOUNTER — Ambulatory Visit: Payer: Medicare Other | Admitting: Cardiology

## 2021-04-12 ENCOUNTER — Other Ambulatory Visit: Payer: Self-pay | Admitting: Medical

## 2021-04-12 NOTE — Telephone Encounter (Addendum)
Requesting:xanax Contract:07/28/20 UDS:07/28/20 Last Visit:02/10/21 Next Visit:05/12/21 Last Refill:02/08/21  Please Advise   I gave pt 60 tab refill but have her follow up in a month.   Looks like I had given 30 tab rx in past. Had hopes she would use less frequently. What does her contract state? If only 30 tab will need to change when she comes in.

## 2021-05-12 ENCOUNTER — Ambulatory Visit (INDEPENDENT_AMBULATORY_CARE_PROVIDER_SITE_OTHER): Payer: Medicare Other | Admitting: Medical

## 2021-05-12 ENCOUNTER — Encounter: Payer: Self-pay | Admitting: Medical

## 2021-05-12 VITALS — BP 132/84 | HR 80 | Temp 97.9°F | Resp 18 | Ht 68.0 in | Wt 227.6 lb

## 2021-05-12 DIAGNOSIS — E785 Hyperlipidemia, unspecified: Secondary | ICD-10-CM | POA: Diagnosis not present

## 2021-05-12 DIAGNOSIS — I10 Essential (primary) hypertension: Secondary | ICD-10-CM | POA: Diagnosis not present

## 2021-05-12 DIAGNOSIS — F411 Generalized anxiety disorder: Secondary | ICD-10-CM

## 2021-05-12 DIAGNOSIS — R739 Hyperglycemia, unspecified: Secondary | ICD-10-CM

## 2021-05-12 DIAGNOSIS — Z7185 Encounter for immunization safety counseling: Secondary | ICD-10-CM | POA: Diagnosis not present

## 2021-05-12 DIAGNOSIS — I4819 Other persistent atrial fibrillation: Secondary | ICD-10-CM

## 2021-05-12 DIAGNOSIS — R251 Tremor, unspecified: Secondary | ICD-10-CM

## 2021-05-12 LAB — COMPREHENSIVE METABOLIC PANEL
ALT: 19 U/L (ref 0–35)
AST: 18 U/L (ref 0–37)
Albumin: 3.9 g/dL (ref 3.5–5.2)
Alkaline Phosphatase: 65 U/L (ref 39–117)
BUN: 14 mg/dL (ref 6–23)
CO2: 30 mEq/L (ref 19–32)
Calcium: 8.9 mg/dL (ref 8.4–10.5)
Chloride: 108 mEq/L (ref 96–112)
Creatinine, Ser: 0.92 mg/dL (ref 0.40–1.20)
GFR: 59.7 mL/min — ABNORMAL LOW (ref >60.00)
Glucose, Bld: 112 mg/dL — ABNORMAL HIGH (ref 70–99)
Potassium: 4.2 mEq/L (ref 3.5–5.1)
Sodium: 143 mEq/L (ref 135–145)
Total Bilirubin: 0.6 mg/dL (ref 0.2–1.2)
Total Protein: 6.7 g/dL (ref 6.0–8.3)

## 2021-05-12 LAB — LDL CHOLESTEROL, DIRECT: Direct LDL: 121 mg/dL

## 2021-05-12 LAB — LIPID PANEL
Cholesterol: 188 mg/dL (ref 0–200)
HDL: 34.8 mg/dL — ABNORMAL LOW (ref >39.00)
NonHDL: 153.25
Total CHOL/HDL Ratio: 5
Triglycerides: 247 mg/dL — ABNORMAL HIGH (ref 0.0–149.0)
VLDL: 49.4 mg/dL — ABNORMAL HIGH (ref 0.0–40.0)

## 2021-05-12 LAB — HEMOGLOBIN A1C: Hgb A1c MFr Bld: 5.9 % (ref 4.6–6.5)

## 2021-05-12 MED ORDER — APIXABAN 5 MG PO TABS: 5.0000 mg | ORAL_TABLET | Freq: Two times a day (BID) | ORAL | 3 refills | Status: DC

## 2021-05-12 MED ORDER — ALPRAZOLAM 0.5 MG PO TABS: ORAL_TABLET | ORAL | 2 refills | Status: DC

## 2021-05-12 NOTE — Patient Instructions (Addendum)
Atrial fibrillation rate controlled. Continue eliquis and b blocker. Refilling eliquis today.  Htn- well controlled on b- blocker alone.  Anxiety controlled with xanax. Continue. Refilled today. Up to date on uds and contract.  Tremor controlled on b blocker.   High cholesterol. Deciding to just use fish oil. Reminder on your risk score. Can consider zetia since not wanting to use statin..  The 10-year ASCVD risk score (Arnett DK, et al., 2019) is: 48.2%   Values used to calculate the score:     Age: 80 years     Sex: Female     Is Non-Hispanic African American: No     Diabetic: Yes     Tobacco smoker: No     Systolic Blood Pressure: 244 mmHg     Is BP treated: Yes     HDL Cholesterol: 41.3 mg/dL     Total Cholesterol: 205 mg/dL   Elevated sugar in past. Will get a1c. In addition cmp and lipid panel.  Follow up 3 months or sooner if needed.

## 2021-05-12 NOTE — Progress Notes (Signed)
Subjective:    Patient ID: [name] Brown, female    DOB: [birth year], 80 y.o.   MRN: [id number]  HPI  Pt in for follow up.  Pt has atrial fibrillation. Pt on eliquis.  Anxiety controlled with current use of xanax. Uds and contract up to date.   Tremor controlled on b-blocker.   Htn well controlled on inderal.  High cholesterol. Pt decided not to take statin or zetia. She is only on fish oil.     Review of Systems  Constitutional:  Negative for chills, fatigue and fever.  Respiratory:  Negative for chest tightness, shortness of breath and wheezing.   Cardiovascular:  Negative for chest pain and palpitations.  Gastrointestinal:  Negative for abdominal pain.  Musculoskeletal:  Negative for back pain.  Neurological:  Negative for dizziness and weakness.  Psychiatric/Behavioral:  Negative for behavioral problems, dysphoric mood and hallucinations. The patient is nervous/anxious.        Controlled.   Past Medical History:  Diagnosis Date   A-fib [facility])    Anxiety    Depression    Essential tremor    Hyperlipidemia      Social History   Socioeconomic History   Marital status: Widowed    Spouse name: Not on file   Number of children: 2   Years of education: college   Highest education level: Not on file  Occupational History   Occupation: Retired Therapist, sports  Tobacco Use   Smoking status: Never   Smokeless tobacco: Never  Vaping Use   Vaping Use: Never used  Substance and Sexual Activity   Alcohol use: Yes    Alcohol/week: 1.0 standard drink    Types: 1 Glasses of wine per week    Comment: occasional glass of wine   Drug use: No   Sexual activity: Never  Other Topics Concern   Not on file  Social History Narrative   Lives alone.   Right-handed.   Two children - 1 biological, 1 adopted.   Occasional use of caffeine.   Social Determinants of Health   Financial Resource Strain: Not on file  Food Insecurity: Not on file  Transportation Needs: Not on file   Physical Activity: Not on file  Stress: Not on file  Social Connections: Not on file  Intimate Partner Violence: Not on file    Past Surgical History:  Procedure Laterality Date   ABDOMINAL HYSTERECTOMY     APPENDECTOMY     breast biopsies Bilateral    CARDIOVERSION N/A 02/15/2021   Procedure: CARDIOVERSION;  Surgeon: [name], MD;  Location: [place];  Service: Cardiovascular;  Laterality: N/A;   CYST EXCISION N/A 07/10/2016   Procedure: 3CM CYST EXCISION TRUNK;  Surgeon: Aviva Signs, MD;  Location: [facility];  Service: General;  Laterality: N/A;   FL INJ LEFT KNEE CT ARTHROGRAM ([facility] HX)     TONSILLECTOMY      Family History  Problem Relation Age of Onset   Atrial fibrillation Mother    COPD Mother    Atrial fibrillation Father    Lung cancer Father    Atrial fibrillation Sister     Allergies  Allergen Reactions   Penicillins Anaphylaxis    Has patient had a PCN reaction causing immediate rash, facial/tongue/throat swelling, SOB or lightheadedness with hypotension: Yes Has patient had a PCN reaction causing severe rash involving mucus membranes or skin necrosis: No Has patient had a PCN reaction that required hospitalization Yes Has patient had a PCN reaction occurring  within the last 10 years: Yes If all of the above answers are "NO", then may proceed with Cephalosporin use.    Cephalosporins Other (See Comments)    Due to allergic reaction to penicillin   Prochlorperazine Edisylate     REACTION: draws mouth to side    Current Outpatient Medications on File Prior to Visit  Medication Sig Dispense Refill   acetaminophen (TYLENOL) 500 MG tablet Take 1,500 mg by mouth daily as needed for moderate pain.     ALPRAZolam (XANAX) 0.5 MG tablet TAKE 1 TABLET(0.5 MG) BY MOUTH TWICE DAILY AS NEEDED FOR ANXIETY 60 tablet 0   mirtazapine (REMERON) 30 MG tablet TAKE 1 TABLET(30 MG) BY MOUTH AT BEDTIME 30 tablet 2   Omega-3 Fatty Acids (FISH OIL) 1000 MG CAPS Take  2,000 mg by mouth at bedtime.     propranolol (INDERAL) 40 MG tablet TAKE 1 TABLET(40 MG) BY MOUTH TWICE DAILY 60 tablet 3   apixaban (ELIQUIS) 5 MG TABS tablet Take 1 tablet (5 mg total) by mouth 2 (two) times daily. 60 tablet 3   No current facility-administered medications on file prior to visit.    BP 132/84   Pulse 80   Temp 97.9 F (36.6 C)   Resp 18   Ht 5\' 8"  (1.727 m)   Wt 227 lb 9.6 oz (103.2 kg)   SpO2 97%   BMI 34.61 kg/m       Objective:   Physical Exam  General Mental Status- Alert. General Appearance- Not in acute distress.   Skin General: Color- Normal Color. Moisture- Normal Moisture.  Neck Carotid Arteries- Normal color. Moisture- Normal Moisture. No carotid bruits. No JVD.  Chest and Lung Exam Auscultation: Breath Sounds:-Normal.  Cardiovascular Auscultation:Rythm- Regular. Murmurs & Other Heart Sounds:Auscultation of the heart reveals- No Murmurs.  Abdomen Inspection:-Inspeection Normal. Palpation/Percussion:Note:No mass. Palpation and Percussion of the abdomen reveal- Non Tender, Non Distended + BS, no rebound or guarding.   Neurologic Cranial Nerve exam:- CN III-XII intact(No nystagmus), symmetric smile. Strength:- 5/5 equal and symmetric strength both upper and lower extremities.       Assessment & Plan:

## 2021-05-19 ENCOUNTER — Other Ambulatory Visit: Payer: Self-pay

## 2021-05-19 ENCOUNTER — Ambulatory Visit: Payer: Medicare Other | Admitting: Cardiology

## 2021-05-19 ENCOUNTER — Encounter: Payer: Self-pay | Admitting: Cardiology

## 2021-05-19 VITALS — BP 144/80 | HR 97 | Ht 68.0 in | Wt 227.0 lb

## 2021-05-19 DIAGNOSIS — E782 Mixed hyperlipidemia: Secondary | ICD-10-CM

## 2021-05-19 DIAGNOSIS — Z7901 Long term (current) use of anticoagulants: Secondary | ICD-10-CM

## 2021-05-19 DIAGNOSIS — I4819 Other persistent atrial fibrillation: Secondary | ICD-10-CM

## 2021-05-19 NOTE — Patient Instructions (Signed)

## 2021-05-19 NOTE — Progress Notes (Signed)
Cardiology Office Note:    Date:  05/19/2021   ID:  [name] Brown, DOB Aug 02, 1940, MRN [id number]  PCP:  Mackie Pai, PA-C  Cardiologist:  Shirlee More, MD    Referring MD: Mackie Pai, PA-C    ASSESSMENT:    1. Persistent atrial fibrillation ([place])   2. Chronic anticoagulation   3. Mixed hyperlipidemia    PLAN:    In order of problems listed above:  Unfortunately we are unable to restore sinus rhythm she is minimally symptomatic from atrial fibrillation and will continue with rate control and anticoagulation. Continue her current anticoagulant Continue fish oil supplement Continue to trend blood pressures she was in a terrible hurry getting here today coming from childcare and subsequent repeat blood pressure was mildly elevated   Next appointment: I will see back in the office as needed   Medication Adjustments/Labs and Tests Ordered: Current medicines are reviewed at length with the patient today.  Concerns regarding medicines are outlined above.  Orders Placed This Encounter  Procedures   EKG 12-Lead   No orders of the defined types were placed in this encounter.   Chief Complaint  Patient presents with   Follow-up   Atrial Fibrillation    .[facility]  History of Present Illness:    [name] Brown is a 80 y.o. female with a hx of persistent atrial fibrillation hypertension and chronic anticoagulation. She was seen at [facility] ED 01/24/2021 with dizziness transient confusion near loss of consciousness was found to be in atrial fibrillation.  EKG interpreted as atrial fibrillation nonspecific ST-T changes heart rate 96 bpm.  Laboratory studies included a normal CMP hemoglobin 14.8 chest x-ray no active disease CT of the head with contrast showed no acute intracranial findings.   An echocardiogram performed 01/27/2021 left ventricle is normal in size wall thickness EF was low normal to mildly reduced 50 to 55% with indeterminate diastolic  filling pattern and reduced GLS -12%.  Right ventricle is normal in size function and normal pulmonary artery pressure left atrium is mildly dilated and there is no significant valvular abnormality.  She was last seen 02/10/2021.  An attempted cardioversion 02/15/2021 was unsuccessful.  Compliance with diet, lifestyle and medications: Yes  Remains very vigorous active woman watches small children.  When she has to walk a longer distance she tires and has to stop and rest.  Does not occur during usual activities no edema orthopnea chest pain palpitation or syncope.  She tolerates her anticoagulant without bleeding.  Blood pressures are checked at home and have been in range. Past Medical History:  Diagnosis Date   A-fib [facility])    Anxiety    Depression    Essential tremor    Hyperlipidemia     Past Surgical History:  Procedure Laterality Date   ABDOMINAL HYSTERECTOMY     APPENDECTOMY     breast biopsies Bilateral    CARDIOVERSION N/A 02/15/2021   Procedure: CARDIOVERSION;  Surgeon: [name], MD;  Location: [place];  Service: Cardiovascular;  Laterality: N/A;   CYST EXCISION N/A 07/10/2016   Procedure: 3CM CYST EXCISION TRUNK;  Surgeon: Aviva Signs, MD;  Location: [facility];  Service: General;  Laterality: N/A;   FL INJ LEFT KNEE CT ARTHROGRAM ([facility] HX)     TONSILLECTOMY      Current Medications: Current Meds  Medication Sig   acetaminophen (TYLENOL) 500 MG tablet Take 1,500 mg by mouth daily as needed for moderate pain.   ALPRAZolam (XANAX) 0.5  MG tablet TAKE 1 TABLET(0.5 MG) BY MOUTH TWICE DAILY AS NEEDED FOR ANXIETY   apixaban (ELIQUIS) 5 MG TABS tablet Take 1 tablet (5 mg total) by mouth 2 (two) times daily.   mirtazapine (REMERON) 30 MG tablet TAKE 1 TABLET(30 MG) BY MOUTH AT BEDTIME   Omega-3 Fatty Acids (FISH OIL) 1000 MG CAPS Take 2,000 mg by mouth at bedtime.   propranolol (INDERAL) 40 MG tablet TAKE 1 TABLET(40 MG) BY MOUTH TWICE DAILY     Allergies:    Penicillins, Cephalosporins, and Prochlorperazine edisylate   Social History   Socioeconomic History   Marital status: Widowed    Spouse name: Not on file   Number of children: 2   Years of education: college   Highest education level: Not on file  Occupational History   Occupation: Retired Therapist, sports  Tobacco Use   Smoking status: Never    Passive exposure: Never   Smokeless tobacco: Never  Vaping Use   Vaping Use: Never used  Substance and Sexual Activity   Alcohol use: Yes    Alcohol/week: 1.0 standard drink    Types: 1 Glasses of wine per week    Comment: occasional glass of wine   Drug use: No   Sexual activity: Never  Other Topics Concern   Not on file  Social History Narrative   Lives alone.   Right-handed.   Two children - 1 biological, 1 adopted.   Occasional use of caffeine.   Social Determinants of Health   Financial Resource Strain: Not on file  Food Insecurity: Not on file  Transportation Needs: Not on file  Physical Activity: Not on file  Stress: Not on file  Social Connections: Not on file     Family History: The patient's family history includes Atrial fibrillation in her father, mother, and sister; COPD in her mother; Lung cancer in her father. ROS:   Please see the history of present illness.    All other systems reviewed and are negative.  EKGs/Labs/Other Studies Reviewed:    The following studies were reviewed today:    Recent Labs: 02/10/2021: Hemoglobin 15.1; Platelets 240 05/12/2021: ALT 19; BUN 14; Creatinine, Ser 0.92; Potassium 4.2; Sodium 143  Recent Lipid Panel    Component Value Date/Time   CHOL 188 05/12/2021 0835   TRIG 247.0 (H) 05/12/2021 0835   HDL 34.80 (L) 05/12/2021 0835   CHOLHDL 5 05/12/2021 0835   VLDL 49.4 (H) 05/12/2021 0835   LDLCALC 152 (H) 05/18/2010 2005   LDLDIRECT 121.0 05/12/2021 0835    Physical Exam:    VS:  BP (!) 144/80   Pulse 97   Ht 5\' 8"  (1.727 m)   Wt 227 lb (103 kg)   SpO2 96%   BMI 34.52  kg/m     Wt Readings from Last 3 Encounters:  05/19/21 227 lb (103 kg)  05/12/21 227 lb 9.6 oz (103.2 kg)  02/15/21 229 lb (103.9 kg)     GEN:  Well nourished, well developed in no acute distress HEENT: Normal NECK: No JVD; No carotid bruits LYMPHATICS: No lymphadenopathy CARDIAC: Irregular rate and rhythm no murmurs, rubs, gallops RESPIRATORY:  Clear to auscultation without rales, wheezing or rhonchi  ABDOMEN: Soft, non-tender, non-distended MUSCULOSKELETAL:  No edema; No deformity  SKIN: Warm and dry NEUROLOGIC:  Alert and oriented x 3 PSYCHIATRIC:  Normal affect    Signed, Shirlee More, MD  05/19/2021 5:14 PM    [facility]

## 2021-06-21 ENCOUNTER — Ambulatory Visit (INDEPENDENT_AMBULATORY_CARE_PROVIDER_SITE_OTHER): Payer: Medicare Other

## 2021-06-21 VITALS — Ht 68.0 in | Wt 227.0 lb

## 2021-06-21 DIAGNOSIS — Z1231 Encounter for screening mammogram for malignant neoplasm of breast: Secondary | ICD-10-CM

## 2021-06-21 DIAGNOSIS — Z0000 Encounter for general adult medical examination without abnormal findings: Secondary | ICD-10-CM

## 2021-06-21 NOTE — Progress Notes (Addendum)
Subjective:   [name] Brown is a 81 y.o. female who presents for an Initial Medicare Annual Wellness Visit.  I connected with Lakaisha today by telephone and verified that I am speaking with the correct person using two identifiers. Location patient: home Location provider: work Persons participating in the virtual visit: patient, Marine scientist.    I discussed the limitations, risks, security and privacy concerns of performing an evaluation and management service by telephone and the availability of in person appointments. I also discussed with the patient that there may be a patient responsible charge related to this service. The patient expressed understanding and verbally consented to this telephonic visit.    Interactive audio and video telecommunications were attempted between this provider and patient, however failed, due to patient having technical difficulties OR patient did not have access to video capability.  We continued and completed visit with audio only.  Some vital signs may be absent or patient reported.   Time Spent with patient on telephone encounter: 20 minutes   Review of Systems     Cardiac Risk Factors include: advanced age (>84men, >82 women);dyslipidemia;obesity (BMI >30kg/m2)     Objective:    Today's Vitals   06/21/21 1541  Weight: 227 lb (103 kg)  Height: 5\' 8"  (1.727 m)   Body mass index is 34.52 kg/m.  Advanced Directives 06/21/2021 02/15/2021 01/24/2021 12/31/2020 05/16/2020 07/10/2016 07/07/2016  Does Patient Have a Medical Advance Directive? Yes Yes Yes Yes Yes No No  Type of Paramedic of [place];Living will Healthcare Power of [place];Living will - - -  Does patient want to make changes to medical advance directive? - - - - - - -  Copy of [place] in Chart? No - copy requested No - copy requested - - - - -  Would patient like information on creating a medical advance directive? - -  - - - Yes (Inpatient - patient requests chaplain consult to create a medical advance directive);No - Patient declined -    Current Medications (verified) Outpatient Encounter Medications as of 06/21/2021  Medication Sig   acetaminophen (TYLENOL) 500 MG tablet Take 1,500 mg by mouth daily as needed for moderate pain.   ALPRAZolam (XANAX) 0.5 MG tablet TAKE 1 TABLET(0.5 MG) BY MOUTH TWICE DAILY AS NEEDED FOR ANXIETY   mirtazapine (REMERON) 30 MG tablet TAKE 1 TABLET(30 MG) BY MOUTH AT BEDTIME   Omega-3 Fatty Acids (FISH OIL) 1000 MG CAPS Take 2,000 mg by mouth at bedtime.   propranolol (INDERAL) 40 MG tablet TAKE 1 TABLET(40 MG) BY MOUTH TWICE DAILY   apixaban (ELIQUIS) 5 MG TABS tablet Take 1 tablet (5 mg total) by mouth 2 (two) times daily.   No facility-administered encounter medications on file as of 06/21/2021.    Allergies (verified) Penicillins, Cephalosporins, and Prochlorperazine edisylate   History: Past Medical History:  Diagnosis Date   A-fib ([place])    Anxiety    Depression    Essential tremor    Hyperlipidemia    Past Surgical History:  Procedure Laterality Date   ABDOMINAL HYSTERECTOMY     APPENDECTOMY     breast biopsies Bilateral    CARDIOVERSION N/A 02/15/2021   Procedure: CARDIOVERSION;  Surgeon: [name], MD;  Location: [facility] ENDOSCOPY;  Service: Cardiovascular;  Laterality: N/A;   CYST EXCISION N/A 07/10/2016   Procedure: 3CM CYST EXCISION TRUNK;  Surgeon: Aviva Signs, MD;  Location: [facility] ORS;  Service: General;  Laterality:  N/A;   FL INJ LEFT KNEE CT ARTHROGRAM ([facility] HX)     TONSILLECTOMY     Family History  Problem Relation Age of Onset   Atrial fibrillation Mother    COPD Mother    Atrial fibrillation Father    Lung cancer Father    Atrial fibrillation Sister    Social History   Socioeconomic History   Marital status: Widowed    Spouse name: Not on file   Number of children: 2   Years of education: college   Highest education level: Not  on file  Occupational History   Occupation: Retired Therapist, sports  Tobacco Use   Smoking status: Never    Passive exposure: Never   Smokeless tobacco: Never  Vaping Use   Vaping Use: Never used  Substance and Sexual Activity   Alcohol use: Yes    Alcohol/week: 1.0 standard drink    Types: 1 Glasses of wine per week    Comment: occasional glass of wine   Drug use: No   Sexual activity: Never  Other Topics Concern   Not on file  Social History Narrative   Lives alone.   Right-handed.   Two children - 1 biological, 1 adopted.   Occasional use of caffeine.   Social Determinants of Health   Financial Resource Strain: Low Risk    Difficulty of Paying Living Expenses: Not hard at all  Food Insecurity: No Food Insecurity   Worried About Charity fundraiser in the Last Year: Never true   [place] in the Last Year: Never true  Transportation Needs: No Transportation Needs   Lack of Transportation (Medical): No   Lack of Transportation (Non-Medical): No  Physical Activity: Inactive   Days of Exercise per Week: 0 days   Minutes of Exercise per Session: 0 min  Stress: Stress Concern Present   Feeling of Stress : To some extent  Social Connections: Moderately Isolated   Frequency of Communication with Friends and Family: More than three times a week   Frequency of Social Gatherings with Friends and Family: More than three times a week   Attends Religious Services: More than 4 times per year   Active Member of [employer] or Organizations: No   Attends Archivist Meetings: Never   Marital Status: Widowed    Tobacco Counseling Counseling given: Not Answered   Clinical Intake:  Pre-visit preparation completed: Yes  Pain : No/denies pain     BMI - recorded: 34.52 Nutritional Status: BMI > 30  Obese Nutritional Risks: None Diabetes: No  How often do you need to have someone help you when you read instructions, pamphlets, or other written materials from your doctor or  pharmacy?: 1 - Never  Diabetic?No  Interpreter Needed?: No  Information entered by :: Caroleen Hamman LPN   Activities of Daily Living In your present state of health, do you have any difficulty performing the following activities: 06/21/2021 12/21/2020  Hearing? N N  Vision? N N  Difficulty concentrating or making decisions? N N  Walking or climbing stairs? N N  Dressing or bathing? N N  Doing errands, shopping? N N  Preparing Food and eating ? N -  Using the Toilet? N -  In the past six months, have you accidently leaked urine? Y -  Do you have problems with loss of bowel control? N -  Managing your Medications? N -  Managing your Finances? N -  Housekeeping or managing your Housekeeping? N -  Some recent data might be hidden    Patient Care Team: [name], Iris Pert as PCP - General (Internal Medicine)  Indicate any recent Medical Services you may have received from other than [facility] providers in the past year (date may be approximate).     Assessment:   This is a routine wellness examination for [name] Brown.  Hearing/Vision screen Hearing Screening - Comments:: No issues Vision Screening - Comments:: Last eye exam-2 years ago  Dietary issues and exercise activities discussed: Current Exercise Habits: The patient does not participate in regular exercise at present, Exercise limited by: None identified   Goals Addressed             This Visit's Progress    Patient Stated       Maintain current health       Depression Screen PHQ 2/9 Scores 06/21/2021  PHQ - 2 Score 0    Fall Risk Fall Risk  06/21/2021  Falls in the past year? 0  Number falls in past yr: 0  Injury with Fall? 0  Follow up Falls prevention discussed    FALL RISK PREVENTION PERTAINING TO THE HOME:  Any stairs in or around the home? No  Home free of loose throw rugs in walkways, pet beds, electrical cords, etc? Yes  Adequate lighting in your home to reduce risk of falls? Yes   ASSISTIVE  DEVICES UTILIZED TO PREVENT FALLS:  Life alert? No  Use of a cane, walker or w/c? No  Grab bars in the bathroom? No  Shower chair or bench in shower? No  Elevated toilet seat or a handicapped toilet? No   TIMED UP AND GO:  Was the test performed? No . Phone visit   Cognitive Function:Normal cognitive status assessed by his Nurse Health Advisor. No abnormalities found.          Immunizations Immunization History  Administered Date(s) Administered   Influenza Whole 03/22/2006, 03/19/2007, 03/17/2008, 03/30/2009, 05/18/2010   Influenza, High Dose Seasonal PF 04/18/2018   Moderna Sars-Covid-2 Vaccination 01/10/2020, 02/07/2020   Pneumococcal Polysaccharide-23 06/30/2005   Td 09/26/2005    TDAP status: Due, Education has been provided regarding the importance of this vaccine. Advised may receive this vaccine at local pharmacy or Health Dept. Aware to provide a copy of the vaccination record if obtained from local pharmacy or Health Dept. Verbalized acceptance and understanding.  Flu Vaccine status: Due, Education has been provided regarding the importance of this vaccine. Advised may receive this vaccine at local pharmacy or Health Dept. Aware to provide a copy of the vaccination record if obtained from local pharmacy or Health Dept. Verbalized acceptance and understanding.  Pneumococcal vaccine status: Up to date per patient. Specific date unknown.  Covid-19 vaccine status: Declined, Education has been provided regarding the importance of this vaccine but patient still declined. Advised may receive this vaccine at local pharmacy or Health Dept.or vaccine clinic. Aware to provide a copy of the vaccination record if obtained from local pharmacy or Health Dept. Verbalized acceptance and understanding.  Qualifies for Shingles Vaccine? Yes   Zostavax completed No   Shingrix Completed?: No.    Education has been provided regarding the importance of this vaccine. Patient has been advised  to call insurance company to determine out of pocket expense if they have not yet received this vaccine. Advised may also receive vaccine at local pharmacy or Health Dept. Verbalized acceptance and understanding.  Screening Tests Health Maintenance  Topic Date Due   FOOT EXAM  Never  done   OPHTHALMOLOGY EXAM  Never done   URINE MICROALBUMIN  Never done   Hepatitis C Screening  Never done   Zoster Vaccines- Shingrix (1 of 2) Never done   Pneumonia Vaccine 44+ Years old (2 - PCV) 06/30/2006   TETANUS/TDAP  09/27/2015   COVID-19 Vaccine (3 - Moderna risk series) 03/06/2020   INFLUENZA VACCINE  09/09/2021 (Originally 01/10/2021)   HEMOGLOBIN A1C  11/10/2021   DEXA SCAN  Completed   HPV VACCINES  Aged Out   COLONOSCOPY (Pts 45-4yrs Insurance coverage will need to be confirmed)  Discontinued    Health Maintenance  Health Maintenance Due  Topic Date Due   FOOT EXAM  Never done   OPHTHALMOLOGY EXAM  Never done   URINE MICROALBUMIN  Never done   Hepatitis C Screening  Never done   Zoster Vaccines- Shingrix (1 of 2) Never done   Pneumonia Vaccine 52+ Years old (2 - PCV) 06/30/2006   TETANUS/TDAP  09/27/2015   COVID-19 Vaccine (3 - Moderna risk series) 03/06/2020    Colorectal cancer screening: No longer required.   Mammogram status: Ordered today. Pt provided with contact info and advised to call to schedule appt.   Bone Density status: Declined  Lung Cancer Screening: (Low Dose CT Chest recommended if Age 35-80 years, 30 pack-year currently smoking OR have quit w/in 15years.) does not qualify.     Additional Screening:  Hepatitis C Screening: does not qualify  Vision Screening: Recommended annual ophthalmology exams for early detection of glaucoma and other disorders of the eye. Is the patient up to date with their annual eye exam?  No  Who is the provider or what is the name of the office in which the patient attends annual eye exams? Unsure-patient plans to make an  appt   Dental Screening: Recommended annual dental exams for proper oral hygiene  Community Resource Referral / Chronic Care Management: CRR required this visit?  No   CCM required this visit?  No      Plan:     I have personally reviewed and noted the following in the patients chart:   Medical and social history Use of alcohol, tobacco or illicit drugs  Current medications and supplements including opioid prescriptions. Patient is not currently taking opioid prescriptions. Functional ability and status Nutritional status Physical activity Advanced directives List of other physicians Hospitalizations, surgeries, and ER visits in previous 12 months Vitals Screenings to include cognitive, depression, and falls Referrals and appointments  In addition, I have reviewed and discussed with patient certain preventive protocols, quality metrics, and best practice recommendations. A written personalized care plan for preventive services as well as general preventive health recommendations were provided to patient.   Due to this being a telephonic visit, the after visit summary with patients personalized plan was offered to patient via mail or my-chart.Patient would like to access on my-chart.   Marta Antu, LPN   01/27/5630  Nurse Health Advisor  Nurse Notes: None  Review and Agree with assessment & plan of LPN   Mackie Pai, PA-C

## 2021-06-21 NOTE — Patient Instructions (Signed)
[name] Brown , Thank you for taking time to complete your Medicare Wellness Visit. I appreciate your ongoing commitment to your health goals. Please review the following plan we discussed and let me know if I can assist you in the future.   Screening recommendations/referrals: Colonoscopy: No longer required Mammogram: Ordered today. Someone will call you to schedule. Bone Density: Declined today. Recommended yearly ophthalmology/optometry visit for glaucoma screening and checkup Recommended yearly dental visit for hygiene and checkup  Vaccinations: Influenza vaccine: Due-May obtain vaccine at our office or your local pharmacy. Pneumococcal vaccine: Up to date Tdap vaccine: Due-May obtain vaccine at your local pharmacy. Shingles vaccine: Vaccine available at the pharmacy   Covid-19:Declined booster.  Advanced directives: Please bring a copy of Living Will and/or Healthcare Power of Attorney for your chart.   Conditions/risks identified: See problem list  Next appointment: Follow up in one year for your annual wellness visit    Preventive Care 65 Years and Older, Female Preventive care refers to lifestyle choices and visits with your health care provider that can promote health and wellness. What does preventive care include? A yearly physical exam. This is also called an annual well check. Dental exams once or twice a year. Routine eye exams. Ask your health care provider how often you should have your eyes checked. Personal lifestyle choices, including: Daily care of your teeth and gums. Regular physical activity. Eating a healthy diet. Avoiding tobacco and drug use. Limiting alcohol use. Practicing safe sex. Taking low-dose aspirin every day. Taking vitamin and mineral supplements as recommended by your health care provider. What happens during an annual well check? The services and screenings done by your health care provider during your annual well check will depend on  your age, overall health, lifestyle risk factors, and family history of disease. Counseling  Your health care provider may ask you questions about your: Alcohol use. Tobacco use. Drug use. Emotional well-being. Home and relationship well-being. Sexual activity. Eating habits. History of falls. Memory and ability to understand (cognition). Work and work Statistician. Reproductive health. Screening  You may have the following tests or measurements: Height, weight, and BMI. Blood pressure. Lipid and cholesterol levels. These may be checked every 5 years, or more frequently if you are over 60 years old. Skin check. Lung cancer screening. You may have this screening every year starting at age 22 if you have a 30-pack-year history of smoking and currently smoke or have quit within the past 15 years. Fecal occult blood test (FOBT) of the stool. You may have this test every year starting at age 65. Flexible sigmoidoscopy or colonoscopy. You may have a sigmoidoscopy every 5 years or a colonoscopy every 10 years starting at age 57. Hepatitis C blood test. Hepatitis B blood test. Sexually transmitted disease (STD) testing. Diabetes screening. This is done by checking your blood sugar (glucose) after you have not eaten for a while (fasting). You may have this done every 1-3 years. Bone density scan. This is done to screen for osteoporosis. You may have this done starting at age 15. Mammogram. This may be done every 1-2 years. Talk to your health care provider about how often you should have regular mammograms. Talk with your health care provider about your test results, treatment options, and if necessary, the need for more tests. Vaccines  Your health care provider may recommend certain vaccines, such as: Influenza vaccine. This is recommended every year. Tetanus, diphtheria, and acellular pertussis (Tdap, Td) vaccine. You may need a Td  booster every 10 years. Zoster vaccine. You may need this  after age 61. Pneumococcal 13-valent conjugate (PCV13) vaccine. One dose is recommended after age 82. Pneumococcal polysaccharide (PPSV23) vaccine. One dose is recommended after age 76. Talk to your health care provider about which screenings and vaccines you need and how often you need them. This information is not intended to replace advice given to you by your health care provider. Make sure you discuss any questions you have with your health care provider. Document Released: 06/25/2015 Document Revised: 02/16/2016 Document Reviewed: 03/30/2015 Elsevier Interactive Patient Education  2017 [place] Prevention in the Home Falls can cause injuries. They can happen to people of all ages. There are many things you can do to make your home safe and to help prevent falls. What can I do on the outside of my home? Regularly fix the edges of walkways and driveways and fix any cracks. Remove anything that might make you trip as you walk through a door, such as a raised step or threshold. Trim any bushes or trees on the path to your home. Use bright outdoor lighting. Clear any walking paths of anything that might make someone trip, such as rocks or tools. Regularly check to see if handrails are loose or broken. Make sure that both sides of any steps have handrails. Any raised decks and porches should have guardrails on the edges. Have any leaves, snow, or ice cleared regularly. Use sand or salt on walking paths during winter. Clean up any spills in your garage right away. This includes oil or grease spills. What can I do in the bathroom? Use night lights. Install grab bars by the toilet and in the tub and shower. Do not use towel bars as grab bars. Use non-skid mats or decals in the tub or shower. If you need to sit down in the shower, use a plastic, non-slip stool. Keep the floor dry. Clean up any water that spills on the floor as soon as it happens. Remove soap buildup in the tub or  shower regularly. Attach bath mats securely with double-sided non-slip rug tape. Do not have throw rugs and other things on the floor that can make you trip. What can I do in the bedroom? Use night lights. Make sure that you have a light by your bed that is easy to reach. Do not use any sheets or blankets that are too big for your bed. They should not hang down onto the floor. Have a firm chair that has side arms. You can use this for support while you get dressed. Do not have throw rugs and other things on the floor that can make you trip. What can I do in the kitchen? Clean up any spills right away. Avoid walking on wet floors. Keep items that you use a lot in easy-to-reach places. If you need to reach something above you, use a strong step stool that has a grab bar. Keep electrical cords out of the way. Do not use floor polish or wax that makes floors slippery. If you must use wax, use non-skid floor wax. Do not have throw rugs and other things on the floor that can make you trip. What can I do with my stairs? Do not leave any items on the stairs. Make sure that there are handrails on both sides of the stairs and use them. Fix handrails that are broken or loose. Make sure that handrails are as long as the stairways. Check any carpeting  to make sure that it is firmly attached to the stairs. Fix any carpet that is loose or worn. Avoid having throw rugs at the top or bottom of the stairs. If you do have throw rugs, attach them to the floor with carpet tape. Make sure that you have a light switch at the top of the stairs and the bottom of the stairs. If you do not have them, ask someone to add them for you. What else can I do to help prevent falls? Wear shoes that: Do not have high heels. Have rubber bottoms. Are comfortable and fit you well. Are closed at the toe. Do not wear sandals. If you use a stepladder: Make sure that it is fully opened. Do not climb a closed stepladder. Make  sure that both sides of the stepladder are locked into place. Ask someone to hold it for you, if possible. Clearly mark and make sure that you can see: Any grab bars or handrails. First and last steps. Where the edge of each step is. Use tools that help you move around (mobility aids) if they are needed. These include: Canes. Walkers. Scooters. Crutches. Turn on the lights when you go into a dark area. Replace any light bulbs as soon as they burn out. Set up your furniture so you have a clear path. Avoid moving your furniture around. If any of your floors are uneven, fix them. If there are any pets around you, be aware of where they are. Review your medicines with your doctor. Some medicines can make you feel dizzy. This can increase your chance of falling. Ask your doctor what other things that you can do to help prevent falls. This information is not intended to replace advice given to you by your health care provider. Make sure you discuss any questions you have with your health care provider. Document Released: 03/25/2009 Document Revised: 11/04/2015 Document Reviewed: 07/03/2014 Elsevier Interactive Patient Education  2017 [employer].

## 2021-06-24 ENCOUNTER — Other Ambulatory Visit: Payer: Self-pay | Admitting: Medical

## 2021-07-26 ENCOUNTER — Telehealth (HOSPITAL_BASED_OUTPATIENT_CLINIC_OR_DEPARTMENT_OTHER): Payer: Self-pay

## 2021-08-10 ENCOUNTER — Other Ambulatory Visit: Payer: Self-pay | Admitting: Medical

## 2021-08-10 ENCOUNTER — Ambulatory Visit (INDEPENDENT_AMBULATORY_CARE_PROVIDER_SITE_OTHER): Payer: Medicare Other | Admitting: Medical

## 2021-08-10 VITALS — BP 129/70 | HR 65 | Temp 98.6°F | Resp 18 | Ht 68.0 in | Wt 225.8 lb

## 2021-08-10 DIAGNOSIS — J029 Acute pharyngitis, unspecified: Secondary | ICD-10-CM | POA: Diagnosis not present

## 2021-08-10 DIAGNOSIS — F411 Generalized anxiety disorder: Secondary | ICD-10-CM

## 2021-08-10 DIAGNOSIS — R0981 Nasal congestion: Secondary | ICD-10-CM | POA: Diagnosis not present

## 2021-08-10 DIAGNOSIS — G4700 Insomnia, unspecified: Secondary | ICD-10-CM

## 2021-08-10 DIAGNOSIS — Z1283 Encounter for screening for malignant neoplasm of skin: Secondary | ICD-10-CM

## 2021-08-10 DIAGNOSIS — R051 Acute cough: Secondary | ICD-10-CM

## 2021-08-10 DIAGNOSIS — Z79899 Other long term (current) drug therapy: Secondary | ICD-10-CM

## 2021-08-10 MED ORDER — AZITHROMYCIN 250 MG PO TABS: ORAL_TABLET | ORAL | 0 refills | Status: AC

## 2021-08-10 MED ORDER — MIRTAZAPINE 30 MG PO TABS: ORAL_TABLET | ORAL | 2 refills | Status: DC

## 2021-08-10 MED ORDER — FLUTICASONE PROPIONATE 50 MCG/ACT NA SUSP: 2.0000 | Freq: Every day | NASAL | 1 refills | Status: DC

## 2021-08-10 MED ORDER — BENZONATATE 100 MG PO CAPS: 100.0000 mg | ORAL_CAPSULE | Freq: Three times a day (TID) | ORAL | 0 refills | Status: DC | PRN

## 2021-08-10 NOTE — Addendum Note (Signed)
Addended by: Jeronimo Greaves on: 08/10/2021 08:27 AM ? ? Modules accepted: Orders ? ?

## 2021-08-10 NOTE — Progress Notes (Signed)
? ?Subjective:  ? ? Patient ID: [name] Brown, female    DOB: [birth year], 81 y.o.   MRN: [id number] ? ?HPI ?Pt states had raised area on rt upper breast area for years. She states randomly area fell off and bled. That happened 2 weeks ago.  ? ?Insomnia- remeron helps her sleep. Works well. ? ?Cough, congestion, runny nose and st for 10 days. All last week watched grandchildren who had strep. ? ?Atrial fibrillation. Rate controlled and on eliquis. ? ? ? ? ?Review of Systems  ?Constitutional:  Negative for chills, fatigue and fever.  ?HENT:  Positive for congestion and sore throat.   ?Respiratory:  Positive for cough. Negative for shortness of breath and wheezing.   ?Cardiovascular:  Negative for chest pain and palpitations.  ?Gastrointestinal:  Negative for abdominal pain.  ?Genitourinary:  Negative for dysuria.  ?Musculoskeletal:  Negative for back pain and myalgias.  ?Neurological:  Negative for dizziness and headaches.  ?Hematological:  Negative for adenopathy. Does not bruise/bleed easily.  ?Psychiatric/Behavioral:  Positive for sleep disturbance. Negative for behavioral problems, dysphoric mood and suicidal ideas. The patient is nervous/anxious.   ? ?Past Medical History:  ?Diagnosis Date  ? A-fib ([place])   ? Anxiety   ? Depression   ? Essential tremor   ? Hyperlipidemia   ? ?  ?Social History  ? ?Socioeconomic History  ? Marital status: Widowed  ?  Spouse name: Not on file  ? Number of children: 2  ? Years of education: college  ? Highest education level: Not on file  ?Occupational History  ? Occupation: Retired Therapist, sports  ?Tobacco Use  ? Smoking status: Never  ?  Passive exposure: Never  ? Smokeless tobacco: Never  ?Vaping Use  ? Vaping Use: Never used  ?Substance and Sexual Activity  ? Alcohol use: Yes  ?  Alcohol/week: 1.0 standard drink  ?  Types: 1 Glasses of wine per week  ?  Comment: occasional glass of wine  ? Drug use: No  ? Sexual activity: Never  ?Other Topics Concern  ? Not on file  ?Social History  Narrative  ? Lives alone.  ? Right-handed.  ? Two children - 1 biological, 1 adopted.  ? Occasional use of caffeine.  ? ?Social Determinants of Health  ? ?Financial Resource Strain: Low Risk   ? Difficulty of Paying Living Expenses: Not hard at all  ?Food Insecurity: No Food Insecurity  ? Worried About Charity fundraiser in the Last Year: Never true  ? Ran Out of Food in the Last Year: Never true  ?Transportation Needs: No Transportation Needs  ? Lack of Transportation (Medical): No  ? Lack of Transportation (Non-Medical): No  ?Physical Activity: Inactive  ? Days of Exercise per Week: 0 days  ? Minutes of Exercise per Session: 0 min  ?Stress: Stress Concern Present  ? Feeling of Stress : To some extent  ?Social Connections: Moderately Isolated  ? Frequency of Communication with Friends and Family: More than three times a week  ? Frequency of Social Gatherings with Friends and Family: More than three times a week  ? Attends Religious Services: More than 4 times per year  ? Active Member of Clubs or Organizations: No  ? Attends Archivist Meetings: Never  ? Marital Status: Widowed  ?Intimate Partner Violence: Not At Risk  ? Fear of Current or Ex-Partner: No  ? Emotionally Abused: No  ? Physically Abused: No  ? Sexually Abused: No  ? ? ?Past  Surgical History:  ?Procedure Laterality Date  ? ABDOMINAL HYSTERECTOMY    ? APPENDECTOMY    ? breast biopsies Bilateral   ? CARDIOVERSION N/A 02/15/2021  ? Procedure: CARDIOVERSION;  Surgeon: [name], MD;  Location: [facility];  Service: Cardiovascular;  Laterality: N/A;  ? CYST EXCISION N/A 07/10/2016  ? Procedure: 3CM CYST EXCISION TRUNK;  Surgeon: Aviva Signs, MD;  Location: [facility];  Service: General;  Laterality: N/A;  ? FL INJ LEFT KNEE CT ARTHROGRAM ([facility])    ? TONSILLECTOMY    ? ? ?Family History  ?Problem Relation Age of Onset  ? Atrial fibrillation Mother   ? COPD Mother   ? Atrial fibrillation Father   ? Lung cancer Father   ? Atrial  fibrillation Sister   ? ? ?Allergies  ?Allergen Reactions  ? Penicillins Anaphylaxis  ?  Has patient had a PCN reaction causing immediate rash, facial/tongue/throat swelling, SOB or lightheadedness with hypotension: Yes ?Has patient had a PCN reaction causing severe rash involving mucus membranes or skin necrosis: No ?Has patient had a PCN reaction that required hospitalization Yes ?Has patient had a PCN reaction occurring within the last 10 years: Yes ?If all of the above answers are "NO", then may proceed with Cephalosporin use. ?  ? Cephalosporins Other (See Comments)  ?  Due to allergic reaction to penicillin  ? Prochlorperazine Edisylate   ?  REACTION: draws mouth to side  ? ? ?Current Outpatient Medications on File Prior to Visit  ?Medication Sig Dispense Refill  ? acetaminophen (TYLENOL) 500 MG tablet Take 1,500 mg by mouth daily as needed for moderate pain.    ? ALPRAZolam (XANAX) 0.5 MG tablet TAKE 1 TABLET(0.5 MG) BY MOUTH TWICE DAILY AS NEEDED FOR ANXIETY 60 tablet 2  ? Omega-3 Fatty Acids (FISH OIL) 1000 MG CAPS Take 2,000 mg by mouth at bedtime.    ? propranolol (INDERAL) 40 MG tablet TAKE 1 TABLET(40 MG) BY MOUTH TWICE DAILY 180 tablet 0  ? apixaban (ELIQUIS) 5 MG TABS tablet Take 1 tablet (5 mg total) by mouth 2 (two) times daily. 60 tablet 3  ? ?No current facility-administered medications on file prior to visit.  ? ? ?BP 129/70   Pulse 65   Temp 98.6 ?F (37 ?C)   Resp 18   Ht 5\' 8"  (1.727 m)   Wt 225 lb 12.8 oz (102.4 kg)   SpO2 97%   BMI 34.33 kg/m?  ?  ?   ?Objective:  ? Physical Exam ? ?General- No acute distress. Pleasant patient. ?Neck- Full range of motion, no jvd ?Lungs- Clear, even and unlabored. ?Heart- regular rate and rhythm. ?Neurologic- CNII- XII grossly intact.  ?Heent- faint sinus pressure. Mild bggy turbinates. Faint red pharynx. ? ?   ?Assessment & Plan:  ? ?Patient Instructions  ?Pharyngitis- strep vs viral. Recent close contact to various granchildren with strep. So decided  to rx azithromycin antibiotic. ? ?For nasal congestion and cough rx flonase and benzonatate. Spring approaching/here so congestion may be allergy related. ? ?Insomnia- rx remeron.  ? ?Anxiety- controlled with xanax.  ? ?Referred for skin cancer screening. Rt upper chest lesion you report got knocked off may have be sebrorrheic keratosis. ? ?High cholesterol continue statin. ? ?Atrial fibrillation- rate controlled and on eliquis. ? ?Follow up in 3 months or sooner if needed.  ? ?Mackie Pai, PA-C  ?

## 2021-08-10 NOTE — Patient Instructions (Addendum)
Pharyngitis- strep vs viral. Recent close contact to various granchildren with strep. So decided to rx azithromycin antibiotic. ? ?For nasal congestion and cough rx flonase and benzonatate. Spring approaching/here so congestion may be allergy related. ? ?Insomnia- rx remeron.  ? ?Anxiety- controlled with xanax.  ? ?Referred for skin cancer screening. Rt upper chest lesion you report got knocked off may have be sebrorrheic keratosis. ? ?High cholesterol continue statin. ? ?Atrial fibrillation- rate controlled and on eliquis. ? ?Follow up in 3 months or sooner if needed. ?

## 2021-08-12 ENCOUNTER — Telehealth: Payer: Self-pay | Admitting: Medical

## 2021-08-12 ENCOUNTER — Other Ambulatory Visit: Payer: Self-pay | Admitting: Medical

## 2021-08-12 LAB — DM TEMPLATE

## 2021-08-12 NOTE — Telephone Encounter (Signed)
Refill request already sent to PCP via rx request basket ?

## 2021-08-12 NOTE — Telephone Encounter (Addendum)
Requesting: xanax ?Contract:08/10/21 ?UDS:08/10/21 ?Last Visit:08/10/21 ?Next Visit:11/15/21 ?Last Refill:12/22 ? ?Please Advise  ? ?Rx refill sent to pharmacy. ? ?Mackie Pai, PA-C  ?

## 2021-08-12 NOTE — Telephone Encounter (Signed)
Medication:  ?ALPRAZolam (XANAX) 0.5 MG tablet [[id number]]  ?  ? ?Has the patient contacted their pharmacy? No. ?(If no, request that the patient contact the pharmacy for the refill.) ?(If yes, when and what did the pharmacy advise?) ? ?  ? ?Preferred Pharmacy (with phone number or street name):  ?[id number] - [place], [address] AT [address]  ?[place], [place] [address]  ?Phone:  [phone]  Fax:  [phone]  ?  ? ?Agent: Please be advised that RX refills may take up to 3 business days. We ask that you follow-up with your pharmacy.  ?

## 2021-08-13 LAB — DRUG MONITORING PANEL 376104, URINE

## 2021-08-13 LAB — DM TEMPLATE

## 2021-08-31 ENCOUNTER — Other Ambulatory Visit: Payer: Self-pay | Admitting: Medical

## 2021-08-31 NOTE — Telephone Encounter (Addendum)
Requesting: alprazolam 0.'5mg'$   ?Contract: 08/10/2021 ?UDS: 08/10/2021 ?Last Visit: 08/10/2021 ?Next Visit: 11/15/2021 ?Last Refill: 08/12/2021 #60 and 0RF ? ? ?Please Advise ? ? ?I sent in rx. But states fill when due/not early. ? ?If you would ask pt why asking for such early refill. Usually recommend ask for refill 3 days out. On this refill gave one  ? ?Thanks, ? ?

## 2021-09-28 ENCOUNTER — Other Ambulatory Visit: Payer: Self-pay | Admitting: Medical

## 2021-10-14 ENCOUNTER — Other Ambulatory Visit: Payer: Self-pay | Admitting: Medical

## 2021-10-14 MED ORDER — MIRTAZAPINE 30 MG PO TABS: ORAL_TABLET | ORAL | 2 refills | Status: DC

## 2021-10-14 NOTE — Addendum Note (Signed)
Addended by: Anabel Halon on: 10/14/2021 12:34 PM ? ? Modules accepted: Orders ? ?

## 2021-11-15 ENCOUNTER — Ambulatory Visit (INDEPENDENT_AMBULATORY_CARE_PROVIDER_SITE_OTHER): Payer: Medicare Other | Admitting: Medical

## 2021-11-15 VITALS — BP 120/70 | HR 93 | Resp 18 | Ht 68.0 in | Wt 232.0 lb

## 2021-11-15 DIAGNOSIS — G4700 Insomnia, unspecified: Secondary | ICD-10-CM

## 2021-11-15 DIAGNOSIS — E785 Hyperlipidemia, unspecified: Secondary | ICD-10-CM

## 2021-11-15 DIAGNOSIS — I10 Essential (primary) hypertension: Secondary | ICD-10-CM

## 2021-11-15 DIAGNOSIS — R5383 Other fatigue: Secondary | ICD-10-CM | POA: Diagnosis not present

## 2021-11-15 DIAGNOSIS — I4819 Other persistent atrial fibrillation: Secondary | ICD-10-CM | POA: Diagnosis not present

## 2021-11-15 LAB — COMPREHENSIVE METABOLIC PANEL
ALT: 16 U/L (ref 0–35)
AST: 17 U/L (ref 0–37)
Albumin: 4 g/dL (ref 3.5–5.2)
Alkaline Phosphatase: 65 U/L (ref 39–117)
BUN: 23 mg/dL (ref 6–23)
CO2: 25 mEq/L (ref 19–32)
Calcium: 9.2 mg/dL (ref 8.4–10.5)
Chloride: 107 mEq/L (ref 96–112)
Creatinine, Ser: 0.9 mg/dL (ref 0.40–1.20)
GFR: 61.08 mL/min (ref >60.00)
Glucose, Bld: 103 mg/dL — ABNORMAL HIGH (ref 70–99)
Potassium: 4.4 mEq/L (ref 3.5–5.1)
Sodium: 141 mEq/L (ref 135–145)
Total Bilirubin: 0.6 mg/dL (ref 0.2–1.2)
Total Protein: 6.9 g/dL (ref 6.0–8.3)

## 2021-11-15 LAB — VITAMIN B12: Vitamin B-12: 354 pg/mL (ref 211–911)

## 2021-11-15 LAB — TSH: TSH: 2.02 u[IU]/mL (ref 0.35–5.50)

## 2021-11-15 LAB — CBC WITH DIFFERENTIAL/PLATELET
Basophils Absolute: 0 10*3/uL (ref 0.0–0.1)
Basophils Relative: 1 % (ref 0.0–3.0)
Eosinophils Absolute: 0.1 10*3/uL (ref 0.0–0.7)
Eosinophils Relative: 2.3 % (ref 0.0–5.0)
HCT: 43.7 % (ref 36.0–46.0)
Hemoglobin: 14.4 g/dL (ref 12.0–15.0)
Lymphocytes Relative: 31 % (ref 12.0–46.0)
Lymphs Abs: 1.5 10*3/uL (ref 0.7–4.0)
MCHC: 33.1 g/dL (ref 30.0–36.0)
MCV: 90.3 fl (ref 78.0–100.0)
Monocytes Absolute: 0.3 10*3/uL (ref 0.1–1.0)
Monocytes Relative: 6.6 % (ref 3.0–12.0)
Neutro Abs: 2.9 10*3/uL (ref 1.4–7.7)
Neutrophils Relative %: 59.1 % (ref 43.0–77.0)
Platelets: 230 10*3/uL (ref 150.0–400.0)
RBC: 4.84 Mil/uL (ref 3.87–5.11)
RDW: 14 % (ref 11.5–15.5)
WBC: 4.9 10*3/uL (ref 4.0–10.5)

## 2021-11-15 LAB — T4, FREE: Free T4: 0.83 ng/dL (ref 0.60–1.60)

## 2021-11-15 NOTE — Progress Notes (Signed)
Subjective:    Patient ID: [name] Brown, female    DOB: 1940-07-01, 81 y.o.   MRN: [id number]  HPI Pt in for follow up.  Pt has anxiety. Pt states very stressed with son having multiple strokes in January and also amputee due to diabetes. Pt is at home. She is worried about him. 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 Pt on xanax. She is up to date on contract and uds.  She states she is very tired with above.   Pt last 4 bp readings over past month 121/68, 134/80, 146/96 and 140/85. Pulse reading 78. 80, 98 and 92. Pt states occasionall when walking pulse will go to 118 but this is rare. Over past 2 months at rest her pulse levels were less than 100.  Pt has atrial fibrillation. She sees Dr. Bettina Gavia. Last visit 05-19-2021 below in "  In past failed cardioversion.   ASSESSMENT:     1. Persistent atrial fibrillation ([place])   2. Chronic anticoagulation   3. Mixed hyperlipidemia     PLAN:     In order of problems listed above:   Unfortunately we are unable to restore sinus rhythm she is minimally symptomatic from atrial fibrillation and will continue with rate control and anticoagulation. Continue her current anticoagulant Continue fish oil supplement Continue to trend blood pressures she was in a terrible hurry getting here today coming from childcare and subsequent repeat blood pressure was mildly elevated  Echo done in 01-2021 showed good EF     Review of Systems  Constitutional:  Positive for fatigue. Negative for chills and fever.  HENT:  Negative for congestion.   Respiratory:  Negative for cough, choking, chest tightness and wheezing.   Cardiovascular:  Negative for chest pain and palpitations.       See hpi.  Gastrointestinal:  Negative for abdominal pain.  Genitourinary:  Negative for dysuria.  Musculoskeletal:  Negative for back pain and myalgias.  Skin:  Negative for rash.  Neurological:  Negative for dizziness, speech difficulty, numbness and headaches.   Hematological:  Negative for adenopathy. Does not bruise/bleed easily.  Psychiatric/Behavioral:  Negative for behavioral problems, confusion and sleep disturbance. The patient is not nervous/anxious.     Past Medical History:  Diagnosis Date   A-fib [facility])    Anxiety    Depression    Essential tremor    Hyperlipidemia      Social History   Socioeconomic History   Marital status: Widowed    Spouse name: Not on file   Number of children: 2   Years of education: college   Highest education level: Not on file  Occupational History   Occupation: Retired Therapist, sports  Tobacco Use   Smoking status: Never    Passive exposure: Never   Smokeless tobacco: Never  Vaping Use   Vaping Use: Never used  Substance and Sexual Activity   Alcohol use: Yes    Alcohol/week: 1.0 standard drink    Types: 1 Glasses of wine per week    Comment: occasional glass of wine   Drug use: No   Sexual activity: Never  Other Topics Concern   Not on file  Social History Narrative   Lives alone.   Right-handed.   Two children - 1 biological, 1 adopted.   Occasional use of caffeine.   Social Determinants of Health   Financial Resource Strain: Low Risk    Difficulty of Paying Living Expenses: Not hard at all  Food Insecurity: No Food  Insecurity   Worried About Charity fundraiser in the Last Year: Never true   [place] in the Last Year: Never true  Transportation Needs: No Transportation Needs   Lack of Transportation (Medical): No   Lack of Transportation (Non-Medical): No  Physical Activity: Inactive   Days of Exercise per Week: 0 days   Minutes of Exercise per Session: 0 min  Stress: Stress Concern Present   Feeling of Stress : To some extent  Social Connections: Moderately Isolated   Frequency of Communication with Friends and Family: More than three times a week   Frequency of Social Gatherings with Friends and Family: More than three times a week   Attends Religious Services: More than 4  times per year   Active Member of [employer] or Organizations: No   Attends Archivist Meetings: Never   Marital Status: Widowed  Human resources officer Violence: Not At Risk   Fear of Current or Ex-Partner: No   Emotionally Abused: No   Physically Abused: No   Sexually Abused: No    Past Surgical History:  Procedure Laterality Date   ABDOMINAL HYSTERECTOMY     APPENDECTOMY     breast biopsies Bilateral    CARDIOVERSION N/A 02/15/2021   Procedure: CARDIOVERSION;  Surgeon: [name], MD;  Location: [facility];  Service: Cardiovascular;  Laterality: N/A;   CYST EXCISION N/A 07/10/2016   Procedure: 3CM CYST EXCISION TRUNK;  Surgeon: Aviva Signs, MD;  Location: [facility];  Service: General;  Laterality: N/A;   FL INJ LEFT KNEE CT ARTHROGRAM ([place] HX)     TONSILLECTOMY      Family History  Problem Relation Age of Onset   Atrial fibrillation Mother    COPD Mother    Atrial fibrillation Father    Lung cancer Father    Atrial fibrillation Sister     Allergies  Allergen Reactions   Penicillins Anaphylaxis    Has patient had a PCN reaction causing immediate rash, facial/tongue/throat swelling, SOB or lightheadedness with hypotension: Yes Has patient had a PCN reaction causing severe rash involving mucus membranes or skin necrosis: No Has patient had a PCN reaction that required hospitalization Yes Has patient had a PCN reaction occurring within the last 10 years: Yes If all of the above answers are "NO", then may proceed with Cephalosporin use.    Cephalosporins Other (See Comments)    Due to allergic reaction to penicillin   Prochlorperazine Edisylate     REACTION: draws mouth to side    Current Outpatient Medications on File Prior to Visit  Medication Sig Dispense Refill   acetaminophen (TYLENOL) 500 MG tablet Take 1,500 mg by mouth daily as needed for moderate pain.     ALPRAZolam (XANAX) 0.5 MG tablet TAKE 1 TABLET(0.5 MG) BY MOUTH TWICE DAILY AS NEEDED FOR  ANXIETY 60 tablet 2   ELIQUIS 5 MG TABS tablet TAKE 1 TABLET(5 MG) BY MOUTH TWICE DAILY 60 tablet 3   fluticasone (FLONASE) 50 MCG/ACT nasal spray SHAKE LIQUID AND USE 2 SPRAYS IN EACH NOSTRIL DAILY 48 g 0   mirtazapine (REMERON) 30 MG tablet TAKE 1 TABLET(30 MG) BY MOUTH AT BEDTIME 30 tablet 2   Omega-3 Fatty Acids (FISH OIL) 1000 MG CAPS Take 2,000 mg by mouth at bedtime.     propranolol (INDERAL) 40 MG tablet TAKE 1 TABLET(40 MG) BY MOUTH TWICE DAILY 180 tablet 0   No current facility-administered medications on file prior to visit.  BP 128/90   Pulse 93   Resp 18   Ht '5\' 8"'$  (1.727 m)   Wt 232 lb (105.2 kg)   SpO2 96%   BMI 35.28 kg/m       Objective:   Physical Exam  General Mental Status- Alert. General Appearance- Not in acute distress.   Skin General: Color- Normal Color. Moisture- Normal Moisture.  Neck Carotid Arteries- Normal color. Moisture- Normal Moisture. No carotid bruits. No JVD.  Chest and Lung Exam Auscultation: Breath Sounds:-Normal.  Cardiovascular Auscultation:Rythm- Regular. Murmurs & Other Heart Sounds:Auscultation of the heart reveals- No Murmurs.  Abdomen Inspection:-Inspeection Normal. Palpation/Percussion:Note:No mass. Palpation and Percussion of the abdomen reveal- Non Tender, Non Distended + BS, no rebound or guarding.   Neurologic Cranial Nerve exam:- CN III-XII intact(No nystagmus), symmetric smile. Strength:- 5/5 equal and symmetric strength both upper and lower extremities.   Lower ext- calfs symmetric. No obvious pedal edema.    Assessment & Plan:   Patient Instructions  Atrial fibrillation. Continue eliqis and propanolol. BP very good today on recheck and rate controlled. Check bp at least 3 days a week and check pulse daily with 02 sat monitor. Check at rest and on acitivity. If pulse over 100 at rest will need to consider increase dose of b blocker for better rate control.  Anxiety with recent increase stress. Continue  xanax. Up to date on contract and uds.  For insomnia continue with remeron.  For fatigue will get labs today. Cause likely baby sitting grandikids for 8 hours.  For high cholesterol continue fish oil.  Follow up in 3 months or sooner if needed.   Mackie Pai, PA-C

## 2021-11-15 NOTE — Patient Instructions (Addendum)
Atrial fibrillation. Continue eliqis and propanolol. BP very good today on recheck and rate controlled. Check bp at least 3 days a week and check pulse daily with 02 sat monitor. Check at rest and on acitivity. If pulse over 100 at rest will need to consider increase dose of b blocker for better rate control.  Anxiety with recent increase stress. Continue xanax. Up to date on contract and uds.  For insomnia continue with remeron.  For fatigue will get labs today. Cause likely baby sitting grandikids for 8 hours.  For high cholesterol continue fish oil.  Follow up in 3 months or sooner if needed.

## 2021-11-20 LAB — VITAMIN B1: Vitamin B1 (Thiamine): 15 nmol/L (ref 8–30)

## 2021-12-12 ENCOUNTER — Telehealth: Payer: Self-pay | Admitting: *Deleted

## 2021-12-12 NOTE — Telephone Encounter (Signed)
Transition Care Management Unsuccessful Follow-up Telephone Call  Date of discharge and from where:  [facility]  Attempts:  1st Attempt  Reason for unsuccessful TCM follow-up call:  Left voice message   Hubert Azure RN, MSN RN Care Management Coordinator  [phone] [email or web address]

## 2021-12-14 ENCOUNTER — Other Ambulatory Visit: Payer: Self-pay | Admitting: Medical

## 2021-12-15 ENCOUNTER — Telehealth: Payer: Self-pay | Admitting: Medical

## 2021-12-15 NOTE — Telephone Encounter (Signed)
Rx xanax sent to pt pharmacy. Mackie Pai, PA-C

## 2021-12-15 NOTE — Telephone Encounter (Signed)
Rx refill of xanax sent to pt pharmacy.  Mackie Pai, PA-C

## 2021-12-28 ENCOUNTER — Other Ambulatory Visit: Payer: Self-pay | Admitting: Medical

## 2022-01-18 ENCOUNTER — Other Ambulatory Visit: Payer: Self-pay | Admitting: Medical

## 2022-02-15 ENCOUNTER — Encounter: Payer: Self-pay | Admitting: Medical

## 2022-02-15 ENCOUNTER — Ambulatory Visit (INDEPENDENT_AMBULATORY_CARE_PROVIDER_SITE_OTHER): Payer: Medicare Other | Admitting: Medical

## 2022-02-15 VITALS — BP 120/78 | HR 80 | Temp 98.2°F | Resp 18 | Ht 68.0 in | Wt 227.0 lb

## 2022-02-15 DIAGNOSIS — E785 Hyperlipidemia, unspecified: Secondary | ICD-10-CM | POA: Diagnosis not present

## 2022-02-15 DIAGNOSIS — Z8619 Personal history of other infectious and parasitic diseases: Secondary | ICD-10-CM

## 2022-02-15 DIAGNOSIS — F411 Generalized anxiety disorder: Secondary | ICD-10-CM

## 2022-02-15 DIAGNOSIS — I10 Essential (primary) hypertension: Secondary | ICD-10-CM | POA: Diagnosis not present

## 2022-02-15 LAB — COMPREHENSIVE METABOLIC PANEL
ALT: 17 U/L (ref 0–35)
AST: 18 U/L (ref 0–37)
Albumin: 3.9 g/dL (ref 3.5–5.2)
Alkaline Phosphatase: 68 U/L (ref 39–117)
BUN: 22 mg/dL (ref 6–23)
CO2: 26 mEq/L (ref 19–32)
Calcium: 9.4 mg/dL (ref 8.4–10.5)
Chloride: 106 mEq/L (ref 96–112)
Creatinine, Ser: 0.94 mg/dL (ref 0.40–1.20)
GFR: 57.87 mL/min — ABNORMAL LOW (ref >60.00)
Glucose, Bld: 107 mg/dL — ABNORMAL HIGH (ref 70–99)
Potassium: 4.3 mEq/L (ref 3.5–5.1)
Sodium: 140 mEq/L (ref 135–145)
Total Bilirubin: 0.5 mg/dL (ref 0.2–1.2)
Total Protein: 7 g/dL (ref 6.0–8.3)

## 2022-02-15 LAB — LIPID PANEL
Cholesterol: 217 mg/dL — ABNORMAL HIGH (ref 0–200)
HDL: 36.9 mg/dL — ABNORMAL LOW (ref >39.00)
NonHDL: 179.71
Total CHOL/HDL Ratio: 6
Triglycerides: 309 mg/dL — ABNORMAL HIGH (ref 0.0–149.0)
VLDL: 61.8 mg/dL — ABNORMAL HIGH (ref 0.0–40.0)

## 2022-02-15 LAB — LDL CHOLESTEROL, DIRECT: Direct LDL: 134 mg/dL

## 2022-02-15 MED ORDER — MIRTAZAPINE 30 MG PO TABS
30.0000 mg | ORAL_TABLET | Freq: Every day | ORAL | 2 refills | Status: DC
Start: 2022-02-15 — End: 2022-08-14

## 2022-02-15 NOTE — Progress Notes (Signed)
Subjective:    Patient ID: [name] Brown, female    DOB: 04-13-1941, 81 y.o.   MRN: [id number]  HPI Atrial fibrillation. On eliqis and propanolol.   Anxiety on contract and uds. Still on xanax.  High cholesterol continue fish oil.   PRIMARY DISCHARGE DIAGNOSIS:(June hospitalization in [place] this year. 1. Acute left-sided pyelonephritis with E coli bacteremia.  HOSPITAL COURSE SUMMARY: 81 year old female came in sick for about 3 days of lower abdominal pain. She was found to have pyelonephritis. She grew E coli out of the blood which is sensitive to our antibiotics. Place her on Levaquin because of allergies and she did quite well. She was discharged from our facility in stable condition as per discharge instructions. This entire discharge process has taken 40 minutes.    Pt has followed up with urologist since DC. Pt explains that kidney stone may have partially blocked flow. She states she thinks passed stone before was hospitalized.    Review of Systems  Constitutional:  Negative for chills, fatigue and fever.  HENT:  Negative for congestion and drooling.   Respiratory:  Negative for cough, chest tightness, shortness of breath and wheezing.   Cardiovascular:  Negative for chest pain and palpitations.  Gastrointestinal:  Negative for abdominal pain.  Genitourinary:  Negative for difficulty urinating, dyspareunia, dysuria and frequency.  Musculoskeletal:  Negative for back pain and myalgias.  Skin:  Negative for rash.  Neurological:  Negative for dizziness, syncope and headaches.  Hematological:  Negative for adenopathy. Does not bruise/bleed easily.  Psychiatric/Behavioral:  Positive for dysphoric mood and sleep disturbance. Negative for self-injury. The patient is nervous/anxious.        Current med regimen working.   Past Medical History:  Diagnosis Date   A-fib [facility])    Anxiety    Depression    Essential tremor    Hyperlipidemia      Social History    Socioeconomic History   Marital status: Widowed    Spouse name: Not on file   Number of children: 2   Years of education: college   Highest education level: Not on file  Occupational History   Occupation: Retired Therapist, sports  Tobacco Use   Smoking status: Never    Passive exposure: Never   Smokeless tobacco: Never  Vaping Use   Vaping Use: Never used  Substance and Sexual Activity   Alcohol use: Yes    Alcohol/week: 1.0 standard drink of alcohol    Types: 1 Glasses of wine per week    Comment: occasional glass of wine   Drug use: No   Sexual activity: Never  Other Topics Concern   Not on file  Social History Narrative   Lives alone.   Right-handed.   Two children - 1 biological, 1 adopted.   Occasional use of caffeine.   Social Determinants of Health   Financial Resource Strain: Low Risk  (06/21/2021)   Overall Financial Resource Strain (CARDIA)    Difficulty of Paying Living Expenses: Not hard at all  Food Insecurity: No Food Insecurity (06/21/2021)   Hunger Vital Sign    Worried About Running Out of Food in the Last Year: Never true    Ran Out of Food in the Last Year: Never true  Transportation Needs: No Transportation Needs (06/21/2021)   PRAPARE - Hydrologist (Medical): No    Lack of Transportation (Non-Medical): No  Physical Activity: Inactive (06/21/2021)   Exercise Vital Sign    Days of  Exercise per Week: 0 days    Minutes of Exercise per Session: 0 min  Stress: Stress Concern Present (06/21/2021)   [place]    Feeling of Stress : To some extent  Social Connections: Moderately Isolated (06/21/2021)   Social Connection and Isolation Panel [NHANES]    Frequency of Communication with Friends and Family: More than three times a week    Frequency of Social Gatherings with Friends and Family: More than three times a week    Attends Religious Services: More than 4 times per year     Active Member of [employer] or Organizations: No    Attends Archivist Meetings: Never    Marital Status: Widowed  Intimate Partner Violence: Not At Risk (06/21/2021)   Humiliation, Afraid, Rape, and Kick questionnaire    Fear of Current or Ex-Partner: No    Emotionally Abused: No    Physically Abused: No    Sexually Abused: No    Past Surgical History:  Procedure Laterality Date   ABDOMINAL HYSTERECTOMY     APPENDECTOMY     breast biopsies Bilateral    CARDIOVERSION N/A 02/15/2021   Procedure: CARDIOVERSION;  Surgeon: [name], MD;  Location: [facility] ENDOSCOPY;  Service: Cardiovascular;  Laterality: N/A;   CYST EXCISION N/A 07/10/2016   Procedure: 3CM CYST EXCISION TRUNK;  Surgeon: Aviva Signs, MD;  Location: [facility] ORS;  Service: General;  Laterality: N/A;   FL INJ LEFT KNEE CT ARTHROGRAM ([place] HX)     TONSILLECTOMY      Family History  Problem Relation Age of Onset   Atrial fibrillation Mother    COPD Mother    Atrial fibrillation Father    Lung cancer Father    Atrial fibrillation Sister     Allergies  Allergen Reactions   Penicillins Anaphylaxis    Has patient had a PCN reaction causing immediate rash, facial/tongue/throat swelling, SOB or lightheadedness with hypotension: Yes Has patient had a PCN reaction causing severe rash involving mucus membranes or skin necrosis: No Has patient had a PCN reaction that required hospitalization Yes Has patient had a PCN reaction occurring within the last 10 years: Yes If all of the above answers are "NO", then may proceed with Cephalosporin use.    Cephalosporins Other (See Comments)    Due to allergic reaction to penicillin   Prochlorperazine Edisylate     REACTION: draws mouth to side    Current Outpatient Medications on File Prior to Visit  Medication Sig Dispense Refill   acetaminophen (TYLENOL) 500 MG tablet Take 1,500 mg by mouth daily as needed for moderate pain.     ALPRAZolam (XANAX) 0.5 MG tablet TAKE  1 TABLET(0.5 MG) BY MOUTH TWICE DAILY AS NEEDED FOR ANXIETY 60 tablet 2   ELIQUIS 5 MG TABS tablet TAKE 1 TABLET(5 MG) BY MOUTH TWICE DAILY 60 tablet 3   mirtazapine (REMERON) 30 MG tablet Take 1 tablet (30 mg total) by mouth at bedtime. 30 tablet 2   Omega-3 Fatty Acids (FISH OIL) 1000 MG CAPS Take 2,000 mg by mouth at bedtime.     propranolol (INDERAL) 40 MG tablet TAKE 1 TABLET(40 MG) BY MOUTH TWICE DAILY 180 tablet 0   No current facility-administered medications on file prior to visit.    BP (!) 144/60   Pulse 80   Temp 98.2 F (36.8 C)   Resp 18   Ht '5\' 8"'$  (1.727 m)   Wt 227 lb (103 kg)  SpO2 97%   BMI 34.52 kg/m      120/78       Objective:   Physical Exam  General Mental Status- Alert. General Appearance- Not in acute distress.   Skin General: Color- Normal Color. Moisture- Normal Moisture.  Neck Carotid Arteries- Normal color. Moisture- Normal Moisture. No carotid bruits. No JVD.  Chest and Lung Exam Auscultation: Breath Sounds:-Normal.  Cardiovascular Auscultation:Rythm- Regular. Murmurs & Other Heart Sounds:Auscultation of the heart reveals- No Murmurs.  Abdomen Inspection:-Inspeection Normal. Palpation/Percussion:Note:No mass. Palpation and Percussion of the abdomen reveal- Non Tender, Non Distended + BS, no rebound or guarding.   Neurologic Cranial Nerve exam:- CN III-XII intact(No nystagmus), symmetric smile. Strength:- 5/5 equal and symmetric strength both upper and lower extremities.       Assessment & Plan:   Patient Instructions  Htn- bp well controlled today on recheck. Continue b blocker for rate control and for bp control  Atrial fibrillation(rate controlled.) On eliqis and propanolol.  Anxiety on contract and uds. Still on xanax. Let me know when you need a refill.  For insomnia and depression. Continue remeron. Refilled today.  High cholesterol- on fish oil. Recheck lipid panel and cmp today.  Glad to see sepsis  resolved.  Follow up date to be determined after lab review.   Mackie Pai, PA-C

## 2022-02-15 NOTE — Patient Instructions (Addendum)
Htn- bp well controlled today on recheck. Continue b blocker for rate control and for bp control  Atrial fibrillation(rate controlled.) On eliqis and propanolol.  Anxiety on contract and uds. Still on xanax. Let me know when you need a refill.  For insomnia and depression. Continue remeron. Refilled today.  High cholesterol- on fish oil. Recheck lipid panel and cmp today.  Glad to see sepsis resolved.  Follow up date to be determined after lab review.

## 2022-03-01 ENCOUNTER — Encounter: Payer: Self-pay | Admitting: Medical

## 2022-03-20 ENCOUNTER — Encounter: Payer: Self-pay | Admitting: Medical

## 2022-03-20 MED ORDER — ALPRAZOLAM 0.5 MG PO TABS: ORAL_TABLET | ORAL | 0 refills | Status: DC

## 2022-03-20 NOTE — Telephone Encounter (Signed)
Rx xanax sent to pt pharmacy.  Mackie Pai, PA-C

## 2022-03-20 NOTE — Telephone Encounter (Signed)
Requesting: xanax Contract:08/10/21 UDS:08/10/21 Last Visit:02/15/22 Next Visit:n/a Last Refill:12/15/21  Please Advise

## 2022-03-29 ENCOUNTER — Other Ambulatory Visit: Payer: Self-pay | Admitting: Medical

## 2022-03-29 MED ORDER — PROPRANOLOL HCL 40 MG PO TABS: 40.0000 mg | ORAL_TABLET | Freq: Two times a day (BID) | ORAL | 0 refills | Status: DC

## 2022-04-20 ENCOUNTER — Other Ambulatory Visit: Payer: Self-pay | Admitting: Medical

## 2022-04-20 ENCOUNTER — Encounter: Payer: Self-pay | Admitting: Medical

## 2022-04-20 NOTE — Telephone Encounter (Signed)
Requesting: xanax Contract: 08/31/21 UDS:08/10/21 Last Visit:02/15/22 Next Visit:05/30/22 Last Refill:03/20/22  Please Advise

## 2022-04-21 NOTE — Telephone Encounter (Signed)
Pt called to follow up on refill status. Advised pt that refill request had been routed to [facility] and we were just waiting on him to send it in. Pt stated she is out of the medication and would prefer not to go without it during the weekend.   Rx refill sent.  Mackie Pai, PA-C

## 2022-04-22 MED ORDER — ALPRAZOLAM 0.5 MG PO TABS: ORAL_TABLET | ORAL | 2 refills | Status: DC

## 2022-05-26 ENCOUNTER — Telehealth: Payer: Self-pay

## 2022-05-26 NOTE — Telephone Encounter (Signed)
PA initiated via Covermymeds; KEY:  [id number].   This medication or product is on your plan's list of covered drugs. Prior authorization is not required at this time. If your pharmacy has questions regarding the processing of your prescription, please have them call the [facility] pharmacy help desk at ([phone]. **Please note: This request was submitted electronically. Formulary lowering, tiering exception, cost reduction and/or pre-benefit determination review (including prospective Medicare hospice reviews) requests cannot be requested using this method of submission. Providers contact us at [phone] for further assistance.

## 2022-05-30 ENCOUNTER — Encounter: Payer: Self-pay | Admitting: Medical

## 2022-05-30 ENCOUNTER — Ambulatory Visit (INDEPENDENT_AMBULATORY_CARE_PROVIDER_SITE_OTHER): Payer: Medicare Other | Admitting: Medical

## 2022-05-30 VITALS — BP 130/75 | HR 79 | Temp 98.0°F | Resp 18 | Ht 68.0 in | Wt 232.0 lb

## 2022-05-30 DIAGNOSIS — Z23 Encounter for immunization: Secondary | ICD-10-CM

## 2022-05-30 DIAGNOSIS — R944 Abnormal results of kidney function studies: Secondary | ICD-10-CM

## 2022-05-30 DIAGNOSIS — I10 Essential (primary) hypertension: Secondary | ICD-10-CM | POA: Diagnosis not present

## 2022-05-30 DIAGNOSIS — R739 Hyperglycemia, unspecified: Secondary | ICD-10-CM | POA: Diagnosis not present

## 2022-05-30 DIAGNOSIS — F411 Generalized anxiety disorder: Secondary | ICD-10-CM

## 2022-05-30 DIAGNOSIS — G4700 Insomnia, unspecified: Secondary | ICD-10-CM | POA: Diagnosis not present

## 2022-05-30 DIAGNOSIS — R5383 Other fatigue: Secondary | ICD-10-CM

## 2022-05-30 DIAGNOSIS — E785 Hyperlipidemia, unspecified: Secondary | ICD-10-CM | POA: Diagnosis not present

## 2022-05-30 DIAGNOSIS — I4819 Other persistent atrial fibrillation: Secondary | ICD-10-CM

## 2022-05-30 LAB — COMPREHENSIVE METABOLIC PANEL
ALT: 18 U/L (ref 0–35)
AST: 18 U/L (ref 0–37)
Albumin: 4.2 g/dL (ref 3.5–5.2)
Alkaline Phosphatase: 73 U/L (ref 39–117)
BUN: 18 mg/dL (ref 6–23)
CO2: 28 mEq/L (ref 19–32)
Calcium: 9.5 mg/dL (ref 8.4–10.5)
Chloride: 105 mEq/L (ref 96–112)
Creatinine, Ser: 1.09 mg/dL (ref 0.40–1.20)
GFR: 48.35 mL/min — ABNORMAL LOW (ref >60.00)
Glucose, Bld: 111 mg/dL — ABNORMAL HIGH (ref 70–99)
Potassium: 5 mEq/L (ref 3.5–5.1)
Sodium: 142 mEq/L (ref 135–145)
Total Bilirubin: 0.5 mg/dL (ref 0.2–1.2)
Total Protein: 7.2 g/dL (ref 6.0–8.3)

## 2022-05-30 LAB — CBC WITH DIFFERENTIAL/PLATELET
Basophils Absolute: 0 10*3/uL (ref 0.0–0.1)
Basophils Relative: 0.8 % (ref 0.0–3.0)
Eosinophils Absolute: 0.1 10*3/uL (ref 0.0–0.7)
Eosinophils Relative: 2.5 % (ref 0.0–5.0)
HCT: 45.6 % (ref 36.0–46.0)
Hemoglobin: 15.2 g/dL — ABNORMAL HIGH (ref 12.0–15.0)
Lymphocytes Relative: 27.6 % (ref 12.0–46.0)
Lymphs Abs: 1.5 10*3/uL (ref 0.7–4.0)
MCHC: 33.3 g/dL (ref 30.0–36.0)
MCV: 90.4 fl (ref 78.0–100.0)
Monocytes Absolute: 0.4 10*3/uL (ref 0.1–1.0)
Monocytes Relative: 6.3 % (ref 3.0–12.0)
Neutro Abs: 3.5 10*3/uL (ref 1.4–7.7)
Neutrophils Relative %: 62.8 % (ref 43.0–77.0)
Platelets: 266 10*3/uL (ref 150.0–400.0)
RBC: 5.04 Mil/uL (ref 3.87–5.11)
RDW: 13.9 % (ref 11.5–15.5)
WBC: 5.6 10*3/uL (ref 4.0–10.5)

## 2022-05-30 LAB — HEMOGLOBIN A1C: Hgb A1c MFr Bld: 5.9 % (ref 4.6–6.5)

## 2022-05-30 LAB — T4, FREE: Free T4: 0.84 ng/dL (ref 0.60–1.60)

## 2022-05-30 LAB — VITAMIN B12: Vitamin B-12: 452 pg/mL (ref 211–911)

## 2022-05-30 LAB — TSH: TSH: 2.01 u[IU]/mL (ref 0.35–5.50)

## 2022-05-30 MED ORDER — APIXABAN 5 MG PO TABS: ORAL_TABLET | ORAL | 11 refills | Status: DC

## 2022-05-30 NOTE — Progress Notes (Signed)
Subjective:    Patient ID: [name] Brown, female    DOB: Aug 16, 1940, 81 y.o.   MRN: [id number]  HPI  In for follow up.  Htn- bp well controlled today on recheck. Continue b blocker for rate control and for bp control   In recent past atrial fibrillation(rate controlled.) Has been eliqis and propanolol.   Anxiety on contract and uds. Still on xanax. Let me know when you need a refill.   For insomnia and depression.  On remeron. Mood ok and sleeping ok per pt.   High cholesterol- on fish oil. Pt aware of her high cardiovascular risk score 44%. She still declines rx meds.  She got flu vaccine today. She states has decided against rsv vaccine and covid booster  On last labs mild elevated sugar and gfr mild decreased.  Review of Systems  Constitutional:  Positive for fatigue. Negative for chills.  HENT:  Negative for congestion, ear discharge and ear pain.   Respiratory:  Negative for cough, chest tightness, shortness of breath and wheezing.   Cardiovascular:  Negative for chest pain and palpitations.  Gastrointestinal:  Negative for abdominal pain.  Musculoskeletal:  Negative for back pain and myalgias.  Skin:  Negative for rash.  Neurological:  Negative for facial asymmetry, speech difficulty, weakness, light-headedness and numbness.  Hematological:  Negative for adenopathy. Does not bruise/bleed easily.  Psychiatric/Behavioral:  Negative for behavioral problems, decreased concentration and dysphoric mood.    Past Medical History:  Diagnosis Date   A-fib [facility])    Anxiety    Depression    Essential tremor    Hyperlipidemia      Social History   Socioeconomic History   Marital status: Widowed    Spouse name: Not on file   Number of children: 2   Years of education: college   Highest education level: Not on file  Occupational History   Occupation: Retired Therapist, sports  Tobacco Use   Smoking status: Never    Passive exposure: Never   Smokeless tobacco: Never  Vaping Use    Vaping Use: Never used  Substance and Sexual Activity   Alcohol use: Yes    Alcohol/week: 1.0 standard drink of alcohol    Types: 1 Glasses of wine per week    Comment: occasional glass of wine   Drug use: No   Sexual activity: Never  Other Topics Concern   Not on file  Social History Narrative   Lives alone.   Right-handed.   Two children - 1 biological, 1 adopted.   Occasional use of caffeine.   Social Determinants of Health   Financial Resource Strain: Low Risk  (06/21/2021)   Overall Financial Resource Strain (CARDIA)    Difficulty of Paying Living Expenses: Not hard at all  Food Insecurity: No Food Insecurity (06/21/2021)   Hunger Vital Sign    Worried About Running Out of Food in the Last Year: Never true    Ran Out of Food in the Last Year: Never true  Transportation Needs: No Transportation Needs (06/21/2021)   PRAPARE - Hydrologist (Medical): No    Lack of Transportation (Non-Medical): No  Physical Activity: Inactive (06/21/2021)   Exercise Vital Sign    Days of Exercise per Week: 0 days    Minutes of Exercise per Session: 0 min  Stress: Stress Concern Present (06/21/2021)   [place]    Feeling of Stress : To some extent  Social Connections: Moderately Isolated (06/21/2021)   Social Connection and Isolation Panel [NHANES]    Frequency of Communication with Friends and Family: More than three times a week    Frequency of Social Gatherings with Friends and Family: More than three times a week    Attends Religious Services: More than 4 times per year    Active Member of [employer] or Organizations: No    Attends Archivist Meetings: Never    Marital Status: Widowed  Intimate Partner Violence: Not At Risk (06/21/2021)   Humiliation, Afraid, Rape, and Kick questionnaire    Fear of Current or Ex-Partner: No    Emotionally Abused: No    Physically Abused: No    Sexually  Abused: No    Past Surgical History:  Procedure Laterality Date   ABDOMINAL HYSTERECTOMY     APPENDECTOMY     breast biopsies Bilateral    CARDIOVERSION N/A 02/15/2021   Procedure: CARDIOVERSION;  Surgeon: [name], MD;  Location[facility] ENDOSCOPY;  Service: Cardiovascular;  Laterality: N/A;   CYST EXCISION N/A 07/10/2016   Procedure: 3CM CYST EXCISION TRUNK;  Surgeon: Aviva Signs, MD;  Location: [facility] ORS;  Service: General;  Laterality: N/A;   FL INJ LEFT KNEE CT ARTHROGRAM ([place] HX)     TONSILLECTOMY      Family History  Problem Relation Age of Onset   Atrial fibrillation Mother    COPD Mother    Atrial fibrillation Father    Lung cancer Father    Atrial fibrillation Sister     Allergies  Allergen Reactions   Penicillins Anaphylaxis    Has patient had a PCN reaction causing immediate rash, facial/tongue/throat swelling, SOB or lightheadedness with hypotension: Yes Has patient had a PCN reaction causing severe rash involving mucus membranes or skin necrosis: No Has patient had a PCN reaction that required hospitalization Yes Has patient had a PCN reaction occurring within the last 10 years: Yes If all of the above answers are "NO", then may proceed with Cephalosporin use.    Cephalosporins Other (See Comments)    Due to allergic reaction to penicillin   Prochlorperazine Edisylate     REACTION: draws mouth to side    Current Outpatient Medications on File Prior to Visit  Medication Sig Dispense Refill   acetaminophen (TYLENOL) 500 MG tablet Take 1,500 mg by mouth daily as needed for moderate pain.     ALPRAZolam (XANAX) 0.5 MG tablet 1 tab po bid prn anxiety 60 tablet 2   ELIQUIS 5 MG TABS tablet TAKE 1 TABLET(5 MG) BY MOUTH TWICE DAILY 60 tablet 3   mirtazapine (REMERON) 30 MG tablet Take 1 tablet (30 mg total) by mouth at bedtime. 30 tablet 2   Omega-3 Fatty Acids (FISH OIL) 1000 MG CAPS Take 2,000 mg by mouth at bedtime.     propranolol (INDERAL) 40 MG tablet  Take 1 tablet (40 mg total) by mouth 2 (two) times daily. 180 tablet 0   No current facility-administered medications on file prior to visit.    BP 130/75   Pulse 79   Temp 98 F (36.7 C)   Resp 18   Ht '5\' 8"'$  (1.727 m)   Wt 232 lb (105.2 kg)   SpO2 94%   BMI 35.28 kg/m           Objective:   Physical Exam  General Mental Status- Alert. General Appearance- Not in acute distress.   Skin General: Color- Normal Color. Moisture- Normal Moisture.  Neck Carotid Arteries- Normal color. Moisture- Normal Moisture. No carotid bruits. No JVD.  Chest and Lung Exam Auscultation: Breath Sounds:-Normal.  Cardiovascular Auscultation:Rythm- Regular. Murmurs & Other Heart Sounds:Auscultation of the heart reveals- No Murmurs.  Abdomen Inspection:-Inspeection Normal. Palpation/Percussion:Note:No mass. Palpation and Percussion of the abdomen reveal- Non Tender, Non Distended + BS, no rebound or guarding.    Neurologic Cranial Nerve exam:- CN III-XII intact(No nystagmus), symmetric smile. Strength:- 5/5 equal and symmetric strength both upper and lower extremities.       Assessment & Plan:   Patient Instructions  Htn- bp well controlled today on recheck. Continue b blocker for rate control and for bp control   In recent past atrial fibrillation(rate controlled.) Has been eliqis and propanolol.   Anxiety on contract and uds. Still on xanax. Let me know when you need a refill.   For insomnia and depression.  On remeron. Mood ok and sleeping ok per pt.   High cholesterol- on fish oil. Pt aware of her high cardiovascular risk score 44%. She still declines rx meds.  She got flu vaccine today.   For fatigue, low gfr, elevated sugar will get cbc, cmp, tsh, t4, b12 and A1c.  Follow up in  months or sooner if needed.   Mackie Pai, PA-C

## 2022-05-30 NOTE — Addendum Note (Signed)
Addended by: Jeronimo Greaves on: 05/30/2022 09:08 AM   Modules accepted: Orders

## 2022-05-30 NOTE — Patient Instructions (Addendum)
Htn- bp well controlled today on recheck. Continue b blocker for rate control and for bp control   In recent past atrial fibrillation(rate controlled.) Has been eliqis and propanolol.   Anxiety on contract and uds. Still on xanax. Let me know when you need a refill.   For insomnia and depression.  On remeron. Mood ok and sleeping ok per pt.   High cholesterol- on fish oil. Pt aware of her high cardiovascular risk score 44%. She still declines rx meds.  She got flu vaccine today.   For fatigue, low gfr, elevated sugar will get cbc, cmp, tsh, t4, b12 and A1c.  Follow up in  months or sooner if needed.

## 2022-06-24 ENCOUNTER — Other Ambulatory Visit: Payer: Self-pay | Admitting: Medical

## 2022-07-07 ENCOUNTER — Ambulatory Visit (INDEPENDENT_AMBULATORY_CARE_PROVIDER_SITE_OTHER): Payer: Medicare Other | Admitting: *Deleted

## 2022-07-07 DIAGNOSIS — Z0000 Encounter for general adult medical examination without abnormal findings: Secondary | ICD-10-CM

## 2022-07-07 NOTE — Patient Instructions (Signed)
[name] Brown , Thank you for taking time to come for your Medicare Wellness Visit. I appreciate your ongoing commitment to your health goals. Please review the following plan we discussed and let me know if I can assist you in the future.   These are the goals we discussed:  Goals      Patient Stated     Maintain current health        This is a list of the screening recommended for you and due dates:  Health Maintenance  Topic Date Due   Complete foot exam   Never done   Eye exam for diabetics  Never done   Yearly kidney health urinalysis for diabetes  Never done   Hepatitis C Screening: USPSTF Recommendation to screen - Ages 14-79 yo.  Never done   Zoster (Shingles) Vaccine (1 of 2) Never done   Pneumonia Vaccine (2 - PCV) 01/03/2008   DTaP/Tdap/Td vaccine (2 - Tdap) 09/27/2015   COVID-19 Vaccine (3 - Moderna risk series) 03/06/2020   Hemoglobin A1C  11/29/2022   Yearly kidney function blood test for diabetes  05/31/2023   Medicare Annual Wellness Visit  07/08/2023   Flu Shot  Completed   DEXA scan (bone density measurement)  Completed   HPV Vaccine  Aged Out   Colon Cancer Screening  Discontinued     Next appointment: Follow up in one year for your annual wellness visit.   Preventive Care 10 Years and Older, Female Preventive care refers to lifestyle choices and visits with your health care provider that can promote health and wellness. What does preventive care include? A yearly physical exam. This is also called an annual well check. Dental exams once or twice a year. Routine eye exams. Ask your health care provider how often you should have your eyes checked. Personal lifestyle choices, including: Daily care of your teeth and gums. Regular physical activity. Eating a healthy diet. Avoiding tobacco and drug use. Limiting alcohol use. Practicing safe sex. Taking low-dose aspirin every day. Taking vitamin and mineral supplements as recommended by your health care  provider. What happens during an annual well check? The services and screenings done by your health care provider during your annual well check will depend on your age, overall health, lifestyle risk factors, and family history of disease. Counseling  Your health care provider may ask you questions about your: Alcohol use. Tobacco use. Drug use. Emotional well-being. Home and relationship well-being. Sexual activity. Eating habits. History of falls. Memory and ability to understand (cognition). Work and work Statistician. Reproductive health. Screening  You may have the following tests or measurements: Height, weight, and BMI. Blood pressure. Lipid and cholesterol levels. These may be checked every 5 years, or more frequently if you are over 20 years old. Skin check. Lung cancer screening. You may have this screening every year starting at age 58 if you have a 30-pack-year history of smoking and currently smoke or have quit within the past 15 years. Fecal occult blood test (FOBT) of the stool. You may have this test every year starting at age 44. Flexible sigmoidoscopy or colonoscopy. You may have a sigmoidoscopy every 5 years or a colonoscopy every 10 years starting at age 44. Hepatitis C blood test. Hepatitis B blood test. Sexually transmitted disease (STD) testing. Diabetes screening. This is done by checking your blood sugar (glucose) after you have not eaten for a while (fasting). You may have this done every 1-3 years. Bone density scan. This is done  to screen for osteoporosis. You may have this done starting at age 42. Mammogram. This may be done every 1-2 years. Talk to your health care provider about how often you should have regular mammograms. Talk with your health care provider about your test results, treatment options, and if necessary, the need for more tests. Vaccines  Your health care provider may recommend certain vaccines, such as: Influenza vaccine. This is  recommended every year. Tetanus, diphtheria, and acellular pertussis (Tdap, Td) vaccine. You may need a Td booster every 10 years. Zoster vaccine. You may need this after age 18. Pneumococcal 13-valent conjugate (PCV13) vaccine. One dose is recommended after age 33. Pneumococcal polysaccharide (PPSV23) vaccine. One dose is recommended after age 24. Talk to your health care provider about which screenings and vaccines you need and how often you need them. This information is not intended to replace advice given to you by your health care provider. Make sure you discuss any questions you have with your health care provider. Document Released: 06/25/2015 Document Revised: 02/16/2016 Document Reviewed: 03/30/2015 Elsevier Interactive Patient Education  2017 [place] Prevention in the Home Falls can cause injuries. They can happen to people of all ages. There are many things you can do to make your home safe and to help prevent falls. What can I do on the outside of my home? Regularly fix the edges of walkways and driveways and fix any cracks. Remove anything that might make you trip as you walk through a door, such as a raised step or threshold. Trim any bushes or trees on the path to your home. Use bright outdoor lighting. Clear any walking paths of anything that might make someone trip, such as rocks or tools. Regularly check to see if handrails are loose or broken. Make sure that both sides of any steps have handrails. Any raised decks and porches should have guardrails on the edges. Have any leaves, snow, or ice cleared regularly. Use sand or salt on walking paths during winter. Clean up any spills in your garage right away. This includes oil or grease spills. What can I do in the bathroom? Use night lights. Install grab bars by the toilet and in the tub and shower. Do not use towel bars as grab bars. Use non-skid mats or decals in the tub or shower. If you need to sit down in  the shower, use a plastic, non-slip stool. Keep the floor dry. Clean up any water that spills on the floor as soon as it happens. Remove soap buildup in the tub or shower regularly. Attach bath mats securely with double-sided non-slip rug tape. Do not have throw rugs and other things on the floor that can make you trip. What can I do in the bedroom? Use night lights. Make sure that you have a light by your bed that is easy to reach. Do not use any sheets or blankets that are too big for your bed. They should not hang down onto the floor. Have a firm chair that has side arms. You can use this for support while you get dressed. Do not have throw rugs and other things on the floor that can make you trip. What can I do in the kitchen? Clean up any spills right away. Avoid walking on wet floors. Keep items that you use a lot in easy-to-reach places. If you need to reach something above you, use a strong step stool that has a grab bar. Keep electrical cords out of the  way. Do not use floor polish or wax that makes floors slippery. If you must use wax, use non-skid floor wax. Do not have throw rugs and other things on the floor that can make you trip. What can I do with my stairs? Do not leave any items on the stairs. Make sure that there are handrails on both sides of the stairs and use them. Fix handrails that are broken or loose. Make sure that handrails are as Minnie as the stairways. Check any carpeting to make sure that it is firmly attached to the stairs. Fix any carpet that is loose or worn. Avoid having throw rugs at the top or bottom of the stairs. If you do have throw rugs, attach them to the floor with carpet tape. Make sure that you have a light switch at the top of the stairs and the bottom of the stairs. If you do not have them, ask someone to add them for you. What else can I do to help prevent falls? Wear shoes that: Do not have high heels. Have rubber bottoms. Are comfortable  and fit you well. Are closed at the toe. Do not wear sandals. If you use a stepladder: Make sure that it is fully opened. Do not climb a closed stepladder. Make sure that both sides of the stepladder are locked into place. Ask someone to hold it for you, if possible. Clearly mark and make sure that you can see: Any grab bars or handrails. First and last steps. Where the edge of each step is. Use tools that help you move around (mobility aids) if they are needed. These include: Canes. Walkers. Scooters. Crutches. Turn on the lights when you go into a dark area. Replace any light bulbs as soon as they burn out. Set up your furniture so you have a clear path. Avoid moving your furniture around. If any of your floors are uneven, fix them. If there are any pets around you, be aware of where they are. Review your medicines with your doctor. Some medicines can make you feel dizzy. This can increase your chance of falling. Ask your doctor what other things that you can do to help prevent falls. This information is not intended to replace advice given to you by your health care provider. Make sure you discuss any questions you have with your health care provider. Document Released: 03/25/2009 Document Revised: 11/04/2015 Document Reviewed: 07/03/2014 Elsevier Interactive Patient Education  2017 [employer].

## 2022-07-07 NOTE — Progress Notes (Addendum)
Subjective:   [name] Brown is a 82 y.o. female who presents for Medicare Annual (Subsequent) preventive examination.  I connected with  Crissie Sickles on 07/07/22 by a audio enabled telemedicine application and verified that I am speaking with the correct person using two identifiers.  Patient Location: Home  Provider Location: Office/Clinic  I discussed the limitations of evaluation and management by telemedicine. The patient expressed understanding and agreed to proceed.   Review of Systems    Defer to PCP Cardiac Risk Factors include: advanced age (>16mn, >>62women);dyslipidemia     Objective:    There were no vitals filed for this visit. There is no height or weight on file to calculate BMI.     07/07/2022    9:41 AM 06/21/2021    3:44 PM 02/15/2021    7:54 AM 01/24/2021   12:03 PM 12/31/2020    3:05 PM 05/16/2020    6:59 PM 07/10/2016    7:16 AM  Advanced Directives  Does Patient Have a Medical Advance Directive? Yes Yes Yes Yes Yes Yes No  Type of AParamedicof [place] will [place] CenterLiving will Healthcare Power of [place]Living will    Does patient want to make changes to medical advance directive? No - Patient declined        Copy of HB[place] Chart? No - copy requested No - copy requested No - copy requested      Would patient like information on creating a medical advance directive?       Yes (Inpatient - patient requests chaplain consult to create a medical advance directive);No - Patient declined    Current Medications (verified) Outpatient Encounter Medications as of 07/07/2022  Medication Sig   acetaminophen (TYLENOL) 500 MG tablet Take 1,500 mg by mouth daily as needed for moderate pain.   ALPRAZolam (XANAX) 0.5 MG tablet 1 tab po bid prn anxiety   apixaban (ELIQUIS) 5 MG TABS tablet TAKE 1 TABLET(5 MG) BY MOUTH TWICE DAILY   mirtazapine (REMERON) 30 MG  tablet Take 1 tablet (30 mg total) by mouth at bedtime.   Omega-3 Fatty Acids (FISH OIL) 1000 MG CAPS Take 2,000 mg by mouth at bedtime.   propranolol (INDERAL) 40 MG tablet TAKE 1 TABLET(40 MG) BY MOUTH TWICE DAILY   No facility-administered encounter medications on file as of 07/07/2022.    Allergies (verified) Penicillins, Cephalosporins, and Prochlorperazine edisylate   History: Past Medical History:  Diagnosis Date   A-fib ([place]    Anxiety    Depression    Essential tremor    Hyperlipidemia    Past Surgical History:  Procedure Laterality Date   ABDOMINAL HYSTERECTOMY     APPENDECTOMY     breast biopsies Bilateral    CARDIOVERSION N/A 02/15/2021   Procedure: CARDIOVERSION;  Surgeon: SDonato Heinz MD;  [facility];  Service: Cardiovascular;  Laterality: N/A;   CYST EXCISION N/A 07/10/2016   Procedure: 3CM CYST EXCISION TRUNK;  Surgeon: MAviva Signs MD;  Location: [facility];  Service: General;  Laterality: N/A;   [place] INJ LEFT KNEE CT ARTHROGRAM ([place])     TONSILLECTOMY     Family History  Problem Relation Age of Onset   Atrial fibrillation Mother    COPD Mother    Atrial fibrillation Father    Lung cancer Father    Atrial fibrillation Sister    Social History   Socioeconomic History  Marital status: Widowed    Spouse name: Not on file   Number of children: 2   Years of education: college   Highest education level: Not on file  Occupational History   Occupation: Retired Therapist, sports  Tobacco Use   Smoking status: Never    Passive exposure: Never   Smokeless tobacco: Never  Vaping Use   Vaping Use: Never used  Substance and Sexual Activity   Alcohol use: Yes    Alcohol/week: 1.0 standard drink of alcohol    Types: 1 Glasses of wine per week    Comment: occasional glass of wine   Drug use: No   Sexual activity: Never  Other Topics Concern   Not on file  Social History Narrative   Lives alone.   Right-handed.   Two children - 1 biological, 1  adopted.   Occasional use of caffeine.   Social Determinants of Health   Financial Resource Strain: Low Risk  (06/21/2021)   Overall Financial Resource Strain (CARDIA)    Difficulty of Paying Living Expenses: Not hard at all  Food Insecurity: No Food Insecurity (07/07/2022)   Hunger Vital Sign    Worried About Running Out of Food in the Last Year: Never true    Ran Out of Food in the Last Year: Never true  Transportation Needs: No Transportation Needs (07/07/2022)   PRAPARE - Hydrologist (Medical): No    Lack of Transportation (Non-Medical): No  Physical Activity: Inactive (06/21/2021)   Exercise Vital Sign    Days of Exercise per Week: 0 days    Minutes of Exercise per Session: 0 min  Stress: Stress Concern Present (06/21/2021)   [place]    Feeling of Stress : To some extent  Social Connections: Moderately Isolated (06/21/2021)   Social Connection and Isolation Panel [NHANES]    Frequency of Communication with Friends and Family: More than three times a week    Frequency of Social Gatherings with Friends and Family: More than three times a week    Attends Religious Services: More than 4 times per year    Active Member of [employer] or Organizations: No    Attends Archivist Meetings: Never    Marital Status: Widowed    Tobacco Counseling Counseling given: Not Answered   Clinical Intake:  Pre-visit preparation completed: Yes  Pain : No/denies pain  Diabetes: No  How often do you need to have someone help you when you read instructions, pamphlets, or other written materials from your doctor or pharmacy?: 1 - Never  Activities of Daily Living    07/07/2022    9:45 AM  In your present state of health, do you have any difficulty performing the following activities:  Hearing? 0  Vision? 1  Comment has cataracts  Difficulty concentrating or making decisions? 0  Walking or  climbing stairs? 1  Comment gets a little SOB  Dressing or bathing? 0  Doing errands, shopping? 0  Preparing Food and eating ? N  Using the Toilet? N  In the past six months, have you accidently leaked urine? Y  Comment wears pads  Do you have problems with loss of bowel control? N  Managing your Medications? N  Managing your Finances? N  Housekeeping or managing your Housekeeping? N    Patient Care Team: [name], Iris Pert as PCP - General (Internal Medicine)  Indicate any recent Medical Services you may have received from other  than [facility] providers in the past year (date may be approximate).     Assessment:   This is a routine wellness examination for [place].  Hearing/Vision screen No results found.  Dietary issues and exercise activities discussed: Current Exercise Habits: The patient does not participate in regular exercise at present, Exercise limited by: cardiac condition(s)   Goals Addressed   None    Depression Screen    07/07/2022    9:45 AM 06/21/2021    3:46 PM  PHQ 2/9 Scores  PHQ - 2 Score 0 0    Fall Risk    07/07/2022    9:42 AM 06/21/2021    3:45 PM  [place] in the past year? 0 0  Number falls in past yr: 0 0  Injury with Fall? 0 0  Risk for fall due to : No Fall Risks   Follow up Falls evaluation completed Falls prevention discussed    [place]:  Any stairs in or around the home? No  If so, are there any without handrails? No  Home free of loose throw rugs in walkways, pet beds, electrical cords, etc? Yes  Adequate lighting in your home to reduce risk of falls? Yes   ASSISTIVE DEVICES UTILIZED TO PREVENT FALLS:  Life alert? No  Use of a cane, walker or w/c? No  Grab bars in the bathroom? No  Shower chair or bench in shower? No  Elevated toilet seat or a handicapped toilet? No   TIMED UP AND GO:  Was the test performed?  No, audio visit .   Cognitive Function:        07/07/2022     9:52 AM  6CIT Screen  What Year? 0 points  What month? 0 points  What time? 0 points  Count back from 20 0 points  Months in reverse 0 points  Repeat phrase 2 points  Total Score 2 points    Immunizations Immunization History  Administered Date(s) Administered   Fluad Quad(high Dose 65+) 05/30/2022   Influenza Whole 03/22/2006, 03/19/2007, 03/17/2008, 03/30/2009, 05/18/2010   Influenza, High Dose Seasonal PF 04/18/2018   Moderna Sars-Covid-2 Vaccination 01/10/2020, 02/07/2020   Pneumococcal Polysaccharide-23 06/30/2005   Td 09/26/2005    TDAP status: Due, Education has been provided regarding the importance of this vaccine. Advised may receive this vaccine at local pharmacy or Health Dept. Aware to provide a copy of the vaccination record if obtained from local pharmacy or Health Dept. Verbalized acceptance and understanding.  Flu Vaccine status: Up to date  Pneumococcal vaccine status: Due, Education has been provided regarding the importance of this vaccine. Advised may receive this vaccine at local pharmacy or Health Dept. Aware to provide a copy of the vaccination record if obtained from local pharmacy or Health Dept. Verbalized acceptance and understanding.  Covid-19 vaccine status: Information provided on how to obtain vaccines.   Qualifies for Shingles Vaccine? Yes   Zostavax completed No   Shingrix Completed?: No.    Education has been provided regarding the importance of this vaccine. Patient has been advised to call insurance company to determine out of pocket expense if they have not yet received this vaccine. Advised may also receive vaccine at local pharmacy or Health Dept. Verbalized acceptance and understanding.  Screening Tests Health Maintenance  Topic Date Due   FOOT EXAM  Never done   OPHTHALMOLOGY EXAM  Never done   Diabetic kidney evaluation - Urine ACR  Never done  Hepatitis C Screening  Never done   Zoster Vaccines- Shingrix (1 of 2) Never done    Pneumonia Vaccine 22+ Years old (2 - PCV) 01/03/2008   DTaP/Tdap/Td (2 - Tdap) 09/27/2015   COVID-19 Vaccine (3 - Moderna risk series) 03/06/2020   Medicare Annual Wellness (AWV)  06/21/2022   HEMOGLOBIN A1C  11/29/2022   Diabetic kidney evaluation - eGFR measurement  05/31/2023   INFLUENZA VACCINE  Completed   DEXA SCAN  Completed   HPV VACCINES  Aged Out   COLONOSCOPY (Pts 45-92yr Insurance coverage will need to be confirmed)  Discontinued    Health Maintenance  Health Maintenance Due  Topic Date Due   FOOT EXAM  Never done   OPHTHALMOLOGY EXAM  Never done   Diabetic kidney evaluation - Urine ACR  Never done   Hepatitis C Screening  Never done   Zoster Vaccines- Shingrix (1 of 2) Never done   Pneumonia Vaccine 80 Years old (2 - PCV) 01/03/2008   DTaP/Tdap/Td (2 - Tdap) 09/27/2015   COVID-19 Vaccine (3 - Moderna risk series) 03/06/2020   Medicare Annual Wellness (AWV)  06/21/2022    Colorectal cancer screening: No longer required.   Mammogram status: No longer required due to pt declined.  Bone Density status: pt declined.  Lung Cancer Screening: (Low Dose CT Chest recommended if Age 80-80years, 30 pack-year currently smoking OR have quit w/in 15years.) does not qualify.   Additional Screening:  Hepatitis C Screening: does not qualify  Vision Screening: Recommended annual ophthalmology exams for early detection of glaucoma and other disorders of the eye. Is the patient up to date with their annual eye exam?  Yes  Who is the provider or what is the name of the office in which the patient attends annual eye exams[place] not established with a provider, would they like to be referred to a provider to establish care? No .   Dental Screening: Recommended annual dental exams for proper oral hygiene  Community Resource Referral / Chronic Care Management: CRR required this visit?  No   CCM required this visit?  No      Plan:     I have personally  reviewed and noted the following in the patient's chart:   Medical and social history Use of alcohol, tobacco or illicit drugs  Current medications and supplements including opioid prescriptions. Patient is not currently taking opioid prescriptions. Functional ability and status Nutritional status Physical activity Advanced directives List of other physicians Hospitalizations, surgeries, and ER visits in previous 12 months Vitals Screenings to include cognitive, depression, and falls Referrals and appointments  In addition, I have reviewed and discussed with patient certain preventive protocols, quality metrics, and best practice recommendations. A written personalized care plan for preventive services as well as general preventive health recommendations were provided to patient.   Due to this being a telephonic visit, the after visit summary with patients personalized plan was offered to patient via mail or my-chart.  Patient would like to access on my-chart.   BBeatris Ship [facility]  07/07/2022   Nurse Notes: None  Review and Agree with assessment & plan of cma.  EMackie Pai PA-C

## 2022-07-20 ENCOUNTER — Encounter: Payer: Self-pay | Admitting: Medical

## 2022-07-20 ENCOUNTER — Other Ambulatory Visit: Payer: Self-pay | Admitting: Medical

## 2022-07-20 NOTE — Telephone Encounter (Signed)
Requesting: alprazolam 0.'5mg'$   Contract: 08/10/21 UDS: 08/10/21 Last Visit: 05/30/22 Next Visit: 08/29/22 Last Refill: 04/22/22 #60 and 2RF   Please Advise

## 2022-07-21 MED ORDER — ALPRAZOLAM 0.5 MG PO TABS: ORAL_TABLET | ORAL | 0 refills | Status: DC

## 2022-07-21 NOTE — Telephone Encounter (Signed)
Just sent in refill of xanax. She is due for updated contract and uds. Needs to have that done by beginning of march. Does not need appt with me just to do uds and contrat.

## 2022-07-21 NOTE — Telephone Encounter (Signed)
Requesting: xanax Contract: 08/10/21 UDS:08/10/21 Last Visit:05/30/22 Next Visit:08/29/22 Last Refill:05/01/2022  Please Advise

## 2022-08-14 ENCOUNTER — Other Ambulatory Visit: Payer: Self-pay | Admitting: *Deleted

## 2022-08-14 MED ORDER — MIRTAZAPINE 30 MG PO TABS: 30.0000 mg | ORAL_TABLET | Freq: Every day | ORAL | 2 refills | Status: DC

## 2022-08-17 ENCOUNTER — Other Ambulatory Visit: Payer: Self-pay | Admitting: Medical

## 2022-08-17 NOTE — Telephone Encounter (Signed)
Requesting: alprazolam 0.'5mg'$  Contract: 08/10/21 will update at next appt UDS: 08/10/21 will update at next appt Last Visit: 05/30/2022 Next Visit: 08/23/2022 Last Refill: 07/21/22  Please Advise

## 2022-08-18 NOTE — Telephone Encounter (Signed)
Low number refill sent pending her follow up appointment.

## 2022-08-23 ENCOUNTER — Encounter: Payer: Self-pay | Admitting: Medical

## 2022-08-23 ENCOUNTER — Ambulatory Visit (INDEPENDENT_AMBULATORY_CARE_PROVIDER_SITE_OTHER): Payer: Medicare Other | Admitting: Medical

## 2022-08-23 VITALS — BP 120/70 | HR 85 | Temp 98.0°F | Resp 18 | Ht 68.0 in | Wt 231.6 lb

## 2022-08-23 DIAGNOSIS — Z79899 Other long term (current) drug therapy: Secondary | ICD-10-CM

## 2022-08-23 DIAGNOSIS — G4700 Insomnia, unspecified: Secondary | ICD-10-CM | POA: Diagnosis not present

## 2022-08-23 DIAGNOSIS — F3289 Other specified depressive episodes: Secondary | ICD-10-CM | POA: Diagnosis not present

## 2022-08-23 DIAGNOSIS — F411 Generalized anxiety disorder: Secondary | ICD-10-CM

## 2022-08-23 MED ORDER — ALPRAZOLAM 0.5 MG PO TABS: ORAL_TABLET | ORAL | 0 refills | Status: DC

## 2022-08-23 NOTE — Progress Notes (Signed)
Subjective:    Patient ID: [name] Brown, female    DOB: 1941/03/02, 82 y.o.   MRN: [id number]  HPI  Pt in for follow up for anxiety. Pt needs to update controlled med contract and give uds. Pt uses xanax 0.5 mg bid.  Pt was on xanax numerous for 25 years before came to our practice.   Also some depression- pt on remeron for mood and sleep.  Gad-7 score 13. Phq-9   Mild high bp initially. Better on recheck. Better on recheck.  Pt about to go on cruise.      Review of Systems  Constitutional:  Negative for chills, diaphoresis, fatigue and fever.  Respiratory:  Negative for cough, chest tightness, shortness of breath and wheezing.   Cardiovascular:  Negative for chest pain and palpitations.  Gastrointestinal:  Negative for abdominal pain.  Musculoskeletal:  Negative for back pain.  Skin:  Negative for rash.  Neurological:  Negative for dizziness, light-headedness and numbness.  Hematological:  Negative for adenopathy. Does not bruise/bleed easily.  Psychiatric/Behavioral:  Positive for sleep disturbance. Negative for agitation, decreased concentration and suicidal ideas. The patient is nervous/anxious.     Past Medical History:  Diagnosis Date   A-fib [facility])    Anxiety    Depression    Essential tremor    Hyperlipidemia      Social History   Socioeconomic History   Marital status: Widowed    Spouse name: Not on file   Number of children: 2   Years of education: college   Highest education level: Not on file  Occupational History   Occupation: Retired Therapist, sports  Tobacco Use   Smoking status: Never    Passive exposure: Never   Smokeless tobacco: Never  Vaping Use   Vaping Use: Never used  Substance and Sexual Activity   Alcohol use: Yes    Alcohol/week: 1.0 standard drink of alcohol    Types: 1 Glasses of wine per week    Comment: occasional glass of wine   Drug use: No   Sexual activity: Never  Other Topics Concern   Not on file  Social History Narrative    Lives alone.   Right-handed.   Two children - 1 biological, 1 adopted.   Occasional use of caffeine.   Social Determinants of Health   Financial Resource Strain: Low Risk  (06/21/2021)   Overall Financial Resource Strain (CARDIA)    Difficulty of Paying Living Expenses: Not hard at all  Food Insecurity: No Food Insecurity (07/07/2022)   Hunger Vital Sign    Worried About Running Out of Food in the Last Year: Never true    Ran Out of Food in the Last Year: Never true  Transportation Needs: No Transportation Needs (07/07/2022)   PRAPARE - Hydrologist (Medical): No    Lack of Transportation (Non-Medical): No  Physical Activity: Inactive (06/21/2021)   Exercise Vital Sign    Days of Exercise per Week: 0 days    Minutes of Exercise per Session: 0 min  Stress: Stress Concern Present (06/21/2021)   [place]    Feeling of Stress : To some extent  Social Connections: Moderately Isolated (06/21/2021)   Social Connection and Isolation Panel [NHANES]    Frequency of Communication with Friends and Family: More than three times a week    Frequency of Social Gatherings with Friends and Family: More than three times a week  Attends Religious Services: More than 4 times per year    Active Member of Clubs or Organizations: No    Attends Archivist Meetings: Never    Marital Status: Widowed  Intimate Partner Violence: Not At Risk (07/07/2022)   Humiliation, Afraid, Rape, and Kick questionnaire    Fear of Current or Ex-Partner: No    Emotionally Abused: No    Physically Abused: No    Sexually Abused: No    Past Surgical History:  Procedure Laterality Date   ABDOMINAL HYSTERECTOMY     APPENDECTOMY     breast biopsies Bilateral    CARDIOVERSION N/A 02/15/2021   Procedure: CARDIOVERSION;  Surgeon: [name], MD;  Location: [facility] ENDOSCOPY;  Service: Cardiovascular;  Laterality:  N/A;   CYST EXCISION N/A 07/10/2016   Procedure: 3CM CYST EXCISION TRUNK;  Surgeon: Aviva Signs, MD;  Location: [facility] ORS;  Service: General;  Laterality: N/A;   FL INJ LEFT KNEE CT ARTHROGRAM ([place] HX)     TONSILLECTOMY      Family History  Problem Relation Age of Onset   Atrial fibrillation Mother    COPD Mother    Atrial fibrillation Father    Lung cancer Father    Atrial fibrillation Sister     Allergies  Allergen Reactions   Penicillins Anaphylaxis    Has patient had a PCN reaction causing immediate rash, facial/tongue/throat swelling, SOB or lightheadedness with hypotension: Yes Has patient had a PCN reaction causing severe rash involving mucus membranes or skin necrosis: No Has patient had a PCN reaction that required hospitalization Yes Has patient had a PCN reaction occurring within the last 10 years: Yes If all of the above answers are "NO", then may proceed with Cephalosporin use.    Cephalosporins Other (See Comments)    Due to allergic reaction to penicillin   Prochlorperazine Edisylate     REACTION: draws mouth to side    Current Outpatient Medications on File Prior to Visit  Medication Sig Dispense Refill   acetaminophen (TYLENOL) 500 MG tablet Take 1,500 mg by mouth daily as needed for moderate pain.     ALPRAZolam (XANAX) 0.5 MG tablet TAKE 1 TABLET BY MOUTH TWICE DAILY AS NEEDED FOR ANXIETY 12 tablet 0   apixaban (ELIQUIS) 5 MG TABS tablet TAKE 1 TABLET(5 MG) BY MOUTH TWICE DAILY 60 tablet 11   mirtazapine (REMERON) 30 MG tablet Take 1 tablet (30 mg total) by mouth at bedtime. 30 tablet 2   Omega-3 Fatty Acids (FISH OIL) 1000 MG CAPS Take 2,000 mg by mouth at bedtime.     propranolol (INDERAL) 40 MG tablet TAKE 1 TABLET(40 MG) BY MOUTH TWICE DAILY 180 tablet 0   No current facility-administered medications on file prior to visit.    BP 120/70   Pulse 85   Temp 98 F (36.7 C)   Resp 18   Ht '5\' 8"'$  (1.727 m)   Wt 231 lb 9.6 oz (105.1 kg)   SpO2 95%   BMI  35.21 kg/m        Objective:   Physical Exam  General Mental Status- Alert. General Appearance- Not in acute distress.   Skin General: Color- Normal Color. Moisture- Normal Moisture.  Neck Carotid Arteries- Normal color. Moisture- Normal Moisture. No carotid bruits. No JVD.  Chest and Lung Exam Auscultation: Breath Sounds:-Normal.  Cardiovascular Auscultation:Rythm- Regular. Murmurs & Other Heart Sounds:Auscultation of the heart reveals- No Murmurs.  Abdomen Inspection:-Inspeection Normal. Palpation/Percussion:Note:No mass. Palpation and Percussion of the abdomen  reveal- Non Tender, Non Distended + BS, no rebound or guarding.  Neurologic Cranial Nerve exam:- CN III-XII intact(No nystagmus), symmetric smile. etric strength both upper and lower extremities.      Assessment & Plan:   Patient Instructions  Anxiety- controlled with xanax. Up date contract and uds today. Refill rx today.  Discussed today possible use of sertraline in future to see can reduce use of xanax.   Mild depressed mood and insomnia. Controlled with remeron.  Follow up one month or sooner if needed.    Mackie Pai, PA-C

## 2022-08-23 NOTE — Patient Instructions (Addendum)
Anxiety- controlled with xanax. Up date contract and uds today. Refill rx today.  Discussed today possible use of sertraline in future to see can reduce use of xanax.   Mild depressed mood and insomnia. Controlled with remeron.  Follow up one month or sooner if needed.

## 2022-08-25 LAB — DRUG MONITORING PANEL 376104, URINE

## 2022-08-25 LAB — DM TEMPLATE

## 2022-08-29 ENCOUNTER — Ambulatory Visit: Payer: Medicare Other | Admitting: Medical

## 2022-09-21 ENCOUNTER — Other Ambulatory Visit: Payer: Self-pay | Admitting: Medical

## 2022-09-22 NOTE — Telephone Encounter (Signed)
Requesting: alprazolam 0.5mg   Contract: 08/23/22 UDS: 08/23/22 Last Visit: 08/23/22 Next Visit: 10/31/22 Last Refill: 08/23/22 #60 and 0RF     Please Advise

## 2022-09-24 ENCOUNTER — Other Ambulatory Visit: Payer: Self-pay | Admitting: Medical

## 2022-09-25 MED ORDER — ALPRAZOLAM 0.5 MG PO TABS: ORAL_TABLET | ORAL | 3 refills | Status: DC

## 2022-09-25 NOTE — Telephone Encounter (Signed)
Rx refill sent to pharmacy.  Nkenge Sonntag, PA-C  

## 2022-09-25 NOTE — Telephone Encounter (Signed)
Requesting: alprazolam 0.5mg  Contract: 08/23/22 UDS: 08/23/22 Last Visit: 08/23/22 Next Visit: 10/31/22 Last Refill: 08/23/22 #60 and 0RF     Please Advise  

## 2022-09-29 ENCOUNTER — Other Ambulatory Visit: Payer: Self-pay | Admitting: Medical

## 2022-10-18 ENCOUNTER — Ambulatory Visit: Payer: Medicare Other | Admitting: Medical

## 2022-10-18 DIAGNOSIS — L821 Other seborrheic keratosis: Secondary | ICD-10-CM | POA: Diagnosis not present

## 2022-10-18 DIAGNOSIS — D225 Melanocytic nevi of trunk: Secondary | ICD-10-CM | POA: Diagnosis not present

## 2022-10-18 DIAGNOSIS — D1801 Hemangioma of skin and subcutaneous tissue: Secondary | ICD-10-CM | POA: Diagnosis not present

## 2022-10-18 DIAGNOSIS — D2271 Melanocytic nevi of right lower limb, including hip: Secondary | ICD-10-CM | POA: Diagnosis not present

## 2022-10-18 DIAGNOSIS — L570 Actinic keratosis: Secondary | ICD-10-CM | POA: Diagnosis not present

## 2022-10-18 DIAGNOSIS — L814 Other melanin hyperpigmentation: Secondary | ICD-10-CM | POA: Diagnosis not present

## 2022-10-18 DIAGNOSIS — Z85828 Personal history of other malignant neoplasm of skin: Secondary | ICD-10-CM | POA: Diagnosis not present

## 2022-10-18 DIAGNOSIS — B078 Other viral warts: Secondary | ICD-10-CM | POA: Diagnosis not present

## 2022-10-23 ENCOUNTER — Ambulatory Visit: Payer: Medicare Other | Admitting: Medical

## 2022-10-30 ENCOUNTER — Ambulatory Visit: Payer: Medicare Other | Admitting: Medical

## 2022-10-31 ENCOUNTER — Ambulatory Visit: Payer: Medicare Other | Admitting: Medical

## 2022-11-08 ENCOUNTER — Other Ambulatory Visit: Payer: Self-pay | Admitting: Medical

## 2022-11-13 ENCOUNTER — Encounter: Payer: Self-pay | Admitting: Medical

## 2022-11-13 ENCOUNTER — Ambulatory Visit (INDEPENDENT_AMBULATORY_CARE_PROVIDER_SITE_OTHER): Payer: Medicare Other | Admitting: Medical

## 2022-11-13 VITALS — BP 137/78 | HR 93 | Temp 98.0°F | Resp 18 | Ht 68.0 in | Wt 236.0 lb

## 2022-11-13 DIAGNOSIS — G4700 Insomnia, unspecified: Secondary | ICD-10-CM | POA: Diagnosis not present

## 2022-11-13 DIAGNOSIS — F411 Generalized anxiety disorder: Secondary | ICD-10-CM

## 2022-11-13 DIAGNOSIS — F3289 Other specified depressive episodes: Secondary | ICD-10-CM

## 2022-11-13 DIAGNOSIS — I4891 Unspecified atrial fibrillation: Secondary | ICD-10-CM | POA: Diagnosis not present

## 2022-11-13 NOTE — Progress Notes (Signed)
Subjective:    Patient ID: [name] Brown, female    DOB: 29-Mar-1941, 82 y.o.   MRN: [id number]  HPI  Pt in for follow up.   Pt had seen Dr. Dulce Sellar in the past. Last seen in 2022  ASSESSMENT:     1. Persistent atrial fibrillation (HCC)   2. Chronic anticoagulation   3. Mixed hyperlipidemia     PLAN:     In order of problems listed above:   Unfortunately we are unable to restore sinus rhythm she is minimally symptomatic from atrial fibrillation and will continue with rate control and anticoagulation. Continue her current anticoagulant Continue fish oil supplement Continue to trend blood pressures she was in a terrible hurry getting here today coming from childcare and subsequent repeat blood pressure was mildly elevated  Pt has cataracts and she will be having surgery after August.    Last seen in March for anxiety, depressed mood and insomnia.  Pt is on xanax and remeron. We had discussion on trying sertraline. Pt still reluctant to try. Also on attempting to add on low dose sertaling get serotonin warming.  Pt will be leaving for [place] at end of June. She will be there 3 weeks.   Pt will run out of xanax, remeron and eliquis.  Review of Systems  Constitutional:  Negative for chills and fatigue.  Respiratory:  Negative for cough, chest tightness, wheezing and stridor.   Cardiovascular:  Negative for chest pain and palpitations.  Gastrointestinal:  Negative for abdominal pain and blood in stool.  Genitourinary:  Negative for dyspareunia, enuresis, flank pain and frequency.  Musculoskeletal:  Negative for back pain, joint swelling and myalgias.  Skin:  Negative for rash.  Neurological:  Negative for dizziness, speech difficulty, weakness and headaches.  Hematological:  Negative for adenopathy. Does not bruise/bleed easily.  Psychiatric/Behavioral:  Positive for behavioral problems and dysphoric mood. Negative for decreased concentration, hallucinations and sleep  disturbance. The patient is nervous/anxious.        Mood anxiety and insomnia stable on current regimen.          Objective:   Physical Exam  General Mental Status- Alert. General Appearance- Not in acute distress.   Skin General: Color- Normal Color. Moisture- Normal Moisture.  Neck Carotid Arteries- Normal color. Moisture- Normal Moisture. No carotid bruits. No JVD.  Chest and Lung Exam Auscultation: Breath Sounds:-Normal.  Cardiovascular Auscultation:Rythm- Regular. Murmurs & Other Heart Sounds:Auscultation of the heart reveals- No Murmurs.  Abdomen Inspection:-Inspeection Normal. Palpation/Percussion:Note:No mass. Palpation and Percussion of the abdomen reveal- Non Tender, Non Distended + BS, no rebound or guarding.   Neurologic Cranial Nerve exam:- CN III-XII intact(No nystagmus), symmetric smile. Strength:- 5/5 equal and symmetric strength both upper and lower extremities.      Assessment & Plan:   Patient Instructions  Anxiety- controlled with xanax. Up date contract and uds today.     Mild depressed mood and insomnia.  Well controlled with remeron recently. On discussion today decided not to rx sertraline since you are stable and concern for med side effect serotonin syndrome.  Ask you contact me early week of June 17 for print rx of xanax, eliquis and remeron. So you can fill while in [place]  Atrial fibrillation. Continue propanolol and eliquis. Referral send to get established with Dr. Dewayne Shorter. Get opinion no timing of stopping eliquis prior to cataract surgery.  - Ambulatory referral to Cardiology   Follow up in 3 month or sooner if needed  Esperanza Richters, PA-C

## 2022-11-13 NOTE — Patient Instructions (Addendum)
Anxiety- controlled with xanax. Up date contract and uds today.     Mild depressed mood and insomnia.  Well controlled with remeron recently. On discussion today decided not to rx sertraline since you are stable and concern for med side effect serotonin syndrome.  Ask you contact me early week of June 17 for print rx of xanax, eliquis and remeron. So you can fill while in [place]  Atrial fibrillation.(Rate controlled) Continue propanolol and eliquis. Referral send to get established with Dr. Dewayne Shorter. Get opinion no timing of stopping eliquis prior to cataract surgery.  - Ambulatory referral to Cardiology   Follow up in 3 month or sooner if needed

## 2022-11-20 ENCOUNTER — Encounter: Payer: Self-pay | Admitting: Medical

## 2022-11-23 DIAGNOSIS — I4891 Unspecified atrial fibrillation: Secondary | ICD-10-CM | POA: Insufficient documentation

## 2022-12-05 ENCOUNTER — Ambulatory Visit: Payer: Medicare Other | Attending: Cardiology | Admitting: Cardiology

## 2022-12-05 ENCOUNTER — Ambulatory Visit (HOSPITAL_BASED_OUTPATIENT_CLINIC_OR_DEPARTMENT_OTHER)
Admission: RE | Admit: 2022-12-05 | Discharge: 2022-12-05 | Disposition: A | Discharge status: Home / Self Care | Payer: Medicare Other | Source: Ambulatory Visit | Attending: Cardiology | Admitting: Cardiology

## 2022-12-05 ENCOUNTER — Encounter: Payer: Self-pay | Admitting: Cardiology

## 2022-12-05 VITALS — BP 138/90 | HR 90 | Ht 67.0 in | Wt 232.1 lb

## 2022-12-05 DIAGNOSIS — I4819 Other persistent atrial fibrillation: Secondary | ICD-10-CM

## 2022-12-05 DIAGNOSIS — R011 Cardiac murmur, unspecified: Secondary | ICD-10-CM | POA: Insufficient documentation

## 2022-12-05 DIAGNOSIS — Z7901 Long term (current) use of anticoagulants: Secondary | ICD-10-CM | POA: Diagnosis not present

## 2022-12-05 DIAGNOSIS — I4891 Unspecified atrial fibrillation: Secondary | ICD-10-CM | POA: Diagnosis not present

## 2022-12-05 HISTORY — DX: Cardiac murmur, unspecified: R01.1

## 2022-12-05 NOTE — Patient Instructions (Signed)
Medication Instructions:  Your physician recommends that you continue on your current medications as directed. Please refer to the Current Medication list given to you today.  *If you need a refill on your cardiac medications before your next appointment, please call your pharmacy*   Lab Work: Your physician recommends that you have a CMP, CBC and TSH today.  If you have labs (blood work) drawn today and your tests are completely normal, you will receive your results only by: MyChart Message (if you have MyChart) OR A paper copy in the mail If you have any lab test that is abnormal or we need to change your treatment, we will call you to review the results.   Testing/Procedures: Your physician has requested that you have an echocardiogram. Echocardiography is a painless test that uses sound waves to create images of your heart. It provides your doctor with information about the size and shape of your heart and how well your heart's chambers and valves are working. This procedure takes approximately one hour. There are no restrictions for this procedure. Please do NOT wear cologne, perfume, aftershave, or lotions (deodorant is allowed). Please arrive 15 minutes prior to your appointment time.  A chest x-ray takes a picture of the organs and structures inside the chest, including the heart, lungs, and blood vessels. This test can show several things, including, whether the heart is enlarges; whether fluid is building up in the lungs; and whether pacemaker / defibrillator leads are still in place.    Follow-Up: At [facility], you and your health needs are our priority.  As part of our continuing mission to provide you with exceptional heart care, we have created designated Provider Care Teams.  These Care Teams include your primary Cardiologist (physician) and Advanced Practice Providers (APPs -  Physician Assistants and Nurse Practitioners) who all work together to provide you with  the care you need, when you need it.  We recommend signing up for the patient portal called "MyChart".  Sign up information is provided on this After Visit Summary.  MyChart is used to connect with patients for Virtual Visits (Telemedicine).  Patients are able to view lab/test results, encounter notes, upcoming appointments, etc.  Non-urgent messages can be sent to your provider as well.   To learn more about what you can do with MyChart, go to [email or web address].    Your next appointment:   1 month(s)  The format for your next appointment:   In Person  Provider:   Belva Crome, MD    Other Instructions none  Important Information About Sugar

## 2022-12-05 NOTE — Progress Notes (Signed)
Cardiology Office Note:    Date:  12/05/2022   ID:  SELBY SLOVACEK, DOB [birth year], MRN [id number]  PCP:  Esperanza Richters, PA-C  Cardiologist:  Garwin Brothers, MD   Referring MD: Esperanza Richters, PA-C    ASSESSMENT:    1. Persistent atrial fibrillation (HCC)   2. Chronic anticoagulation   3. Cardiac murmur    PLAN:    In order of problems listed above:  Primary prevention stressed with the patient.  Importance of compliance with diet medication stressed and patient verbalized standing. Essential hypertension: Blood pressure stable and diet was emphasized.  Blood pressure was elevated when she came in.  Recheck pressure was fine.  It was 134/70.  Her blood pressures are better at home. Persistent atrial fibrillation:I discussed with the patient atrial fibrillation, disease process. Management and therapy including rate and rhythm control, anticoagulation benefits and potential risks were discussed extensively with the patient. Patient had multiple questions which were answered to patient's satisfaction. I reviewed her records extensively.  Last echo reveals left atrial size was unremarkable.  In view of this I discussed rhythm management and cardioversion.  She is agreeable.  I will do blood work today chest x-ray and echocardiogram.  If these are unremarkable I will schedule her for a follow-up visit and initiate her on amiodarone therapy and then get her on for electrical cardioversion if this does not restore sinus rhythm.  I discussed this at extensive length benefits and potential risks of amiodarone discussed and questions were answered to her satisfaction. Obesity: Weight reduction stressed diet emphasized and she promises to do better. Patient will be seen in follow-up appointment in 4 weeks or earlier if the patient has any concerns.    Medication Adjustments/Labs and Tests Ordered: Current medicines are reviewed at length with the patient today.  Concerns regarding  medicines are outlined above.  No orders of the defined types were placed in this encounter.  No orders of the defined types were placed in this encounter.    No chief complaint on file.    History of Present Illness:    [name] Brown is a 82 y.o. female.  Patient has past medical history of persistent atrial fibrillation.  She denies any problems at this time except dyspnea on exertion.  No chest pain orthopnea or PND.  At the time of my evaluation, the patient is alert awake oriented and in no distress.  She is previously unknown to me.  She sees my partner.  She has history of hypertension.  No history of dyslipidemia or diabetes mellitus.  Overall she is obese and leads a sedentary lifestyle.  Past Medical History:  Diagnosis Date   A-fib [facility])    Actinic keratosis 01/15/2007   Centricity Description: ACTINIC KERATOSIS  Qualifier: Diagnosis of   By: Thomos Lemons      Centricity Description: SOLAR KERATOSIS  Qualifier: Diagnosis of   By: Thomos Lemons       ACUTE BRONCHITIS 06/27/2010   Qualifier: Diagnosis of   By: Thomos Lemons       Anxiety state 03/20/2006   Qualifier: Diagnosis of   By: Cathey Endow DO, Karen       Chronic anticoagulation 03/20/2006   Qualifier: Diagnosis of   By: Thomos Lemons       Depression    DYSPNEA 04/23/2009   Qualifier: Diagnosis of   By: Kem Parkinson       Essential tremor    GERD 04/23/2009  Qualifier: Diagnosis of   By: Kem Parkinson       Hyperlipidemia    INSOMNIA, CHRONIC 07/08/2007   Qualifier: Diagnosis of   By: Cathey Endow DO, Karen       Major depressive disorder, recurrent episode (HCC) 03/20/2006   Qualifier: Diagnosis of   By: Thomos Lemons       Memory loss 08/09/2017   OBESITY, NOS 03/20/2006   Qualifier: Diagnosis of   By: Cathey Endow DO, Karen       Persistent atrial fibrillation (HCC) 01/06/2021   Secondary hypercoagulable state (HCC) 01/06/2021   Transient cerebral ischemia 07/21/2008   Qualifier: Diagnosis of   By:  Cathey Endow DO, Karen       Transient global amnesia 08/09/2017   Tremor 08/09/2017   UTI 04/28/2009   Qualifier: Diagnosis of   By: Thomos Lemons        Past Surgical History:  Procedure Laterality Date   ABDOMINAL HYSTERECTOMY     APPENDECTOMY     breast biopsies Bilateral    CARDIOVERSION N/A 02/15/2021   Procedure: CARDIOVERSION;  Surgeon: Little Ishikawa, MD;  Location: [facility] ENDOSCOPY;  Service: Cardiovascular;  Laterality: N/A;   CYST EXCISION N/A 07/10/2016   Procedure: 3CM CYST EXCISION TRUNK;  Surgeon: Franky Macho, MD;  Location: [facility] ORS;  Service: General;  Laterality: N/A;   FL INJ LEFT KNEE CT ARTHROGRAM ([facility] HX)     TONSILLECTOMY      Current Medications: Current Meds  Medication Sig   acetaminophen (TYLENOL) 500 MG tablet Take 1,500 mg by mouth daily as needed for moderate pain.   ALPRAZolam (XANAX) 0.5 MG tablet TAKE 1 TABLET BY MOUTH TWICE DAILY AS NEEDED FOR ANXIETY   apixaban (ELIQUIS) 5 MG TABS tablet TAKE 1 TABLET(5 MG) BY MOUTH TWICE DAILY   mirtazapine (REMERON) 30 MG tablet TAKE 1 TABLET(30 MG) BY MOUTH AT BEDTIME   Omega-3 Fatty Acids (FISH OIL) 1000 MG CAPS Take 2,000 mg by mouth at bedtime.   propranolol (INDERAL) 40 MG tablet TAKE 1 TABLET(40 MG) BY MOUTH TWICE DAILY     Allergies:   Penicillins, Cephalosporins, and Prochlorperazine edisylate   Social History   Socioeconomic History   Marital status: Widowed    Spouse name: Not on file   Number of children: 2   Years of education: college   Highest education level: Associate degree: academic program  Occupational History   Occupation: Retired Charity fundraiser  Tobacco Use   Smoking status: Never    Passive exposure: Never   Smokeless tobacco: Never  Vaping Use   Vaping Use: Never used  Substance and Sexual Activity   Alcohol use: Yes    Alcohol/week: 1.0 standard drink of alcohol    Types: 1 Glasses of wine per week    Comment: occasional glass of wine   Drug use: No   Sexual activity: Never  Other  Topics Concern   Not on file  Social History Narrative   Lives alone.   Right-handed.   Two children - 1 biological, 1 adopted.   Occasional use of caffeine.   Social Determinants of Health   Financial Resource Strain: Low Risk  (10/20/2022)   Overall Financial Resource Strain (CARDIA)    Difficulty of Paying Living Expenses: Not hard at all  Food Insecurity: No Food Insecurity (10/20/2022)   Hunger Vital Sign    Worried About Running Out of Food in the Last Year: Never true    Ran Out of Food in the Last  Year: Never true  Transportation Needs: No Transportation Needs (10/20/2022)   PRAPARE - Administrator, Civil Service (Medical): No    Lack of Transportation (Non-Medical): No  Physical Activity: Insufficiently Active (10/20/2022)   Exercise Vital Sign    Days of Exercise per Week: 3 days    Minutes of Exercise per Session: 20 min  Stress: No Stress Concern Present (10/20/2022)   [employer] of Occupational Health - Occupational Stress Questionnaire    Feeling of Stress : Only a little  Social Connections: Moderately Isolated (10/20/2022)   Social Connection and Isolation Panel [NHANES]    Frequency of Communication with Friends and Family: More than three times a week    Frequency of Social Gatherings with Friends and Family: More than three times a week    Attends Religious Services: More than 4 times per year    Active Member of [employer] or Organizations: No    Attends Banker Meetings: Not on file    Marital Status: Widowed     Family History: The patient's family history includes Atrial fibrillation in her father, mother, and sister; COPD in her mother; Lung cancer in her father.  ROS:   Please see the history of present illness.    All other systems reviewed and are negative.  EKGs/Labs/Other Studies Reviewed:    The following studies were reviewed today: EKG reveals atrial fibrillation with elevated ventricular rate.   Recent  Labs: 05/30/2022: ALT 18; BUN 18; Creatinine, Ser 1.09; Hemoglobin 15.2; Platelets 266.0; Potassium 5.0; Sodium 142; TSH 2.01  Recent Lipid Panel    Component Value Date/Time   CHOL 217 (H) 02/15/2022 0832   TRIG 309.0 (H) 02/15/2022 0832   HDL 36.90 (L) 02/15/2022 0832   CHOLHDL 6 02/15/2022 0832   VLDL 61.8 (H) 02/15/2022 0832   LDLCALC 152 (H) 05/18/2010 2005   LDLDIRECT 134.0 02/15/2022 0832    Physical Exam:    VS:  BP (!) 160/96   Pulse 90   Ht 5\' 7"  (1.702 m)   Wt 232 lb 1.9 oz (105.3 kg)   SpO2 96%   BMI 36.36 kg/m     Wt Readings from Last 3 Encounters:  12/05/22 232 lb 1.9 oz (105.3 kg)  11/13/22 236 lb (107 kg)  08/23/22 231 lb 9.6 oz (105.1 kg)     GEN: Patient is in no acute distress HEENT: Normal NECK: No JVD; No carotid bruits LYMPHATICS: No lymphadenopathy CARDIAC: Hear sounds regular, 2/6 systolic murmur at the apex. RESPIRATORY:  Clear to auscultation without rales, wheezing or rhonchi  ABDOMEN: Soft, non-tender, non-distended MUSCULOSKELETAL:  No edema; No deformity  SKIN: Warm and dry NEUROLOGIC:  Alert and oriented x 3 PSYCHIATRIC:  Normal affect   Signed, Garwin Brothers, MD  12/05/2022 3:00 PM     [facility]

## 2022-12-06 LAB — COMPREHENSIVE METABOLIC PANEL
ALT: 15 IU/L (ref 0–32)
AST: 17 IU/L (ref 0–40)
Albumin: 4.2 g/dL (ref 3.8–4.8)
Alkaline Phosphatase: 71 IU/L (ref 44–121)
BUN/Creatinine Ratio: 22 (ref 12–28)
BUN: 19 mg/dL (ref 8–27)
Bilirubin Total: 0.5 mg/dL (ref 0.0–1.2)
CO2: 24 mmol/L (ref 20–29)
Calcium: 9.3 mg/dL (ref 8.7–10.3)
Chloride: 105 mmol/L (ref 96–106)
Creatinine, Ser: 0.87 mg/dL (ref 0.57–1.00)
Globulin, Total: 2.8 g/dL (ref 1.5–4.5)
Glucose: 104 mg/dL — ABNORMAL HIGH (ref 70–99)
Potassium: 4.5 mmol/L (ref 3.5–5.2)
Sodium: 141 mmol/L (ref 134–144)
Total Protein: 7 g/dL (ref 6.0–8.5)
eGFR: 68 mL/min/{1.73_m2} (ref >59)

## 2022-12-06 LAB — CBC
Hematocrit: 44.5 % (ref 34.0–46.6)
Hemoglobin: 14.6 g/dL (ref 11.1–15.9)
MCH: 29.3 pg (ref 26.6–33.0)
MCHC: 32.8 g/dL (ref 31.5–35.7)
MCV: 89 fL (ref 79–97)
Platelets: 234 10*3/uL (ref 150–450)
RBC: 4.98 x10E6/uL (ref 3.77–5.28)
RDW: 13.7 % (ref 11.7–15.4)
WBC: 5.8 10*3/uL (ref 3.4–10.8)

## 2022-12-06 LAB — TSH: TSH: 1.77 u[IU]/mL (ref 0.450–4.500)

## 2022-12-12 DIAGNOSIS — N3000 Acute cystitis without hematuria: Secondary | ICD-10-CM | POA: Diagnosis not present

## 2023-01-01 DIAGNOSIS — N3000 Acute cystitis without hematuria: Secondary | ICD-10-CM | POA: Diagnosis not present

## 2023-01-10 ENCOUNTER — Ambulatory Visit (HOSPITAL_BASED_OUTPATIENT_CLINIC_OR_DEPARTMENT_OTHER)
Admission: RE | Admit: 2023-01-10 | Discharge: 2023-01-10 | Disposition: A | Discharge status: Home / Self Care | Payer: Medicare Other | Source: Ambulatory Visit | Attending: Cardiology | Admitting: Cardiology

## 2023-01-10 DIAGNOSIS — I4819 Other persistent atrial fibrillation: Secondary | ICD-10-CM | POA: Diagnosis not present

## 2023-01-10 LAB — ECHOCARDIOGRAM COMPLETE
AR max vel: 2.08 cm2
AV Area VTI: 1.86 cm2
AV Area mean vel: 1.97 cm2
AV Mean grad: 3 mmHg
AV Peak grad: 5.5 mmHg
AV Vena cont: 0.2 cm
Ao pk vel: 1.17 m/s
Area-P 1/2: 4.71 cm2
Calc EF: 59.2 %
MV M vel: 1.96 m/s
MV Peak grad: 15.3 mmHg
P 1/2 time: 2202 msec
S' Lateral: 2.9 cm
Single Plane A2C EF: 61.9 %
Single Plane A4C EF: 59.1 %

## 2023-01-11 ENCOUNTER — Ambulatory Visit: Payer: Self-pay | Admitting: Licensed Clinical Social Worker

## 2023-01-11 NOTE — Patient Instructions (Signed)
  It was a pleasure speaking with you today. Per your request a Care Coordination phone appointment is scheduled 01/22/23  Sammuel Hines, LCSW Social Work Care Coordination  [phone]

## 2023-01-11 NOTE — Patient Outreach (Signed)
  Care Coordination  Initial Visit Note   01/11/2023 Name: KELAIA INKS MRN: [id number] DOB: 01-Oct-1940  Encarnacion Slates is a 82 y.o. year old female who sees [name], [name], [place] for primary care. I spoke with  Encarnacion Slates by phone today.  What matters to the patients health and wellness today?    Patient scheduled phone appointment to obtain additional information about the Care Coordination Program..   SDOH assessments and interventions completed:  No   Care Coordination Interventions:  No, not indicated   Follow up plan: Follow up call scheduled for 01/22/23    Encounter Outcome:  Pt. Visit Completed   Sammuel Hines, LCSW Social Work Care Coordination  [facility] [employer] [employer] [phone]

## 2023-01-19 ENCOUNTER — Encounter: Payer: Self-pay | Admitting: Cardiology

## 2023-01-19 ENCOUNTER — Ambulatory Visit: Payer: Medicare Other | Attending: Cardiology | Admitting: Cardiology

## 2023-01-19 VITALS — BP 144/94 | HR 95 | Ht 67.6 in | Wt 232.1 lb

## 2023-01-19 DIAGNOSIS — I4819 Other persistent atrial fibrillation: Secondary | ICD-10-CM

## 2023-01-19 MED ORDER — AMIODARONE HCL 200 MG PO TABS
ORAL_TABLET | ORAL | 3 refills | Status: DC
Start: 2023-01-19 — End: 2023-02-08

## 2023-01-19 MED ORDER — AMLODIPINE BESYLATE 2.5 MG PO TABS
2.5000 mg | ORAL_TABLET | Freq: Every day | ORAL | 3 refills | Status: DC
Start: 2023-01-19 — End: 2023-07-30

## 2023-01-19 MED ORDER — AMIODARONE HCL 200 MG PO TABS
200.0000 mg | ORAL_TABLET | Freq: Every day | ORAL | 3 refills | Status: DC
Start: 2023-01-19 — End: 2023-01-19

## 2023-01-19 NOTE — Progress Notes (Signed)
Cardiology Office Note:    Date:  01/19/2023   ID:  [name] Brown, DOB 01-21-1941, MRN [id number]  PCP:  [name] Richters, PA-C  Cardiologist:  [name] Brothers, MD   Referring MD: [name] Richters, PA-C    ASSESSMENT:    1. Persistent atrial fibrillation (HCC)    PLAN:    In order of problems listed above:  Primary prevention stressed with the patient.  Importance of compliance with diet medication stressed and patient verbalized standing. Persistent atrial fibrillation:I discussed with the patient atrial fibrillation, disease process. Management and therapy including rate and rhythm control, anticoagulation benefits and potential risks were discussed extensively with the patient. Patient had multiple questions which were answered to patient's satisfaction. Amiodarone therapy: I discussed benefits risks with her and she vocalized understanding.  She is agreeable.  Will do baseline blood work again today.  Her chest x-ray baseline was fine.  Will initiate her on 200 mg daily for 2 weeks and then cut down to 200 mg daily.  She will be seen for follow-up appointment in 3 to 4 weeks.  At that time if she is not in sinus we will initiate cardioversion protocol.  She had multiple questions which were answered to satisfaction. Essential hypertension: She mentions to me that her blood pressure is elevated and similar at home I have initiated on amlodipine 2.5 mg.  She will keep a track of her vital signs and get back to [place] in a month. Obesity: Weight reduction stressed diet emphasized and she promises to do better.  Medication Adjustments/Labs and Tests Ordered: Current medicines are reviewed at length with the patient today.  Concerns regarding medicines are outlined above.  Orders Placed This Encounter  Procedures   EKG 12-Lead   No orders of the defined types were placed in this encounter.    No chief complaint on file.    History of Present Illness:    [name] Brown is a 82 y.o.  female.  Patient has past medical history of essential hypertension, persistent atrial fibrillation and obesity.  Overall she leads a sedentary lifestyle.  She mentions to me that if she exerts she has some shortness of breath otherwise she is fine.  No chest pain orthopnea or PND.  At the time of my evaluation, the patient is alert awake oriented and in no distress.  Past Medical History:  Diagnosis Date   A-fib [facility])    Actinic keratosis 01/15/2007   Centricity Description: ACTINIC KERATOSIS  Qualifier: Diagnosis of   By: Thomos Lemons      Centricity Description: SOLAR KERATOSIS  Qualifier: Diagnosis of   By: Thomos Lemons       ACUTE BRONCHITIS 06/27/2010   Qualifier: Diagnosis of   By: Thomos Lemons       Anxiety state 03/20/2006   Qualifier: Diagnosis of   By: Cathey Endow DO, Karen       Cardiac murmur 12/05/2022   Chronic anticoagulation 03/20/2006   Qualifier: Diagnosis of   By: Thomos Lemons       Depression    DYSPNEA 04/23/2009   Qualifier: Diagnosis of   By: Kem Parkinson       Essential tremor    GERD 04/23/2009   Qualifier: Diagnosis of   By: Kem Parkinson       Hyperlipidemia    INSOMNIA, CHRONIC 07/08/2007   Qualifier: Diagnosis of   By: Thomos Lemons       Major depressive disorder, recurrent episode (  HCC) 03/20/2006   Qualifier: Diagnosis of   By: Thomos Lemons       Memory loss 08/09/2017   OBESITY, NOS 03/20/2006   Qualifier: Diagnosis of   By: Cathey Endow DO, Karen       Persistent atrial fibrillation (HCC) 01/06/2021   Secondary hypercoagulable state (HCC) 01/06/2021   Transient cerebral ischemia 07/21/2008   Qualifier: Diagnosis of   By: Cathey Endow DO, Karen       Transient global amnesia 08/09/2017   Tremor 08/09/2017   UTI 04/28/2009   Qualifier: Diagnosis of   By: Thomos Lemons        Past Surgical History:  Procedure Laterality Date   ABDOMINAL HYSTERECTOMY     APPENDECTOMY     breast biopsies Bilateral    CARDIOVERSION N/A 02/15/2021    Procedure: CARDIOVERSION;  Surgeon: Little Ishikawa, MD;  Location: [facility] ENDOSCOPY;  Service: Cardiovascular;  Laterality: N/A;   CYST EXCISION N/A 07/10/2016   Procedure: 3CM CYST EXCISION TRUNK;  Surgeon: Franky Macho, MD;  Location: [facility] ORS;  Service: General;  Laterality: N/A;   FL INJ LEFT KNEE CT ARTHROGRAM ([facility] HX)     TONSILLECTOMY      Current Medications: Current Meds  Medication Sig   acetaminophen (TYLENOL) 500 MG tablet Take 1,500 mg by mouth daily as needed for moderate pain.   ALPRAZolam (XANAX) 0.5 MG tablet TAKE 1 TABLET BY MOUTH TWICE DAILY AS NEEDED FOR ANXIETY   apixaban (ELIQUIS) 5 MG TABS tablet TAKE 1 TABLET(5 MG) BY MOUTH TWICE DAILY   mirtazapine (REMERON) 30 MG tablet TAKE 1 TABLET(30 MG) BY MOUTH AT BEDTIME   Omega-3 Fatty Acids (FISH OIL) 1000 MG CAPS Take 2,000 mg by mouth at bedtime.   propranolol (INDERAL) 40 MG tablet TAKE 1 TABLET(40 MG) BY MOUTH TWICE DAILY     Allergies:   Penicillins, Cephalosporins, and Prochlorperazine edisylate   Social History   Socioeconomic History   Marital status: Widowed    Spouse name: Not on file   Number of children: 2   Years of education: college   Highest education level: Associate degree: academic program  Occupational History   Occupation: Retired Charity fundraiser  Tobacco Use   Smoking status: Never    Passive exposure: Never   Smokeless tobacco: Never  Vaping Use   Vaping status: Never Used  Substance and Sexual Activity   Alcohol use: Yes    Alcohol/week: 1.0 standard drink of alcohol    Types: 1 Glasses of wine per week    Comment: occasional glass of wine   Drug use: No   Sexual activity: Never  Other Topics Concern   Not on file  Social History Narrative   Lives alone.   Right-handed.   Two children - 1 biological, 1 adopted.   Occasional use of caffeine.   Social Determinants of Health   Financial Resource Strain: Low Risk  (10/20/2022)   Overall Financial Resource Strain (CARDIA)    Difficulty  of Paying Living Expenses: Not hard at all  Food Insecurity: No Food Insecurity (10/20/2022)   Hunger Vital Sign    Worried About Running Out of Food in the Last Year: Never true    Ran Out of Food in the Last Year: Never true  Transportation Needs: No Transportation Needs (10/20/2022)   PRAPARE - Administrator, Civil Service (Medical): No    Lack of Transportation (Non-Medical): No  Physical Activity: Insufficiently Active (10/20/2022)   Exercise Vital Sign  Days of Exercise per Week: 3 days    Minutes of Exercise per Session: 20 min  Stress: No Stress Concern Present (10/20/2022)   [employer] of Occupational Health - Occupational Stress Questionnaire    Feeling of Stress : Only a little  Social Connections: Moderately Isolated (10/20/2022)   Social Connection and Isolation Panel [NHANES]    Frequency of Communication with Friends and Family: More than three times a week    Frequency of Social Gatherings with Friends and Family: More than three times a week    Attends Religious Services: More than 4 times per year    Active Member of [employer] or Organizations: No    Attends Banker Meetings: Not on file    Marital Status: Widowed     Family History: The patient's family history includes Atrial fibrillation in her father, mother, and sister; COPD in her mother; Lung cancer in her father.  ROS:   Please see the history of present illness.    All other systems reviewed and are negative.  EKGs/Labs/Other Studies Reviewed:    The following studies were reviewed today: .Marland KitchenEKG Interpretation Date/Time:  Friday January 19 2023 12:48:27 EDT Ventricular Rate:  95 PR Interval:    QRS Duration:  84 QT Interval:  362 QTC Calculation: 454 R Axis:   -33  Text Interpretation: Atrial fibrillation Left axis deviation Possible Anterior infarct , age undetermined When compared with ECG of 24-Jan-2021 12:05, QRS axis Shifted left Confirmed by Belva Crome [phone])  on 01/19/2023 1:09:23 PM     Recent Labs: 12/05/2022: ALT 15; BUN 19; Creatinine, Ser 0.87; Hemoglobin 14.6; Platelets 234; Potassium 4.5; Sodium 141; TSH 1.770  Recent Lipid Panel    Component Value Date/Time   CHOL 217 (H) 02/15/2022 0832   TRIG 309.0 (H) 02/15/2022 0832   HDL 36.90 (L) 02/15/2022 0832   CHOLHDL 6 02/15/2022 0832   VLDL 61.8 (H) 02/15/2022 0832   LDLCALC 152 (H) 05/18/2010 2005   LDLDIRECT 134.0 02/15/2022 0832    Physical Exam:    VS:  BP (!) 144/94   Pulse 95   Ht 5' 7.6" (1.717 m)   Wt 232 lb 1.3 oz (105.3 kg)   SpO2 96%   BMI 35.71 kg/m     Wt Readings from Last 3 Encounters:  01/19/23 232 lb 1.3 oz (105.3 kg)  12/05/22 232 lb 1.9 oz (105.3 kg)  11/13/22 236 lb (107 kg)     GEN: Patient is in no acute distress HEENT: Normal NECK: No JVD; No carotid bruits LYMPHATICS: No lymphadenopathy CARDIAC: Hear sounds regular, 2/6 systolic murmur at the apex. RESPIRATORY:  Clear to auscultation without rales, wheezing or rhonchi  ABDOMEN: Soft, non-tender, non-distended MUSCULOSKELETAL:  No edema; No deformity  SKIN: Warm and dry NEUROLOGIC:  Alert and oriented x 3 PSYCHIATRIC:  Normal affect   Signed, [name] Brothers, MD  01/19/2023 1:09 PM    [facility]

## 2023-01-19 NOTE — Patient Instructions (Signed)
Medication Instructions:  Your physician has recommended you make the following change in your medication:   Start Amiodarone 400 mg (2 tablets) daily for 2 weeks then decrease to 200 mg (1 tablet) daily.  Start Amlodipine 2.5 mg daily.  *If you need a refill on your cardiac medications before your next appointment, please call your pharmacy*   Lab Work: Your physician recommends that you have a CMP today in the office.  If you have labs (blood work) drawn today and your tests are completely normal, you will receive your results only by: MyChart Message (if you have MyChart) OR A paper copy in the mail If you have any lab test that is abnormal or we need to change your treatment, we will call you to review the results.   Testing/Procedures: None ordered   Follow-Up: At [facility], you and your health needs are our priority.  As part of our continuing mission to provide you with exceptional heart care, we have created designated Provider Care Teams.  These Care Teams include your primary Cardiologist (physician) and Advanced Practice Providers (APPs -  Physician Assistants and Nurse Practitioners) who all work together to provide you with the care you need, when you need it.  We recommend signing up for the patient portal called "MyChart".  Sign up information is provided on this After Visit Summary.  MyChart is used to connect with patients for Virtual Visits (Telemedicine).  Patients are able to view lab/test results, encounter notes, upcoming appointments, etc.  Non-urgent messages can be sent to your provider as well.   To learn more about what you can do with MyChart, go to [email or web address].    Your next appointment:   3-4 week(s)  The format for your next appointment:   In Person  Provider:   Belva Crome, MD    Other Instructions none  Important Information About Sugar

## 2023-01-22 ENCOUNTER — Ambulatory Visit: Payer: Self-pay | Admitting: Licensed Clinical Social Worker

## 2023-01-22 NOTE — Patient Outreach (Signed)
  Care Coordination  Initial Visit Note   01/22/2023 Name: MERIWETHER SHNAYDER MRN: [id number] DOB: [birth year]  Encarnacion Slates is a 82 y.o. year old female who sees [name], [name], [place] for primary care. I spoke with  Encarnacion Slates by phone today.  What matters to the patients health and wellness today?    Patient reports no concerns or needs from Care Coordination team with health and wellness related to physical or mental heath. .   SDOH assessments and interventions completed: recently completed and patient reports no changes No  Care Coordination Interventions:  Yes, provided  Interventions Today    Flowsheet Row Most Recent Value  General Interventions   General Interventions Discussed/Reviewed General Interventions Reviewed  Mental Health Interventions   Mental Health Discussed/Reviewed Mental Health Reviewed  Nutrition Interventions   Nutrition Discussed/Reviewed Nutrition Reviewed  Pharmacy Interventions   Pharmacy Dicussed/Reviewed Pharmacy Topics Reviewed  Advanced Directive Interventions   Advanced Directives Discussed/Reviewed --  [has document]       Follow up plan: No further intervention required.   Encounter Outcome:  Pt. Visit Completed   Sammuel Hines, LCSW Social Work Care Coordination  [facility] [employer] [phone]

## 2023-01-22 NOTE — Patient Instructions (Signed)
Social Work Visit Information  Thank you for taking time to visit with me today. Please don't hesitate to contact me if I can be of assistance to you.    Care Coordination provides support specific to your health needs that extend beyond exceptional routine office care you already receive from your primary care doctor.   There is no cost to you for Care Coordination.  The Care Coordination team is made up of the following team members: Registered Nurse Care Manager: disease management, health education, care coordination and complex case management Clinical Social Work: Complex Care Coordination including coordination of level of care needs, mental and behavioral health assessment and recommendations, and connection to long-term mental health support Clinical Pharmacist: medication management, assistance and disease management Community Resource Care Guides: community resource connections  Please call [phone] if you would like to schedule a phone appointment with one of the team members.    Sammuel Hines, LCSW Social Work Care Coordination  [facility] [employer] [phone]

## 2023-01-23 ENCOUNTER — Encounter: Payer: Self-pay | Admitting: Medical

## 2023-01-23 ENCOUNTER — Other Ambulatory Visit: Payer: Self-pay | Admitting: Medical

## 2023-01-23 DIAGNOSIS — H2512 Age-related nuclear cataract, left eye: Secondary | ICD-10-CM | POA: Diagnosis not present

## 2023-01-23 DIAGNOSIS — H25013 Cortical age-related cataract, bilateral: Secondary | ICD-10-CM | POA: Diagnosis not present

## 2023-01-23 DIAGNOSIS — H2513 Age-related nuclear cataract, bilateral: Secondary | ICD-10-CM | POA: Diagnosis not present

## 2023-01-23 DIAGNOSIS — H18413 Arcus senilis, bilateral: Secondary | ICD-10-CM | POA: Diagnosis not present

## 2023-01-23 DIAGNOSIS — H25043 Posterior subcapsular polar age-related cataract, bilateral: Secondary | ICD-10-CM | POA: Diagnosis not present

## 2023-01-23 NOTE — Telephone Encounter (Signed)
Requesting: xanax Contract:09/15/22 UDS:08/23/22 Last Visit:11/13/22 Next Visit:02/19/23 Last Refill:09/25/22  Please Advise

## 2023-01-24 ENCOUNTER — Encounter: Payer: Self-pay | Admitting: Medical

## 2023-01-24 DIAGNOSIS — I4819 Other persistent atrial fibrillation: Secondary | ICD-10-CM | POA: Diagnosis not present

## 2023-01-24 NOTE — Telephone Encounter (Signed)
Requesting: xanax Contract:09/15/22 UDS:08/23/22 Last Visit:11/13/22 Next Visit:02/19/23 Last Refill:09/25/22  Please Advise

## 2023-01-25 ENCOUNTER — Encounter (INDEPENDENT_AMBULATORY_CARE_PROVIDER_SITE_OTHER): Payer: Self-pay

## 2023-01-25 ENCOUNTER — Other Ambulatory Visit: Payer: Self-pay | Admitting: Medical

## 2023-01-25 ENCOUNTER — Encounter: Payer: Self-pay | Admitting: Medical

## 2023-01-25 ENCOUNTER — Telehealth: Payer: Self-pay

## 2023-01-25 DIAGNOSIS — E875 Hyperkalemia: Secondary | ICD-10-CM

## 2023-01-25 MED ORDER — ALPRAZOLAM 0.5 MG PO TABS: ORAL_TABLET | ORAL | 0 refills | Status: DC

## 2023-01-25 MED ORDER — SODIUM POLYSTYRENE SULFONATE PO POWD
Freq: Once | ORAL | 0 refills | Status: AC
Start: 2023-01-25 — End: 2023-01-25

## 2023-01-25 NOTE — Telephone Encounter (Signed)
-----   Message from Clarkston R Revankar sent at 01/25/2023  8:51 AM EDT ----- Kayexalate 15 g 1 dose and recheck in 1 week.  Diet low in potassium.  Copy primary care Garwin Brothers, MD 01/25/2023 8:51 AM

## 2023-01-25 NOTE — Addendum Note (Signed)
Addended by: Gwenevere Abbot on: 01/25/2023 06:29 PM   Modules accepted: Orders

## 2023-01-25 NOTE — Telephone Encounter (Signed)
Results reviewed with pt as per Dr. Revankar's note.  Pt verbalized understanding and had no additional questions. Routed to PCP.  

## 2023-01-26 NOTE — Telephone Encounter (Signed)
This request was from 3 days ago , pt was notified of refill

## 2023-01-26 NOTE — Telephone Encounter (Signed)
Xanax refilled yesterday.

## 2023-01-26 NOTE — Telephone Encounter (Signed)
Refill request from 2 days , pt already notified of script

## 2023-01-27 NOTE — Telephone Encounter (Signed)
Please don't let another request for xanax come to me. I have dealt with about 6 request in the last 2 days after I already filled.

## 2023-02-05 DIAGNOSIS — E875 Hyperkalemia: Secondary | ICD-10-CM | POA: Diagnosis not present

## 2023-02-06 LAB — BASIC METABOLIC PANEL
BUN/Creatinine Ratio: 19 (ref 12–28)
BUN: 18 mg/dL (ref 8–27)
CO2: 24 mmol/L (ref 20–29)
Calcium: 9.2 mg/dL (ref 8.7–10.3)
Chloride: 105 mmol/L (ref 96–106)
Creatinine, Ser: 0.95 mg/dL (ref 0.57–1.00)
Glucose: 109 mg/dL — ABNORMAL HIGH (ref 70–99)
Potassium: 5 mmol/L (ref 3.5–5.2)
Sodium: 144 mmol/L (ref 134–144)
eGFR: 61 mL/min/{1.73_m2} (ref >59)

## 2023-02-07 ENCOUNTER — Telehealth: Payer: Self-pay | Admitting: Cardiology

## 2023-02-07 DIAGNOSIS — I4819 Other persistent atrial fibrillation: Secondary | ICD-10-CM

## 2023-02-07 NOTE — Telephone Encounter (Signed)
Patient wants provider switch from Dr. Tomie China in [facility] to Dr. Tenny Craw in [place].  Please confirm.

## 2023-02-07 NOTE — Telephone Encounter (Signed)
Pt c/o medication issue:  1. Name of Medication:   amiodarone (PACERONE) 200 MG tablet   2. How are you currently taking this medication (dosage and times per day)?   As prescribed  3. Are you having a reaction (difficulty breathing--STAT)?   Really tired, severe muscle and joint aches  4. What is your medication issue?    Patient stated she wants to know if she still needs to stay on this medication.

## 2023-02-08 NOTE — Telephone Encounter (Signed)
Recommendations reviewed with pt as per Revankar's note.  Pt verbalized understanding and had no additional questions.

## 2023-02-08 NOTE — Telephone Encounter (Signed)
Patient stated she has been put on amiodarone (PACERONE) 200 MG tablet and was supposed to be on for 2 weeks at 2 tablets per day and she is now on 1 tablet per day.  Patient wants Dr. Tenny Craw to let her know if she should continue on this medication.

## 2023-02-09 ENCOUNTER — Other Ambulatory Visit: Payer: Self-pay | Admitting: Medical

## 2023-02-13 ENCOUNTER — Ambulatory Visit: Payer: Medicare Other | Admitting: Medical

## 2023-02-14 DIAGNOSIS — I4819 Other persistent atrial fibrillation: Secondary | ICD-10-CM | POA: Diagnosis not present

## 2023-02-15 LAB — COMPREHENSIVE METABOLIC PANEL
ALT: 17 IU/L (ref 0–32)
AST: 19 IU/L (ref 0–40)
Albumin: 4.1 g/dL (ref 3.8–4.8)
Alkaline Phosphatase: 69 IU/L (ref 44–121)
BUN/Creatinine Ratio: 23 (ref 12–28)
BUN: 23 mg/dL (ref 8–27)
Bilirubin Total: 0.4 mg/dL (ref 0.0–1.2)
CO2: 24 mmol/L (ref 20–29)
Calcium: 9.4 mg/dL (ref 8.7–10.3)
Chloride: 107 mmol/L — ABNORMAL HIGH (ref 96–106)
Creatinine, Ser: 1.01 mg/dL — ABNORMAL HIGH (ref 0.57–1.00)
Globulin, Total: 2.9 g/dL (ref 1.5–4.5)
Glucose: 121 mg/dL — ABNORMAL HIGH (ref 70–99)
Potassium: 5 mmol/L (ref 3.5–5.2)
Sodium: 144 mmol/L (ref 134–144)
Total Protein: 7 g/dL (ref 6.0–8.5)
eGFR: 56 mL/min/{1.73_m2} — ABNORMAL LOW (ref >59)

## 2023-02-15 LAB — TSH: TSH: 4.66 u[IU]/mL — ABNORMAL HIGH (ref 0.450–4.500)

## 2023-02-19 ENCOUNTER — Ambulatory Visit: Payer: Medicare Other | Admitting: Medical

## 2023-02-21 ENCOUNTER — Ambulatory Visit: Payer: Medicare Other | Admitting: Medical

## 2023-02-23 ENCOUNTER — Ambulatory Visit (INDEPENDENT_AMBULATORY_CARE_PROVIDER_SITE_OTHER): Payer: Medicare Other | Admitting: Medical

## 2023-02-23 VITALS — BP 117/74 | HR 73 | Temp 97.8°F | Resp 18 | Ht 68.0 in | Wt 234.8 lb

## 2023-02-23 DIAGNOSIS — R739 Hyperglycemia, unspecified: Secondary | ICD-10-CM | POA: Diagnosis not present

## 2023-02-23 DIAGNOSIS — I10 Essential (primary) hypertension: Secondary | ICD-10-CM

## 2023-02-23 DIAGNOSIS — R911 Solitary pulmonary nodule: Secondary | ICD-10-CM

## 2023-02-23 DIAGNOSIS — R7989 Other specified abnormal findings of blood chemistry: Secondary | ICD-10-CM | POA: Diagnosis not present

## 2023-02-23 DIAGNOSIS — F411 Generalized anxiety disorder: Secondary | ICD-10-CM | POA: Diagnosis not present

## 2023-02-23 DIAGNOSIS — E785 Hyperlipidemia, unspecified: Secondary | ICD-10-CM | POA: Diagnosis not present

## 2023-02-23 DIAGNOSIS — R918 Other nonspecific abnormal finding of lung field: Secondary | ICD-10-CM

## 2023-02-23 LAB — TSH: TSH: 1.57 u[IU]/mL (ref 0.35–5.50)

## 2023-02-23 LAB — T4, FREE: Free T4: 0.91 ng/dL (ref 0.60–1.60)

## 2023-02-23 LAB — HEMOGLOBIN A1C: Hgb A1c MFr Bld: 5.9 % (ref 4.6–6.5)

## 2023-02-23 MED ORDER — ALPRAZOLAM 0.5 MG PO TABS: ORAL_TABLET | ORAL | 0 refills | Status: DC

## 2023-02-23 NOTE — Progress Notes (Signed)
Subjective:    Patient ID: [name] Brown, female    DOB: [birth year], 82 y.o.   MRN: [id number]  HPI  Pt in for follow up.  Pt states she was on amiodorone for short period of 2 weeks. Her potassium went up briefly and her tsh went up. Recent new cardiologist stopped amiodorone.   I had saw labs and wanted her to follow up with me. No family history of hypothyroid.    Has anxiety- Xanax (last CSC was 09/15/22, UDS was 08/22/22). No side effects reported. Controlled by med.  Elevated sugar but on review no hx of A1c 6.5 or more that I can see.    Elevated cholesterol- In past. Pt declined cholesterol meds in past.   Bp well controlled today.   Review of Systems  Constitutional:  Negative for chills, fatigue and fever.  HENT:  Negative for congestion and facial swelling.   Respiratory:  Negative for cough, chest tightness, shortness of breath and wheezing.   Cardiovascular:  Negative for chest pain and palpitations.  Gastrointestinal:  Negative for abdominal pain.  Genitourinary:  Negative for dysuria.  Musculoskeletal:  Negative for back pain.  Skin:  Negative for rash.  Neurological:  Negative for dizziness, speech difficulty and headaches.  Psychiatric/Behavioral:  Negative for behavioral problems. The patient is not nervous/anxious.     Past Medical History:  Diagnosis Date   A-fib [facility])    Actinic keratosis 01/15/2007   Centricity Description: ACTINIC KERATOSIS  Qualifier: Diagnosis of   By: Thomos Lemons      Centricity Description: SOLAR KERATOSIS  Qualifier: Diagnosis of   By: Thomos Lemons       ACUTE BRONCHITIS 06/27/2010   Qualifier: Diagnosis of   By: Thomos Lemons       Anxiety state 03/20/2006   Qualifier: Diagnosis of   By: Cathey Endow DO, Karen       Cardiac murmur 12/05/2022   Chronic anticoagulation 03/20/2006   Qualifier: Diagnosis of   By: Thomos Lemons       Depression    DYSPNEA 04/23/2009   Qualifier: Diagnosis of   By: Kem Parkinson        Essential tremor    GERD 04/23/2009   Qualifier: Diagnosis of   By: Kem Parkinson       Hyperlipidemia    INSOMNIA, CHRONIC 07/08/2007   Qualifier: Diagnosis of   By: Cathey Endow DO, Karen       Major depressive disorder, recurrent episode (HCC) 03/20/2006   Qualifier: Diagnosis of   By: Thomos Lemons       Memory loss 08/09/2017   OBESITY, NOS 03/20/2006   Qualifier: Diagnosis of   By: Cathey Endow DO, Karen       Persistent atrial fibrillation (HCC) 01/06/2021   Secondary hypercoagulable state (HCC) 01/06/2021   Transient cerebral ischemia 07/21/2008   Qualifier: Diagnosis of   By: Cathey Endow DO, Karen       Transient global amnesia 08/09/2017   Tremor 08/09/2017   UTI 04/28/2009   Qualifier: Diagnosis of   By: Thomos Lemons         Social History   Socioeconomic History   Marital status: Widowed    Spouse name: Not on file   Number of children: 2   Years of education: college   Highest education level: Associate degree: academic program  Occupational History   Occupation: Retired Charity fundraiser  Tobacco Use   Smoking status: Never    Passive exposure:  Never   Smokeless tobacco: Never  Vaping Use   Vaping status: Never Used  Substance and Sexual Activity   Alcohol use: Yes    Alcohol/week: 1.0 standard drink of alcohol    Types: 1 Glasses of wine per week    Comment: occasional glass of wine   Drug use: No   Sexual activity: Never  Other Topics Concern   Not on file  Social History Narrative   Lives alone.   Right-handed.   Two children - 1 biological, 1 adopted.   Occasional use of caffeine.   Social Determinants of Health   Financial Resource Strain: Low Risk  (10/20/2022)   Overall Financial Resource Strain (CARDIA)    Difficulty of Paying Living Expenses: Not hard at all  Food Insecurity: No Food Insecurity (10/20/2022)   Hunger Vital Sign    Worried About Running Out of Food in the Last Year: Never true    Ran Out of Food in the Last Year: Never true  Transportation  Needs: No Transportation Needs (10/20/2022)   PRAPARE - Administrator, Civil Service (Medical): No    Lack of Transportation (Non-Medical): No  Physical Activity: Insufficiently Active (10/20/2022)   Exercise Vital Sign    Days of Exercise per Week: 3 days    Minutes of Exercise per Session: 20 min  Stress: No Stress Concern Present (10/20/2022)   [employer] of Occupational Health - Occupational Stress Questionnaire    Feeling of Stress : Only a little  Social Connections: Moderately Isolated (10/20/2022)   Social Connection and Isolation Panel [NHANES]    Frequency of Communication with Friends and Family: More than three times a week    Frequency of Social Gatherings with Friends and Family: More than three times a week    Attends Religious Services: More than 4 times per year    Active Member of [employer] or Organizations: No    Attends Banker Meetings: Not on file    Marital Status: Widowed  Intimate Partner Violence: Not At Risk (07/07/2022)   Humiliation, Afraid, Rape, and Kick questionnaire    Fear of Current or Ex-Partner: No    Emotionally Abused: No    Physically Abused: No    Sexually Abused: No    Past Surgical History:  Procedure Laterality Date   ABDOMINAL HYSTERECTOMY     APPENDECTOMY     breast biopsies Bilateral    CARDIOVERSION N/A 02/15/2021   Procedure: CARDIOVERSION;  Surgeon: Little Ishikawa, MD;  Location: [facility] ENDOSCOPY;  Service: Cardiovascular;  Laterality: N/A;   CYST EXCISION N/A 07/10/2016   Procedure: 3CM CYST EXCISION TRUNK;  Surgeon: Franky Macho, MD;  Location: [facility] ORS;  Service: General;  Laterality: N/A;   FL INJ LEFT KNEE CT ARTHROGRAM ([facility] HX)     TONSILLECTOMY      Family History  Problem Relation Age of Onset   Atrial fibrillation Mother    COPD Mother    Atrial fibrillation Father    Lung cancer Father    Atrial fibrillation Sister     Allergies  Allergen Reactions   Penicillins Anaphylaxis    Has  patient had a PCN reaction causing immediate rash, facial/tongue/throat swelling, SOB or lightheadedness with hypotension: Yes Has patient had a PCN reaction causing severe rash involving mucus membranes or skin necrosis: No Has patient had a PCN reaction that required hospitalization Yes Has patient had a PCN reaction occurring within the last 10 years: Yes If all of the  above answers are "NO", then may proceed with Cephalosporin use.    Cephalosporins Other (See Comments)    Due to allergic reaction to penicillin   Prochlorperazine Edisylate     REACTION: draws mouth to side    Current Outpatient Medications on File Prior to Visit  Medication Sig Dispense Refill   acetaminophen (TYLENOL) 500 MG tablet Take 1,500 mg by mouth daily as needed for moderate pain.     ALPRAZolam (XANAX) 0.5 MG tablet TAKE 1 TABLET BY MOUTH TWICE DAILY AS NEEDED FOR ANXIETY 60 tablet 0   apixaban (ELIQUIS) 5 MG TABS tablet TAKE 1 TABLET(5 MG) BY MOUTH TWICE DAILY 60 tablet 11   mirtazapine (REMERON) 30 MG tablet TAKE 1 TABLET(30 MG) BY MOUTH AT BEDTIME 30 tablet 2   Omega-3 Fatty Acids (FISH OIL) 1000 MG CAPS Take 2,000 mg by mouth at bedtime.     propranolol (INDERAL) 40 MG tablet TAKE 1 TABLET(40 MG) BY MOUTH TWICE DAILY 180 tablet 0   amLODipine (NORVASC) 2.5 MG tablet Take 1 tablet (2.5 mg total) by mouth daily. (Patient not taking: Reported on 02/23/2023) 180 tablet 3   No current facility-administered medications on file prior to visit.    BP 117/74 (BP Location: Left Arm, Patient Position: Sitting, Cuff Size: Large)   Pulse 73   Temp 97.8 F (36.6 C) (Temporal)   Resp 18   Ht 5\' 8"  (1.727 m)   Wt 234 lb 12.8 oz (106.5 kg)   SpO2 97%   BMI 35.70 kg/m        Objective:   Physical Exam  General Mental Status- Alert. General Appearance- Not in acute distress.   Skin General: Color- Normal Color. Moisture- Normal Moisture.  Neck Carotid Arteries- Normal color. Moisture- Normal Moisture. No  carotid bruits. No JVD.  Chest and Lung Exam Auscultation: Breath Sounds:-Normal.  Cardiovascular Auscultation:Rythm- Regular. Murmurs & Other Heart Sounds:Auscultation of the heart reveals- No Murmurs.  Abdomen Inspection:-Inspeection Normal. Palpation/Percussion:Note:No mass. Palpation and Percussion of the abdomen reveal- Non Tender, Non Distended + BS, no rebound or guarding.   Neurologic Cranial Nerve exam:- CN III-XII intact(No nystagmus), symmetric smile. Strength:- 5/5 equal and symmetric strength both upper and lower extremities.       Assessment & Plan:   Patient Instructions  1. Anxiety state -up to date on uds and contract. Xanax controlled anxiety with no reported side effects.  2. Hypertension, unspecified type -bp well controlled and not on amlodipine.   3. Hyperlipidemia, unspecified hyperlipidemia type -low cholesterol diet. Discussed med and you declined. At you age often time don't start. Some potential side effects.  4. Elevated blood sugar  - advise low sugar diet. -get A1c today.  5. Tsh elevation recently. Maybe amiodorone side effect. -repeat thyroid labs today.  Follow up date to be determined after lab review.   Esperanza Richters, PA-C

## 2023-02-23 NOTE — Patient Instructions (Addendum)
1. Anxiety state -up to date on uds and contract. Xanax controlled anxiety with no reported side effects.  2. Hypertension, unspecified type -bp well controlled and not on amlodipine.   3. Hyperlipidemia, unspecified hyperlipidemia type -low cholesterol diet. Discussed med and you declined. At you age often time don't start. Some potential side effects.  4. Elevated blood sugar  - advise low sugar diet. -get A1c today.  5. Tsh elevation recently. Maybe amiodorone side effect. -repeat thyroid labs today.  Follow up date to be determined after lab review.  Pt state will get flu vaccine at pharmacy.

## 2023-02-26 LAB — THYROID PEROXIDASE ANTIBODY: Thyroperoxidase Ab SerPl-aCnc: 190 IU/mL — ABNORMAL HIGH (ref <9)

## 2023-02-28 ENCOUNTER — Ambulatory Visit: Payer: Medicare Other | Admitting: Cardiology

## 2023-03-24 ENCOUNTER — Other Ambulatory Visit: Payer: Self-pay | Admitting: Medical

## 2023-03-26 NOTE — Telephone Encounter (Signed)
Requesting: alprazolam 0.5mg   Contract: 08/23/22 UDS: 08/23/22 Last Visit: 02/23/23 Next Visit: None Last Refill: 02/23/23 #60 and 0RF    Please Advise

## 2023-03-27 ENCOUNTER — Other Ambulatory Visit: Payer: Self-pay | Admitting: Medical

## 2023-03-28 ENCOUNTER — Encounter: Payer: Self-pay | Admitting: Medical

## 2023-03-28 NOTE — Telephone Encounter (Signed)
Duplicate request

## 2023-03-28 NOTE — Telephone Encounter (Signed)
Requesting: xanax Contract:09/15/22 UDS:08/23/22 Last Visit:02/23/23 Next Visit:n/a Last Refill:02/23/23  Please Advise

## 2023-03-29 MED ORDER — ALPRAZOLAM 0.5 MG PO TABS: ORAL_TABLET | ORAL | 2 refills | Status: DC

## 2023-03-29 NOTE — Telephone Encounter (Signed)
Rx xanax refill sent to pt pharmacy.

## 2023-03-30 ENCOUNTER — Other Ambulatory Visit: Payer: Self-pay | Admitting: Medical

## 2023-04-20 ENCOUNTER — Other Ambulatory Visit: Payer: Self-pay | Admitting: Medical

## 2023-05-22 ENCOUNTER — Ambulatory Visit: Payer: Medicare Other | Attending: Cardiology | Admitting: Internal Medicine

## 2023-05-22 ENCOUNTER — Ambulatory Visit (INDEPENDENT_AMBULATORY_CARE_PROVIDER_SITE_OTHER): Payer: Medicare Other

## 2023-05-22 ENCOUNTER — Encounter: Payer: Self-pay | Admitting: Internal Medicine

## 2023-05-22 VITALS — BP 142/82 | HR 103 | Ht 68.0 in | Wt 233.6 lb

## 2023-05-22 DIAGNOSIS — R0609 Other forms of dyspnea: Secondary | ICD-10-CM

## 2023-05-22 DIAGNOSIS — E875 Hyperkalemia: Secondary | ICD-10-CM | POA: Diagnosis not present

## 2023-05-22 DIAGNOSIS — I4819 Other persistent atrial fibrillation: Secondary | ICD-10-CM

## 2023-05-22 NOTE — Patient Instructions (Addendum)
Medication Instructions:   *If you need a refill on your cardiac medications before your next appointment, please call your pharmacy*   Lab Work:  If you have labs (blood work) drawn today and your tests are completely normal, you will receive your results only by: MyChart Message (if you have MyChart) OR A paper copy in the mail If you have any lab test that is abnormal or we need to change your treatment, we will call you to review the results.   Testing/Procedures:  [name]- Long Term Monitor Instructions  Your physician has requested you wear a ZIO patch monitor for 3 days.  This is a single patch monitor. [name] [facility] supplies one patch monitor per enrollment. Additional stickers are not available. Please do not apply patch if you will be having a Nuclear Stress Test,  Echocardiogram, Cardiac CT, MRI, or Chest Xray during the period you would be wearing the  monitor. The patch cannot be worn during these tests. You cannot remove and re-apply the  ZIO XT patch monitor.  Your ZIO patch monitor will be mailed 3 day USPS to your address on file. It may take 3-5 days  to receive your monitor after you have been enrolled.  Once you have received your monitor, please review the enclosed instructions. Your monitor  has already been registered assigning a specific monitor serial # to you.  Billing and Patient Assistance Program Information  We have supplied [name] with any of your insurance information on file for billing purposes. [name] offers a sliding scale Patient Assistance Program for patients that do not have  insurance, or whose insurance does not completely cover the cost of the ZIO monitor.  You must apply for the Patient Assistance Program to qualify for this discounted rate.  To apply, please call [name] at [phone], select option 4, select option 2, ask to apply for  Patient Assistance Program. [name] Brown will ask your household income, and how many people  are in your  household. They will quote your out-of-pocket cost based on that information.  [name] will also be able to set up a 71-month, interest-free payment plan if needed.  Applying the monitor   Shave hair from upper left chest.  Hold abrader disc by orange tab. Rub abrader in 40 strokes over the upper left chest as  indicated in your monitor instructions.  Clean area with 4 enclosed alcohol pads. Let dry.  Apply patch as indicated in monitor instructions. Patch will be placed under collarbone on left  side of chest with arrow pointing upward.  Rub patch adhesive wings for 2 minutes. Remove white label marked "1". Remove the white  label marked "2". Rub patch adhesive wings for 2 additional minutes.  While looking in a mirror, press and release button in Brown of patch. A small green light will  flash 3-4 times. This will be your only indicator that the monitor has been turned on.  Do not shower for the first 24 hours. You may shower after the first 24 hours.  Press the button if you feel a symptom. You will hear a small click. Record Date, Time and  Symptom in the Patient Logbook.  When you are ready to remove the patch, follow instructions on the last 2 pages of Patient  Logbook. Stick patch monitor onto the last page of Patient Logbook.  Place Patient Logbook in the blue and white box. Use locking tab on box and tape box closed  securely. The blue and white box has  prepaid postage on it. Please place it in the mailbox as  soon as possible. Your physician should have your test results approximately 7 days after the  monitor has been mailed back to [name].  Call [name] Customer Care at [phone] if you have questions regarding  your ZIO XT patch monitor. Call them immediately if you see an orange light blinking on your  monitor.  If your monitor falls off in less than 4 days, contact our Monitor department at [phone].  If your monitor becomes loose or falls off after  4 days call [name] Brown at [phone] for  suggestions on securing your monitor     Please report to Radiology at the [name] Main Entrance 30 minutes early for your test.  [address] [place], [place] [address]                         How to Prepare for Your Cardiac PET/CT Stress Test:  Nothing to eat or drink, except water, 3 hours prior to arrival time.  NO caffeine/decaffeinated products, or chocolate 12 hours prior to arrival. (Please note decaffeinated beverages (teas/coffees) still contain caffeine).  If you have caffeine within 12 hours prior, the test will need to be rescheduled.  Medication instructions:  You may take your medications with water.  NO perfume, cologne or lotion on chest or abdomen area. FEMALES - Please avoid wearing dresses to this appointment.  Total time is 1 to 2 hours; you may want to bring reading material for the waiting time.  In preparation for your appointment, medication and supplies will be purchased.  Appointment availability is limited, so if you need to cancel or reschedule, please call the Radiology Department at [phone] [name] Brown) OR [phone] [name] Brown) 24 hours in advance to avoid a cancellation fee of $100.00  What to Expect When you Arrive:  Once you arrive and check in for your appointment, you will be taken to a preparation room within the Radiology Department.  A technologist or Nurse will obtain your medical history, verify that you are correctly prepped for the exam, and explain the procedure.  Afterwards, an IV will be started in your arm and electrodes will be placed on your skin for EKG monitoring during the stress portion of the exam. Then you will be escorted to the PET/CT scanner.  There, staff will get you positioned on the scanner and obtain a blood pressure and EKG.  During the exam, you will continue to be connected to the EKG and blood pressure machines.  A small, safe amount of a radioactive tracer  will be injected in your IV to obtain a series of pictures of your heart along with an injection of a stress agent.    After your Exam:  It is recommended that you eat a meal and drink a caffeinated beverage to counter act any effects of the stress agent.  Drink plenty of fluids for the remainder of the day and urinate frequently for the first couple of hours after the exam.  Your doctor will inform you of your test results within 7-10 business days.  For more information and frequently asked questions, please visit our website: [email or web address]  For questions about your test or how to prepare for your test, please call: Cardiac Imaging Nurse Navigators Office: [phone]     Follow-Up: At [facility], you and your health needs are our priority.  As part of our continuing mission to  provide you with exceptional heart care, we have created designated Provider Care Teams.  These Care Teams include your primary Cardiologist (physician) and Advanced Practice Providers (APPs -  Physician Assistants and Nurse Practitioners) who all work together to provide you with the care you need, when you need it.  We recommend signing up for the patient portal called "MyChart".  Sign up information is provided on this After Visit Summary.  MyChart is used to connect with patients for Virtual Visits (Telemedicine).  Patients are able to view lab/test results, encounter notes, upcoming appointments, etc.  Non-urgent messages can be sent to your provider as well.   To learn more about what you can do with MyChart, go to [email or web address].

## 2023-05-22 NOTE — Progress Notes (Unsigned)
Cardiology Office Note:    Date:  05/22/2023   ID:  [name] Brown, DOB 03/08/1941, MRN [id number]  PCP:  [name] Richters, PA-C  Cardiologist:  Dietrich Pates, MD   Referring MD: [name] Richters, PA-C    ASSESSMENT:    No diagnosis found.  PLAN:    In order of problems listed above:  Primary prevention stressed with the patient.  Importance of compliance with diet medication stressed and patient verbalized standing. Persistent atrial fibrillation:I discussed with the patient atrial fibrillation, disease process. Management and therapy including rate and rhythm control, anticoagulation benefits and potential risks were discussed extensively with the patient. Patient had multiple questions which were answered to patient's satisfaction. Amiodarone therapy: I discussed benefits risks with her and she vocalized understanding.  She is agreeable.  Will do baseline blood work again today.  Her chest x-ray baseline was fine.  Will initiate her on 200 mg daily for 2 weeks and then cut down to 200 mg daily.  She will be seen for follow-up appointment in 3 to 4 weeks.  At that time if she is not in sinus we will initiate cardioversion protocol.  She had multiple questions which were answered to satisfaction. Essential hypertension: She mentions to me that her blood pressure is elevated and similar at home I have initiated on amlodipine 2.5 mg.  She will keep a track of her vital signs and get back to [place] in a month. Obesity: Weight reduction stressed diet emphasized and she promises to do better.  Medication Adjustments/Labs and Tests Ordered: Current medicines are reviewed at length with the patient today.  Concerns regarding medicines are outlined above.  No orders of the defined types were placed in this encounter.  No orders of the defined types were placed in this encounter.    No chief complaint on file.    History of Present Illness:    [name] Brown is a 82 y.o. female.  Patient has  past medical history of essential hypertension, persistent atrial fibrillation and obesity.  Overall she leads a sedentary lifestyle.  She mentions to me that if she exerts she has some shortness of breath otherwise she is fine.  No chest pain orthopnea or PND.  At the time of my evaluation, the patient is alert awake oriented and in no distress.  Past Medical History:  Diagnosis Date   A-fib [facility])    Actinic keratosis 01/15/2007   Centricity Description: ACTINIC KERATOSIS  Qualifier: Diagnosis of   By: Thomos Lemons      Centricity Description: SOLAR KERATOSIS  Qualifier: Diagnosis of   By: Thomos Lemons       ACUTE BRONCHITIS 06/27/2010   Qualifier: Diagnosis of   By: Thomos Lemons       Anxiety state 03/20/2006   Qualifier: Diagnosis of   By: Cathey Endow DO, Karen       Cardiac murmur 12/05/2022   Chronic anticoagulation 03/20/2006   Qualifier: Diagnosis of   By: Thomos Lemons       Depression    DYSPNEA 04/23/2009   Qualifier: Diagnosis of   By: Kem Parkinson       Essential tremor    GERD 04/23/2009   Qualifier: Diagnosis of   By: Kem Parkinson       Hyperlipidemia    INSOMNIA, CHRONIC 07/08/2007   Qualifier: Diagnosis of   By: Cathey Endow DO, Karen       Major depressive disorder, recurrent episode (HCC) 03/20/2006   Qualifier:  Diagnosis of   By: Thomos Lemons       Memory loss 08/09/2017   OBESITY, NOS 03/20/2006   Qualifier: Diagnosis of   By: Cathey Endow DO, Karen       Persistent atrial fibrillation (HCC) 01/06/2021   Secondary hypercoagulable state (HCC) 01/06/2021   Transient cerebral ischemia 07/21/2008   Qualifier: Diagnosis of   By: Cathey Endow DO, Karen       Transient global amnesia 08/09/2017   Tremor 08/09/2017   UTI 04/28/2009   Qualifier: Diagnosis of   By: Thomos Lemons        Past Surgical History:  Procedure Laterality Date   ABDOMINAL HYSTERECTOMY     APPENDECTOMY     breast biopsies Bilateral    CARDIOVERSION N/A 02/15/2021   Procedure: CARDIOVERSION;   Surgeon: Little Ishikawa, MD;  Location: [facility] ENDOSCOPY;  Service: Cardiovascular;  Laterality: N/A;   CYST EXCISION N/A 07/10/2016   Procedure: 3CM CYST EXCISION TRUNK;  Surgeon: Franky Macho, MD;  Location: [facility]S;  Service: General;  Laterality: N/A;   FL INJ LEFT KNEE CT ARTHROGRAM ([facility] HX)     TONSILLECTOMY      Current Medications: Current Meds  Medication Sig   acetaminophen (TYLENOL) 500 MG tablet Take 1,500 mg by mouth daily as needed for moderate pain.   ALPRAZolam (XANAX) 0.5 MG tablet TAKE 1 TABLET BY MOUTH TWICE DAILY AS NEEDED FOR ANXIETY   ELIQUIS 5 MG TABS tablet TAKE 1 TABLET(5 MG) BY MOUTH TWICE DAILY   mirtazapine (REMERON) 30 MG tablet TAKE 1 TABLET(30 MG) BY MOUTH AT BEDTIME   Omega-3 Fatty Acids (FISH OIL) 1000 MG CAPS Take 2,000 mg by mouth at bedtime.   propranolol (INDERAL) 40 MG tablet TAKE 1 TABLET(40 MG) BY MOUTH TWICE DAILY     Allergies:   Penicillins, Cephalosporins, and Prochlorperazine edisylate   Social History   Socioeconomic History   Marital status: Widowed    Spouse name: Not on file   Number of children: 2   Years of education: college   Highest education level: Associate degree: academic program  Occupational History   Occupation: Retired Charity fundraiser  Tobacco Use   Smoking status: Never    Passive exposure: Never   Smokeless tobacco: Never  Vaping Use   Vaping status: Never Used  Substance and Sexual Activity   Alcohol use: Yes    Alcohol/week: 1.0 standard drink of alcohol    Types: 1 Glasses of wine per week    Comment: occasional glass of wine   Drug use: No   Sexual activity: Never  Other Topics Concern   Not on file  Social History Narrative   Lives alone.   Right-handed.   Two children - 1 biological, 1 adopted.   Occasional use of caffeine.   Social Determinants of Health   Financial Resource Strain: Low Risk  (10/20/2022)   Overall Financial Resource Strain (CARDIA)    Difficulty of Paying Living Expenses: Not hard at  all  Food Insecurity: No Food Insecurity (10/20/2022)   Hunger Vital Sign    Worried About Running Out of Food in the Last Year: Never true    Ran Out of Food in the Last Year: Never true  Transportation Needs: No Transportation Needs (10/20/2022)   PRAPARE - Administrator, Civil Service (Medical): No    Lack of Transportation (Non-Medical): No  Physical Activity: Insufficiently Active (10/20/2022)   Exercise Vital Sign    Days of Exercise per  Week: 3 days    Minutes of Exercise per Session: 20 min  Stress: No Stress Concern Present (10/20/2022)   [employer] of Occupational Health - Occupational Stress Questionnaire    Feeling of Stress : Only a little  Social Connections: Moderately Isolated (10/20/2022)   Social Connection and Isolation Panel [NHANES]    Frequency of Communication with Friends and Family: More than three times a week    Frequency of Social Gatherings with Friends and Family: More than three times a week    Attends Religious Services: More than 4 times per year    Active Member of [employer] or Organizations: No    Attends Banker Meetings: Not on file    Marital Status: Widowed     Family History: The patient's family history includes Atrial fibrillation in her father, mother, and sister; COPD in her mother; Lung cancer in her father.  ROS:   Please see the history of present illness.    All other systems reviewed and are negative.  EKGs/Labs/Other Studies Reviewed:    The following studies were reviewed today: .Marland Kitchen      Recent Labs: 12/05/2022: Hemoglobin 14.6; Platelets 234 02/14/2023: ALT 17; BUN 23; Creatinine, Ser 1.01; Potassium 5.0; Sodium 144 02/23/2023: TSH 1.57  Recent Lipid Panel    Component Value Date/Time   CHOL 217 (H) 02/15/2022 0832   TRIG 309.0 (H) 02/15/2022 0832   HDL 36.90 (L) 02/15/2022 0832   CHOLHDL 6 02/15/2022 0832   VLDL 61.8 (H) 02/15/2022 0832   LDLCALC 152 (H) 05/18/2010 2005   LDLDIRECT 134.0  02/15/2022 0832    Physical Exam:    VS:  BP (!) 142/82   Pulse (!) 103   Ht 5\' 8"  (1.727 m)   Wt 233 lb 9.6 oz (106 kg)   SpO2 97%   BMI 35.52 kg/m     Wt Readings from Last 3 Encounters:  05/22/23 233 lb 9.6 oz (106 kg)  02/23/23 234 lb 12.8 oz (106.5 kg)  01/19/23 232 lb 1.3 oz (105.3 kg)     GEN: Patient is in no acute distress HEENT: Normal NECK: No JVD; No carotid bruits LYMPHATICS: No lymphadenopathy CARDIAC: Hear sounds regular, 2/6 systolic murmur at the apex. RESPIRATORY:  Clear to auscultation without rales, wheezing or rhonchi  ABDOMEN: Soft, non-tender, non-distended MUSCULOSKELETAL:  No edema; No deformity  SKIN: Warm and dry NEUROLOGIC:  Alert and oriented x 3 PSYCHIATRIC:  Normal affect   Signed, Dietrich Pates, MD  05/22/2023 4:17 PM    [facility]

## 2023-05-22 NOTE — Progress Notes (Unsigned)
Enrolled for [employer] to mail a ZIO XT long term holter monitor to the patients address on file.   Dr. Revankar to read. 

## 2023-05-30 DIAGNOSIS — R0609 Other forms of dyspnea: Secondary | ICD-10-CM

## 2023-05-30 DIAGNOSIS — I4819 Other persistent atrial fibrillation: Secondary | ICD-10-CM

## 2023-06-09 DIAGNOSIS — I4819 Other persistent atrial fibrillation: Secondary | ICD-10-CM | POA: Diagnosis not present

## 2023-06-09 DIAGNOSIS — R0609 Other forms of dyspnea: Secondary | ICD-10-CM | POA: Diagnosis not present

## 2023-06-16 DIAGNOSIS — B9689 Other specified bacterial agents as the cause of diseases classified elsewhere: Secondary | ICD-10-CM | POA: Diagnosis not present

## 2023-06-16 DIAGNOSIS — J208 Acute bronchitis due to other specified organisms: Secondary | ICD-10-CM | POA: Diagnosis not present

## 2023-06-16 DIAGNOSIS — J029 Acute pharyngitis, unspecified: Secondary | ICD-10-CM | POA: Diagnosis not present

## 2023-06-26 ENCOUNTER — Other Ambulatory Visit: Payer: Self-pay | Admitting: Medical

## 2023-06-26 NOTE — Telephone Encounter (Signed)
 Requesting: alprazolam 0.5mg   Contract: 08/23/22 UDS: 08/23/22 Last Visit: 02/23/23 Next Visit:  None Last Refill: 03/29/23 #60 and 2RF   Please Advise

## 2023-06-29 ENCOUNTER — Telehealth: Payer: Self-pay | Admitting: Medical

## 2023-06-29 NOTE — Telephone Encounter (Signed)
Copied from CRM [id number]. Topic: Medicare AWV >> Jun 29, 2023  9:38 AM Payton Doughty wrote: Reason for CRM: Called LVM 06/29/2023 to schedule AWV. Please schedule office or virtual visits.  Verlee Rossetti; Care Guide Ambulatory Clinical Support [facility] Direct Dial: [phone]

## 2023-07-09 ENCOUNTER — Encounter (HOSPITAL_COMMUNITY): Payer: Self-pay

## 2023-07-10 ENCOUNTER — Telehealth (HOSPITAL_COMMUNITY): Payer: Self-pay | Admitting: *Deleted

## 2023-07-10 NOTE — Telephone Encounter (Signed)
Reaching out to patient to offer assistance regarding upcoming cardiac imaging study; pt verbalizes understanding of appt date/time, parking situation and where to check in, pre-test NPO status and verified current allergies; name and call back number provided for further questions should they arise  [name] Brick RN Navigator Cardiac Imaging [facility] [phone] office [phone] cell  Patient aware to avoid caffeine 12 hours prior to her cardiac PET scan.

## 2023-07-11 ENCOUNTER — Encounter (HOSPITAL_COMMUNITY)
Admission: RE | Admit: 2023-07-11 | Discharge: 2023-07-11 | Disposition: A | Discharge status: Home / Self Care | Payer: Medicare Other | Source: Ambulatory Visit | Attending: Cardiology | Admitting: Cardiology

## 2023-07-11 ENCOUNTER — Telehealth: Payer: Self-pay | Admitting: Cardiology

## 2023-07-11 DIAGNOSIS — I4819 Other persistent atrial fibrillation: Secondary | ICD-10-CM | POA: Insufficient documentation

## 2023-07-11 DIAGNOSIS — R0609 Other forms of dyspnea: Secondary | ICD-10-CM | POA: Diagnosis not present

## 2023-07-11 LAB — NM PET CT CARDIAC PERFUSION MULTI W/ABSOLUTE BLOODFLOW
LV dias vol: 80 mL (ref 46–106)
LV sys vol: 43 mL
MBFR: 2.54
Nuc Rest EF: 46 %
Nuc Stress EF: 47 %
Peak HR: 84 {beats}/min
Rest HR: 92 {beats}/min
Rest MBF: 0.85 ml/g/min
Rest Nuclear Isotope Dose: 27.3 mCi
ST Depression (mm): 0 mm
Stress MBF: 2.16 ml/g/min
Stress Nuclear Isotope Dose: 27.3 mCi

## 2023-07-11 MED ORDER — REGADENOSON 0.4 MG/5ML IV SOLN
0.4000 mg | Freq: Once | INTRAVENOUS | Status: AC
  Administered 2023-07-11: 0.4 mg via INTRAVENOUS

## 2023-07-11 MED ORDER — REGADENOSON 0.4 MG/5ML IV SOLN
INTRAVENOUS | Status: AC
  Filled 2023-07-11: qty 5

## 2023-07-11 MED ORDER — RUBIDIUM RB82 GENERATOR (RUBYFILL)
27.3400 | PACK | Freq: Once | INTRAVENOUS | Status: AC
  Administered 2023-07-11: 27.34 via INTRAVENOUS

## 2023-07-11 MED ORDER — RUBIDIUM RB82 GENERATOR (RUBYFILL)
27.3300 | PACK | Freq: Once | INTRAVENOUS | Status: AC
  Administered 2023-07-11: 27.33 via INTRAVENOUS

## 2023-07-11 NOTE — Telephone Encounter (Signed)
Patient of Dr. Sung Amabile with [facility] Radiology who reports:  IMPRESSION: 1. Mass-like area in the right lung predominantly centered in the right middle lobe, poorly characterized on today's examination secondary to respiratory motion. Given the apparent metabolic activity in this region on the PET portion of the examination, this could represent neoplasm, or could be of infectious or inflammatory etiology. Short-term follow-up noncontrast chest CT is recommended in 2-3 weeks to re-evaluate this lesion.

## 2023-07-11 NOTE — Telephone Encounter (Signed)
[facility] is calling to talk with triage team for a call report

## 2023-07-12 ENCOUNTER — Encounter: Payer: Self-pay | Admitting: Internal Medicine

## 2023-07-13 ENCOUNTER — Encounter: Payer: Self-pay | Admitting: Medical

## 2023-07-13 NOTE — Telephone Encounter (Signed)
Recommendations reviewed with pt as per Dr. Krasowski's note.  Pt verbalized understanding and had no additional questions.  

## 2023-07-13 NOTE — Addendum Note (Signed)
Addended by: Gwenevere Abbot on: 07/13/2023 05:13 PM   Modules accepted: Orders

## 2023-07-16 ENCOUNTER — Other Ambulatory Visit: Payer: Self-pay

## 2023-07-20 ENCOUNTER — Other Ambulatory Visit: Payer: Self-pay | Admitting: Medical

## 2023-07-27 ENCOUNTER — Encounter: Payer: Self-pay | Admitting: Medical

## 2023-07-27 ENCOUNTER — Ambulatory Visit (HOSPITAL_BASED_OUTPATIENT_CLINIC_OR_DEPARTMENT_OTHER)
Admission: RE | Admit: 2023-07-27 | Discharge: 2023-07-27 | Disposition: A | Discharge status: Home / Self Care | Payer: Medicare Other | Source: Ambulatory Visit | Attending: Medical | Admitting: Medical

## 2023-07-27 DIAGNOSIS — R911 Solitary pulmonary nodule: Secondary | ICD-10-CM | POA: Diagnosis not present

## 2023-07-27 DIAGNOSIS — R918 Other nonspecific abnormal finding of lung field: Secondary | ICD-10-CM | POA: Diagnosis not present

## 2023-07-27 DIAGNOSIS — I700 Atherosclerosis of aorta: Secondary | ICD-10-CM | POA: Diagnosis not present

## 2023-07-30 ENCOUNTER — Encounter: Payer: Self-pay | Admitting: Medical

## 2023-07-30 ENCOUNTER — Ambulatory Visit (INDEPENDENT_AMBULATORY_CARE_PROVIDER_SITE_OTHER): Payer: Medicare Other | Admitting: Medical

## 2023-07-30 VITALS — BP 125/78 | HR 85 | Temp 98.0°F | Resp 18 | Ht 68.0 in | Wt 232.0 lb

## 2023-07-30 DIAGNOSIS — G454 Transient global amnesia: Secondary | ICD-10-CM

## 2023-07-30 DIAGNOSIS — F411 Generalized anxiety disorder: Secondary | ICD-10-CM

## 2023-07-30 DIAGNOSIS — R918 Other nonspecific abnormal finding of lung field: Secondary | ICD-10-CM

## 2023-07-30 DIAGNOSIS — I10 Essential (primary) hypertension: Secondary | ICD-10-CM | POA: Diagnosis not present

## 2023-07-30 DIAGNOSIS — Z79899 Other long term (current) drug therapy: Secondary | ICD-10-CM

## 2023-07-30 DIAGNOSIS — I4891 Unspecified atrial fibrillation: Secondary | ICD-10-CM

## 2023-07-30 NOTE — Progress Notes (Signed)
Subjective:    Patient ID: [name] Brown, female    DOB: [birth year], 83 y.o.   MRN: [id number]  HPI   Recent ct below.   IMPRESSION: Area abnormality in the middle lobe on prior examination is again seen today. This would have a differential including an aggressive or neoplastic lesion such as adenocarcinoma. Although this area was with increased uptake on the Rubidium PET-CT, the reliability of true diagnostic hypermetabolic activity with that radiotracer is not truly correlate of and would recommend additional workup today this appears to be a new lesion from older examinations such as a true dedicated FDG PET-CT.   Several other tiny lung nodules identified which are under 5 mm. Separate areas of old granulomatous disease.    Discussed the use of AI scribe software for clinical note transcription with the patient, who gave verbal consent to proceed.  History of Present Illness   [name] Brown is an 83 year old female with atrial fibrillation who presents with shortness of breath and an incidental lung mass finding on imaging.  She has been experiencing shortness of breath recently, prompting further investigation. A cardiac PET scan, initially performed for cardiac evaluation, was unremarkable but revealed an incidental mass-like area in the right middle lobe of the lung. A subsequent CT scan suggested the possibility of a neoplastic lesion such as adenocarcinoma, though not definitive.  She has experienced episodes of transient global amnesia, with the first episode occurring in 2019 during a concert with her granddaughters. During this episode, she was alert but confused and does not recall the events of that day. A second episode occurred recently during a cardiac PET scan, characterized by confusion and memory loss for two to three hours. No headaches or muscle aches were reported during these episodes. No motor or sensory deficits.  Her past medical history includes  atrial fibrillation, for which she is on Eliquis 5 mg daily and Inderal 40 mg twice daily. Her atrial fibrillation is stable and rate-controlled. She also has a history of anxiety, currently managed with medication, and she recently received a refill for this prescription.  She has significant secondhand smoke exposure from her parents and husband, who were heavy smokers. Additionally, her father died of lung cancer.          Review of Systems  Constitutional:  Negative for chills, fatigue and fever.  HENT:  Negative for congestion and drooling.   Respiratory:  Negative for cough, chest tightness, shortness of breath and wheezing.   Cardiovascular:  Negative for chest pain and palpitations.  Gastrointestinal:  Negative for abdominal pain.  Genitourinary:  Negative for dyspareunia and dysuria.  Musculoskeletal:  Negative for back pain, myalgias and neck stiffness.  Skin:  Negative for rash.  Neurological:  Negative for dizziness, facial asymmetry, speech difficulty, weakness and light-headedness.       A and O now.  Hematological:  Negative for adenopathy. Does not bruise/bleed easily.  Psychiatric/Behavioral:  Negative for behavioral problems, dysphoric mood, sleep disturbance and suicidal ideas. The patient is nervous/anxious.     Past Medical History:  Diagnosis Date   A-fib [facility])    Actinic keratosis 01/15/2007   Centricity Description: ACTINIC KERATOSIS  Qualifier: Diagnosis of   By: Thomos Lemons      Centricity Description: SOLAR KERATOSIS  Qualifier: Diagnosis of   By: Thomos Lemons       ACUTE BRONCHITIS 06/27/2010   Qualifier: Diagnosis of   By: Thomos Lemons  Anxiety state 03/20/2006   Qualifier: Diagnosis of   By: Cathey Endow DO, Karen       Cardiac murmur 12/05/2022   Chronic anticoagulation 03/20/2006   Qualifier: Diagnosis of   By: Thomos Lemons       Depression    DYSPNEA 04/23/2009   Qualifier: Diagnosis of   By: Kem Parkinson       Essential tremor     GERD 04/23/2009   Qualifier: Diagnosis of   By: Kem Parkinson       Hyperlipidemia    INSOMNIA, CHRONIC 07/08/2007   Qualifier: Diagnosis of   By: Cathey Endow DO, Karen       Major depressive disorder, recurrent episode (HCC) 03/20/2006   Qualifier: Diagnosis of   By: Thomos Lemons       Memory loss 08/09/2017   OBESITY, NOS 03/20/2006   Qualifier: Diagnosis of   By: Cathey Endow DO, Karen       Persistent atrial fibrillation (HCC) 01/06/2021   Secondary hypercoagulable state (HCC) 01/06/2021   Transient cerebral ischemia 07/21/2008   Qualifier: Diagnosis of   By: Cathey Endow DO, Karen       Transient global amnesia 08/09/2017   Tremor 08/09/2017   UTI 04/28/2009   Qualifier: Diagnosis of   By: Thomos Lemons         Social History   Socioeconomic History   Marital status: Widowed    Spouse name: Not on file   Number of children: 2   Years of education: college   Highest education level: Associate degree: academic program  Occupational History   Occupation: Retired Charity fundraiser  Tobacco Use   Smoking status: Never    Passive exposure: Never   Smokeless tobacco: Never  Vaping Use   Vaping status: Never Used  Substance and Sexual Activity   Alcohol use: Yes    Alcohol/week: 1.0 standard drink of alcohol    Types: 1 Glasses of wine per week    Comment: occasional glass of wine   Drug use: No   Sexual activity: Never  Other Topics Concern   Not on file  Social History Narrative   Lives alone.   Right-handed.   Two children - 1 biological, 1 adopted.   Occasional use of caffeine.   Social Drivers of Corporate investment banker Strain: Low Risk  (10/20/2022)   Overall Financial Resource Strain (CARDIA)    Difficulty of Paying Living Expenses: Not hard at all  Food Insecurity: No Food Insecurity (10/20/2022)   Hunger Vital Sign    Worried About Running Out of Food in the Last Year: Never true    Ran Out of Food in the Last Year: Never true  Transportation Needs: No Transportation  Needs (10/20/2022)   PRAPARE - Administrator, Civil Service (Medical): No    Lack of Transportation (Non-Medical): No  Physical Activity: Insufficiently Active (10/20/2022)   Exercise Vital Sign    Days of Exercise per Week: 3 days    Minutes of Exercise per Session: 20 min  Stress: No Stress Concern Present (10/20/2022)   [employer] of Occupational Health - Occupational Stress Questionnaire    Feeling of Stress : Only a little  Social Connections: Moderately Isolated (10/20/2022)   Social Connection and Isolation Panel [NHANES]    Frequency of Communication with Friends and Family: More than three times a week    Frequency of Social Gatherings with Friends and Family: More than three times a week  Attends Religious Services: More than 4 times per year    Active Member of Clubs or Organizations: No    Attends Banker Meetings: Not on file    Marital Status: Widowed  Intimate Partner Violence: Not At Risk (07/07/2022)   Humiliation, Afraid, Rape, and Kick questionnaire    Fear of Current or Ex-Partner: No    Emotionally Abused: No    Physically Abused: No    Sexually Abused: No    Past Surgical History:  Procedure Laterality Date   ABDOMINAL HYSTERECTOMY     APPENDECTOMY     breast biopsies Bilateral    CARDIOVERSION N/A 02/15/2021   Procedure: CARDIOVERSION;  Surgeon: Little Ishikawa, MD;  Location: [facility] ENDOSCOPY;  Service: Cardiovascular;  Laterality: N/A;   CYST EXCISION N/A 07/10/2016   Procedure: 3CM CYST EXCISION TRUNK;  Surgeon: Franky Macho, MD;  Location: [facility]S;  Service: General;  Laterality: N/A;   FL INJ LEFT KNEE CT ARTHROGRAM ([facility] HX)     TONSILLECTOMY      Family History  Problem Relation Age of Onset   Atrial fibrillation Mother    COPD Mother    Atrial fibrillation Father    Lung cancer Father    Atrial fibrillation Sister     Allergies  Allergen Reactions   Penicillins Anaphylaxis    Has patient had a PCN reaction  causing immediate rash, facial/tongue/throat swelling, SOB or lightheadedness with hypotension: Yes Has patient had a PCN reaction causing severe rash involving mucus membranes or skin necrosis: No Has patient had a PCN reaction that required hospitalization Yes Has patient had a PCN reaction occurring within the last 10 years: Yes If all of the above answers are "NO", then may proceed with Cephalosporin use.    Cephalosporins Other (See Comments)    Due to allergic reaction to penicillin   Prochlorperazine Edisylate     REACTION: draws mouth to side    Current Outpatient Medications on File Prior to Visit  Medication Sig Dispense Refill   acetaminophen (TYLENOL) 500 MG tablet Take 1,500 mg by mouth daily as needed for moderate pain.     ALPRAZolam (XANAX) 0.5 MG tablet TAKE 1 TABLET BY MOUTH TWICE DAILY AS NEEDED FOR ANXIETY 60 tablet 1   ELIQUIS 5 MG TABS tablet TAKE 1 TABLET(5 MG) BY MOUTH TWICE DAILY 60 tablet 11   mirtazapine (REMERON) 30 MG tablet TAKE 1 TABLET(30 MG) BY MOUTH AT BEDTIME 30 tablet 2   Omega-3 Fatty Acids (FISH OIL) 1000 MG CAPS Take 2,000 mg by mouth at bedtime.     propranolol (INDERAL) 40 MG tablet TAKE 1 TABLET(40 MG) BY MOUTH TWICE DAILY 180 tablet 0   No current facility-administered medications on file prior to visit.    BP 125/78   Pulse 85   Temp 98 F (36.7 C)   Resp 18   Ht 5\' 8"  (1.727 m)   Wt 232 lb (105.2 kg)   SpO2 97%   BMI 35.28 kg/m          Objective:   Physical Exam  General Mental Status- Alert. General Appearance- Not in acute distress.   Skin General: Color- Normal Color. Moisture- Normal Moisture.  Neck No JVD.  Chest and Lung Exam Auscultation: Breath Sounds:-CTA  Cardiovascular Auscultation:Rythm- RRR Murmurs & Other Heart Sounds:Auscultation of the heart reveals- No Murmurs.  Abdomen Inspection:-Inspeection Normal. Palpation/Percussion:Note:No mass. Palpation and Percussion of the abdomen reveal- Non Tender,  Non Distended + BS, no rebound or guarding.  Neurologic Cranial Nerve exam:- CN III-XII intact(No nystagmus), symmetric smile. Strength:- 5/5 equal and symmetric strength both upper and lower extremities.       Assessment & Plan:   Assessment and Plan    Patient Instructions  Incidental lung mass/lesion  Noted on cardiac PET scan with subsequent CT suggesting possible neoplastic lesion such as adenocarcinoma. Patient has significant secondhand smoke exposure history and recent shortness of breath. -Urgent referral to pulmonology for further evaluation and possible biopsy.  Transient Global Amnesia Two episodes reported, one in 2019 and another recent episode around the time of cardiac PET scan. No associated headache or muscle soreness. No immediate neurological evaluation planned due to prioritization of pulmonary evaluation. -referral to neurology placed.   Atrial Fibrillation Stable on current medications (Eliquis 5mg  daily, Inderal 40mg  twice daily) as managed by cardiologist. No changes made. -Continue current medications as prescribed by cardiologist.  Anxiety Patient under significant stress with personal and family health concerns. Currently on Xanax 0.5mg  twice daily. -Continue Xanax as prescribed. -Signed controlled medication contract and provided urine drug screen.  Follow-up Next appointment scheduled for 08/23/2023, but may be cancelled depending on patient's status and control of anxiety.    Esperanza Richters, PA-C

## 2023-07-30 NOTE — Addendum Note (Signed)
Addended by: Maximino Sarin on: 07/30/2023 11:57 AM   Modules accepted: Orders

## 2023-07-30 NOTE — Patient Instructions (Signed)
Incidental lung mass/lesion  Noted on cardiac PET scan with subsequent CT suggesting possible neoplastic lesion such as adenocarcinoma. Patient has significant secondhand smoke exposure history and recent shortness of breath. -Urgent referral to pulmonology for further evaluation and possible biopsy.  Transient Global Amnesia Two episodes reported, one in 2019 and another recent episode around the time of cardiac PET scan. No associated headache or muscle soreness. No immediate neurological evaluation planned due to prioritization of pulmonary evaluation. -referral to neurology placed.   Atrial Fibrillation Stable on current medications (Eliquis 5mg  daily, Inderal 40mg  twice daily) as managed by cardiologist. No changes made. -Continue current medications as prescribed by cardiologist.  Anxiety Patient under significant stress with personal and family health concerns. Currently on Xanax 0.5mg  twice daily. -Continue Xanax as prescribed. -Signed controlled medication contract and provided urine drug screen.  Follow-up Next appointment scheduled for 08/23/2023, but may be cancelled depending on patient's status and control of anxiety.

## 2023-08-01 ENCOUNTER — Encounter: Payer: Self-pay | Admitting: Student in an Organized Health Care Education/Training Program

## 2023-08-01 ENCOUNTER — Ambulatory Visit: Payer: Medicare Other | Admitting: Student in an Organized Health Care Education/Training Program

## 2023-08-01 VITALS — BP 136/84 | HR 99 | Temp 97.6°F | Ht 68.0 in | Wt 232.2 lb

## 2023-08-01 DIAGNOSIS — R0602 Shortness of breath: Secondary | ICD-10-CM | POA: Diagnosis not present

## 2023-08-01 DIAGNOSIS — R911 Solitary pulmonary nodule: Secondary | ICD-10-CM

## 2023-08-01 NOTE — Progress Notes (Signed)
Assessment & Plan:   1. Shortness of breath  Presenting for the evaluation of shortness of breath with exertion in the setting of history of atrial fibrillation.  She does not have any smoking history and nothing on history suggests obstructive lung disease.  She has been undergoing cardiac workup for ischemia which so far appears unremarkable.  She is rate controlled and on anticoagulation for her atrial fibrillation, and chronotropic insufficiency is a possibility. Most recent echocardiogram from July 2024 showed normal left and right systolic function. For workup, we will obtain a pulmonary function test to assess spirometry, lung volumes, and DLCO.  I will also add MIP/MEP to evaluate for neuromuscular weakness. She has not had anemia historically on blood work but we will consider repeating this on follow-up.  - Pulmonary Function Test [facility] Only; Future  2. Lung nodule (Primary)  Presenting for the evaluation of a nodule in the right middle lobe which on my evaluation is more akin to an infiltrate versus scarring versus atelectasis.  My differential for this includes mucus impaction, infectious changes, inflammatory changes and less likely malignancy.  I reviewed the images from the PET/CT as well as from her most recent CT scan of the chest and the infiltrate appears to have slightly regressed. I discussed this with the patient today and we will proceed with radiographic monitoring with repeat imaging in 2 months.  Should the infiltrate persist or progress, we will proceed with biopsy.  - CT SUPER D CHEST WO CONTRAST; Future   Return in about 2 months (around 10/05/2023).  I spent 60 minutes caring for this patient today, including preparing to see the patient, obtaining a medical history , reviewing a separately obtained history, performing a medically appropriate examination and/or evaluation, counseling and educating the patient/family/caregiver, ordering medications, tests, or  procedures, documenting clinical information in the electronic health record, and independently interpreting results (not separately reported/billed) and communicating results to the patient/family/caregiver  Raechel Chute, MD [facility] 08/01/2023 2:22 PM    End of visit medications:  No orders of the defined types were placed in this encounter.    Current Outpatient Medications:    acetaminophen (TYLENOL) 500 MG tablet, Take 1,500 mg by mouth daily as needed for moderate pain., Disp: , Rfl:    ALPRAZolam (XANAX) 0.5 MG tablet, TAKE 1 TABLET BY MOUTH TWICE DAILY AS NEEDED FOR ANXIETY, Disp: 60 tablet, Rfl: 1   ELIQUIS 5 MG TABS tablet, TAKE 1 TABLET(5 MG) BY MOUTH TWICE DAILY, Disp: 60 tablet, Rfl: 11   mirtazapine (REMERON) 30 MG tablet, TAKE 1 TABLET(30 MG) BY MOUTH AT BEDTIME, Disp: 30 tablet, Rfl: 2   Omega-3 Fatty Acids (FISH OIL) 1000 MG CAPS, Take 2,000 mg by mouth at bedtime., Disp: , Rfl:    propranolol (INDERAL) 40 MG tablet, TAKE 1 TABLET(40 MG) BY MOUTH TWICE DAILY, Disp: 180 tablet, Rfl: 0   Subjective:   PATIENT ID: [name] Brown GENDER: female DOB: 1941-04-26, MRN: [id number]  Chief Complaint  Patient presents with   Consult    Patient reports chronic cough and shortness of breath on exertion.     HPI  Patient is a pleasant 83 year old female presenting to clinic today for the evaluation of shortness of breath as well as a pulmonary nodule.  Patient has been endorsing shortness of breath for a little over a year now.  She experiences shortness of breath with moderate exertion such as walking from the parking lot to the hospital lobby today.  She feels short of breath when she does some of her activities of daily living.  This symptom is not associated with any chest pain or chest tightness nor has she experienced loss of consciousness with it.  She reports an occasional cough that is very sporadic and haphazard.  She does not report any sputum  production or hemoptysis.  She has a history of atrial fibrillation and has been followed closely with cardiology where she also underwent an ischemic workup.  She has had an echocardiogram which was unremarkable as well as a cardiac PET CT. the cardiac CT was notable for a pulmonary nodule in the right middle lobe for which she saw her primary care physician and underwent a chest CT again showing small infiltrate in the right middle lobe.  She is referred to pulmonology for further evaluation and workup.  She felt that she might of had a cold around the time she had her imaging studies.  She did not experience any sore throat, myalgias, arthralgias, fevers, or chills at that time but did feel that her cough and postnasal drip were a little more prominent.  She reports a history of being admitted to the ICU in 2007 where she was told she had "pancake lung" but was not intubated at that time.  She was previously followed by Dr. Maple Hudson in our clinic but this was many years ago.  Patient denies any personal history of smoking but does report significant secondhand smoke exposure (parents, late husband).  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  Ancillary information including prior medications, full medical/surgical/family/social histories, and PFTs (when available) are listed below and have been reviewed.   Review of Systems  Constitutional:  Negative for chills, diaphoresis, fever, malaise/fatigue and weight loss.  Respiratory:  Positive for shortness of breath. Negative for cough, hemoptysis, sputum production and wheezing.   Cardiovascular:  Negative for chest pain.     Objective:   Vitals:   08/01/23 1022  BP: 136/84  Pulse: 99  Temp: 97.6 F (36.4 C)  TempSrc: Temporal  SpO2: 95%  Weight: 232 lb 3.2 oz (105.3 kg)  Height: 5\' 8"  (1.727 m)   95% on RA BMI Readings from Last 3 Encounters:  08/01/23 35.31 kg/m  07/30/23 35.28  kg/m  05/22/23 35.52 kg/m   Wt Readings from Last 3 Encounters:  08/01/23 232 lb 3.2 oz (105.3 kg)  07/30/23 232 lb (105.2 kg)  05/22/23 233 lb 9.6 oz (106 kg)    Physical Exam Constitutional:      Appearance: Normal appearance. She is obese.  Cardiovascular:     Rate and Rhythm: Normal rate. Rhythm irregular.     Pulses: Normal pulses.     Heart sounds: Normal heart sounds.  Pulmonary:     Effort: Pulmonary effort is normal. No respiratory distress.     Breath sounds: Normal breath sounds. No wheezing or rales.  Neurological:     General: No focal deficit present.     Mental Status: She is alert and oriented to person, place, and time. Mental status is at baseline.       Ancillary Information    Past Medical History:  Diagnosis Date   A-fib [facility])    Actinic keratosis 01/15/2007   Centricity Description: ACTINIC KERATOSIS  Qualifier: Diagnosis of   By: Thomos Lemons      Centricity Description: SOLAR KERATOSIS  Qualifier: Diagnosis of   By: Thomos Lemons  ACUTE BRONCHITIS 06/27/2010   Qualifier: Diagnosis of   By: Thomos Lemons       Anxiety state 03/20/2006   Qualifier: Diagnosis of   By: Cathey Endow DO, Karen       Cardiac murmur 12/05/2022   Chronic anticoagulation 03/20/2006   Qualifier: Diagnosis of   By: Cathey Endow DO, Karen       Depression    DYSPNEA 04/23/2009   Qualifier: Diagnosis of   By: Kem Parkinson       Essential tremor    GERD 04/23/2009   Qualifier: Diagnosis of   By: Kem Parkinson       Hyperlipidemia    INSOMNIA, CHRONIC 07/08/2007   Qualifier: Diagnosis of   By: Cathey Endow DO, Karen       Major depressive disorder, recurrent episode (HCC) 03/20/2006   Qualifier: Diagnosis of   By: Thomos Lemons       Memory loss 08/09/2017   OBESITY, NOS 03/20/2006   Qualifier: Diagnosis of   By: Cathey Endow DO, Karen       Persistent atrial fibrillation (HCC) 01/06/2021   Secondary hypercoagulable state (HCC) 01/06/2021   Transient cerebral ischemia  07/21/2008   Qualifier: Diagnosis of   By: Cathey Endow DO, Karen       Transient global amnesia 08/09/2017   Tremor 08/09/2017   UTI 04/28/2009   Qualifier: Diagnosis of   By: Thomos Lemons         Family History  Problem Relation Age of Onset   Atrial fibrillation Mother    COPD Mother    Atrial fibrillation Father    Lung cancer Father    Atrial fibrillation Sister      Past Surgical History:  Procedure Laterality Date   ABDOMINAL HYSTERECTOMY     APPENDECTOMY     breast biopsies Bilateral    CARDIOVERSION N/A 02/15/2021   Procedure: CARDIOVERSION;  Surgeon: Little Ishikawa, MD;  Location: [facility] ENDOSCOPY;  Service: Cardiovascular;  Laterality: N/A;   CYST EXCISION N/A 07/10/2016   Procedure: 3CM CYST EXCISION TRUNK;  Surgeon: Franky Macho, MD;  Location: [facility] ORS;  Service: General;  Laterality: N/A;   FL INJ LEFT KNEE CT ARTHROGRAM ([facility] HX)     TONSILLECTOMY      Social History   Socioeconomic History   Marital status: Widowed    Spouse name: Not on file   Number of children: 2   Years of education: college   Highest education level: Associate degree: academic program  Occupational History   Occupation: Retired Charity fundraiser  Tobacco Use   Smoking status: Never    Passive exposure: Never   Smokeless tobacco: Never  Vaping Use   Vaping status: Never Used  Substance and Sexual Activity   Alcohol use: Yes    Alcohol/week: 1.0 standard drink of alcohol    Types: 1 Glasses of wine per week    Comment: occasional glass of wine   Drug use: No   Sexual activity: Never  Other Topics Concern   Not on file  Social History Narrative   Lives alone.   Right-handed.   Two children - 1 biological, 1 adopted.   Occasional use of caffeine.   Social Drivers of Corporate investment banker Strain: Low Risk  (10/20/2022)   Overall Financial Resource Strain (CARDIA)    Difficulty of Paying Living Expenses: Not hard at all  Food Insecurity: No Food Insecurity (10/20/2022)   Hunger  Vital Sign    Worried About Running Out  of Food in the Last Year: Never true    Ran Out of Food in the Last Year: Never true  Transportation Needs: No Transportation Needs (10/20/2022)   PRAPARE - Administrator, Civil Service (Medical): No    Lack of Transportation (Non-Medical): No  Physical Activity: Insufficiently Active (10/20/2022)   Exercise Vital Sign    Days of Exercise per Week: 3 days    Minutes of Exercise per Session: 20 min  Stress: No Stress Concern Present (10/20/2022)   [employer] of Occupational Health - Occupational Stress Questionnaire    Feeling of Stress : Only a little  Social Connections: Moderately Isolated (10/20/2022)   Social Connection and Isolation Panel [NHANES]    Frequency of Communication with Friends and Family: More than three times a week    Frequency of Social Gatherings with Friends and Family: More than three times a week    Attends Religious Services: More than 4 times per year    Active Member of [employer] or Organizations: No    Attends Banker Meetings: Not on file    Marital Status: Widowed  Intimate Partner Violence: Not At Risk (07/07/2022)   Humiliation, Afraid, Rape, and Kick questionnaire    Fear of Current or Ex-Partner: No    Emotionally Abused: No    Physically Abused: No    Sexually Abused: No     Allergies  Allergen Reactions   Penicillins Anaphylaxis    Has patient had a PCN reaction causing immediate rash, facial/tongue/throat swelling, SOB or lightheadedness with hypotension: Yes Has patient had a PCN reaction causing severe rash involving mucus membranes or skin necrosis: No Has patient had a PCN reaction that required hospitalization Yes Has patient had a PCN reaction occurring within the last 10 years: Yes If all of the above answers are "NO", then may proceed with Cephalosporin use.    Cephalosporins Other (See Comments)    Due to allergic reaction to penicillin   Prochlorperazine Edisylate      REACTION: draws mouth to side     CBC    Component Value Date/Time   WBC 5.8 12/05/2022 1516   WBC 5.6 05/30/2022 0903   RBC 4.98 12/05/2022 1516   RBC 5.04 05/30/2022 0903   HGB 14.6 12/05/2022 1516   HCT 44.5 12/05/2022 1516   PLT 234 12/05/2022 1516   MCV 89 12/05/2022 1516   MCH 29.3 12/05/2022 1516   MCH 30.0 01/24/2021 1336   MCHC 32.8 12/05/2022 1516   MCHC 33.3 05/30/2022 0903   RDW 13.7 12/05/2022 1516   LYMPHSABS 1.5 05/30/2022 0903   MONOABS 0.4 05/30/2022 0903   EOSABS 0.1 05/30/2022 0903   BASOSABS 0.0 05/30/2022 0903    Pulmonary Functions Testing Results:     No data to display          Outpatient Medications Prior to Visit  Medication Sig Dispense Refill   acetaminophen (TYLENOL) 500 MG tablet Take 1,500 mg by mouth daily as needed for moderate pain.     ALPRAZolam (XANAX) 0.5 MG tablet TAKE 1 TABLET BY MOUTH TWICE DAILY AS NEEDED FOR ANXIETY 60 tablet 1   ELIQUIS 5 MG TABS tablet TAKE 1 TABLET(5 MG) BY MOUTH TWICE DAILY 60 tablet 11   mirtazapine (REMERON) 30 MG tablet TAKE 1 TABLET(30 MG) BY MOUTH AT BEDTIME 30 tablet 2   Omega-3 Fatty Acids (FISH OIL) 1000 MG CAPS Take 2,000 mg by mouth at bedtime.     propranolol (INDERAL) 40  MG tablet TAKE 1 TABLET(40 MG) BY MOUTH TWICE DAILY 180 tablet 0   No facility-administered medications prior to visit.

## 2023-08-03 LAB — DRUG MONITORING PANEL 376104, URINE
Alphahydroxytriazolam: NEGATIVE ng/mL (ref <50)
Aminoclonazepam: NEGATIVE ng/mL (ref <50)
Barbiturates: NEGATIVE ng/mL (ref <500)
Benzodiazepines: 322 ng/mL — ABNORMAL HIGH (ref <25)
Benzodiazepines: POSITIVE ng/mL — AB (ref <300)
Cocaine Metabolite: NEGATIVE ng/mL (ref <150)
Hydroxyethylflurazepam: NEGATIVE ng/mL (ref <50)
Lorazepam: NEGATIVE ng/mL (ref <50)
Nordiazepam: NEGATIVE ng/mL (ref <50)
Opiates: NEGATIVE ng/mL (ref <150)
Oxazepam: NEGATIVE ng/mL (ref <50)
Oxycodone: NEGATIVE ng/mL (ref <100)
Oxycodone: NEGATIVE ng/mL (ref <100)
Prescribed Drug 1: NEGATIVE ng/mL (ref <500)
Temazepam: NEGATIVE ng/mL (ref <50)
Temazepam: NEGATIVE ng/mL (ref <50)
Tramadol Comments: NEGATIVE ng/mL (ref <100)
Tramadol: NEGATIVE ng/mL (ref <100)
medMATCH aOH alprazolam: 322 ng/mL — ABNORMAL HIGH (ref <25)
medMATCH aOH alprazolam: NEGATIVE ng/mL (ref <50)

## 2023-08-03 LAB — DM TEMPLATE

## 2023-08-09 ENCOUNTER — Encounter: Payer: Self-pay | Admitting: Student in an Organized Health Care Education/Training Program

## 2023-08-14 ENCOUNTER — Other Ambulatory Visit: Payer: Self-pay | Admitting: Medical

## 2023-08-16 ENCOUNTER — Ambulatory Visit: Payer: Medicare Other | Admitting: Neurology

## 2023-08-16 ENCOUNTER — Encounter: Payer: Self-pay | Admitting: Neurology

## 2023-08-16 VITALS — BP 138/80 | Ht 68.0 in | Wt 235.0 lb

## 2023-08-16 DIAGNOSIS — G250 Essential tremor: Secondary | ICD-10-CM

## 2023-08-16 DIAGNOSIS — G454 Transient global amnesia: Secondary | ICD-10-CM

## 2023-08-16 NOTE — Progress Notes (Signed)
 Chief Complaint  Patient presents with   New Patient (Initial Visit)    Rm 15-have seen previously internal urgent referral for recurrent TGA:      ASSESSMENT AND PLAN  [name] Brown is a 83 y.o. 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 I personally reviewed pertinent available imaging films in PACS.   PMHx of  A fib Depression, on Remeron Essential tremor, on propranolol 40mg  bid, xanax 0.5mg  bid.  I saw her in 2019 for prolonged episode of transient loss of consciousness, was diagnosed with transient global amnesia, had extensive evaluation then, normal MRI of the brain, EEG, echocardiogram, ultrasound of carotid artery  She now lives alone, drove to clinic today, she took care of her great grandchild regularly, intact with her family member without any difficulty  On July 11, 2023, she drove herself to cardiac PET scan, finished a scan from 9 AM to 10 AM, while driving back home, she called her friend and [place], was noted by her friend to be incoherent, confused, who urged her to  seeking medical attention, she drove herself back home, took a nap, was able to take care of her grandchild from 1 PM on  Her friend called back the same day about 8 hours later check on her at evening, she could not remember the conversation she had with her friend at [place] She is recently going through evaluation for complaints of shortness of breath, cardiac perfusion scan January 29 showed masslike area at the right lung middle lobe, CT chest showed area of abnormality at right middle lobe, was seen by pulmonologist, differentiation diagnosis include neoplastic lesion, will have repeat imaging study later  PHYSICAL EXAM:   Vitals:   08/16/23 1453  BP: 138/80  Weight: 235 lb (106.6 kg)  Height: 5\' 8"  (1.727 m)   Not recorded     Body mass index is 35.73 kg/m.  PHYSICAL EXAMNIATION:  Gen: NAD, conversant, well nourised, well groomed                     Cardiovascular: Regular rate rhythm, no peripheral edema, warm, nontender. Eyes: Conjunctivae clear without exudates or hemorrhage Neck: Supple, no carotid bruits. Pulmonary: Clear to auscultation bilaterally   NEUROLOGICAL EXAM:  MENTAL STATUS: Speech/cognition: Awake, alert, oriented to history taking and casual conversation, mild head titubation, spastic voice CRANIAL NERVES: CN II: Visual fields are full to confrontation. Pupils are round equal and briskly reactive to light. CN III, IV, VI: extraocular movement are normal. No ptosis. CN V: Facial sensation is intact to light touch CN VII: Face is symmetric with  normal eye closure  CN VIII: Hearing is normal to causal conversation. CN IX, X: Phonation is normal. CN XI: Head turning and shoulder shrug are intact  MOTOR: Mild bilateral hand posturing tremor, no rigidity, bradykinesia  REFLEXES: Reflexes are 2+ and symmetric at the biceps, triceps, knees, and ankles. Plantar responses are flexor.  SENSORY: Intact to light touch, pinprick and vibratory sensation are  intact in fingers and toes.  COORDINATION: There is no trunk or limb dysmetria noted.  GAIT/STANCE: Push-up from seated position, mildly antalgic due to right hip pain  REVIEW OF SYSTEMS:  Full 14 system review of systems performed and notable only for as above All other review of systems were negative.   ALLERGIES: Allergies  Allergen Reactions   Penicillins Anaphylaxis    Has patient had a PCN reaction causing immediate rash, facial/tongue/throat swelling, SOB or lightheadedness with hypotension: Yes Has patient had a PCN reaction causing severe rash involving mucus membranes or skin necrosis: No Has patient had a PCN reaction that required hospitalization Yes Has patient had a PCN reaction occurring within the last 10 years: Yes If all of the above answers are "NO", then may proceed with Cephalosporin use.    Cephalosporins Other (See Comments)    Due to allergic reaction to penicillin   Prochlorperazine Edisylate     REACTION: draws mouth to side    HOME MEDICATIONS: Current Outpatient Medications  Medication Sig Dispense Refill   acetaminophen (TYLENOL) 500 MG tablet Take 1,500 mg by mouth daily as needed for moderate pain.     ALPRAZolam (XANAX) 0.5 MG tablet TAKE 1 TABLET BY MOUTH TWICE DAILY AS NEEDED FOR ANXIETY 60 tablet 1   ELIQUIS 5 MG TABS tablet TAKE 1 TABLET(5 MG) BY MOUTH TWICE DAILY 60 tablet 11   mirtazapine (REMERON) 30 MG tablet TAKE 1 TABLET(30 MG) BY MOUTH AT BEDTIME 30 tablet 2   Omega-3 Fatty Acids (FISH OIL) 1000 MG CAPS Take 2,000 mg by mouth at bedtime.     propranolol (INDERAL) 40 MG tablet TAKE 1 TABLET(40 MG) BY MOUTH TWICE DAILY 180 tablet 0   No current facility-administered medications for this visit.    PAST MEDICAL HISTORY: Past Medical History:  Diagnosis Date   A-fib [facility])    Actinic keratosis 01/15/2007   Centricity Description: ACTINIC KERATOSIS  Qualifier: Diagnosis of   By: [name] Brown      Centricity Description: SOLAR  KERATOSIS  Qualifier: Diagnosis of   By: [name] Brown       ACUTE BRONCHITIS 06/27/2010   Qualifier: Diagnosis of   By: [name] Brown       Anxiety state 03/20/2006   Qualifier: Diagnosis of   By: Cathey Endow DO, Karen       Cardiac murmur 12/05/2022   Chronic anticoagulation 03/20/2006   Qualifier: Diagnosis of   By: [name] Brown       Depression    DYSPNEA 04/23/2009   Qualifier: Diagnosis of   By: Kem Parkinson       Essential tremor    GERD 04/23/2009   Qualifier: Diagnosis of   By: Kem Parkinson       Hyperlipidemia    INSOMNIA, CHRONIC 07/08/2007   Qualifier: Diagnosis of   By: Cathey Endow DO, Karen       Major depressive disorder, recurrent episode (HCC) 03/20/2006   Qualifier: Diagnosis of   By: [name] Brown       Memory loss 08/09/2017   OBESITY, NOS 03/20/2006  Qualifier: Diagnosis of   By: Cathey Endow DO, Karen       Persistent atrial fibrillation (HCC) 01/06/2021   Secondary hypercoagulable state (HCC) 01/06/2021   Transient cerebral ischemia 07/21/2008   Qualifier: Diagnosis of   By: Cathey Endow DO, Karen       Transient global amnesia 08/09/2017   Tremor 08/09/2017   UTI 04/28/2009   Qualifier: Diagnosis of   By: [name] Brown        PAST SURGICAL HISTORY: Past Surgical History:  Procedure Laterality Date   ABDOMINAL HYSTERECTOMY     APPENDECTOMY     breast biopsies Bilateral    CARDIOVERSION N/A 02/15/2021   Procedure: CARDIOVERSION;  Surgeon: Little Ishikawa, MD;  Location: [facility] ENDOSCOPY;  Service: Cardiovascular;  Laterality: N/A;   CYST EXCISION N/A 07/10/2016   Procedure: 3CM CYST EXCISION TRUNK;  Surgeon: Franky Macho, MD;  Location: [facility] ORS;  Service: General;  Laterality: N/A;   FL INJ LEFT KNEE CT ARTHROGRAM ([facility] HX)     TONSILLECTOMY      FAMILY HISTORY: Family History  Problem Relation Age of Onset   Atrial fibrillation Mother    COPD Mother    Atrial fibrillation Father    Lung cancer Father    Atrial fibrillation Sister     SOCIAL  HISTORY: Social History   Socioeconomic History   Marital status: Widowed    Spouse name: Not on file   Number of children: 2   Years of education: college   Highest education level: Associate degree: academic program  Occupational History   Occupation: Retired Charity fundraiser  Tobacco Use   Smoking status: Never    Passive exposure: Never   Smokeless tobacco: Never  Vaping Use   Vaping status: Never Used  Substance and Sexual Activity   Alcohol use: Yes    Alcohol/week: 1.0 standard drink of alcohol    Types: 1 Glasses of wine per week    Comment: occasional glass of wine   Drug use: No   Sexual activity: Never  Other Topics Concern   Not on file  Social History Narrative   Lives alone.   Right-handed.   Two children - 1 biological, 1 adopted.   Occasional use of caffeine.   Social Drivers of Corporate investment banker Strain: Low Risk  (10/20/2022)   Overall Financial Resource Strain (CARDIA)    Difficulty of Paying Living Expenses: Not hard at all  Food Insecurity: No Food Insecurity (10/20/2022)   Hunger Vital Sign    Worried About Running Out of Food in the Last Year: Never true    Ran Out of Food in the Last Year: Never true  Transportation Needs: No Transportation Needs (10/20/2022)   PRAPARE - Administrator, Civil Service (Medical): No    Lack of Transportation (Non-Medical): No  Physical Activity: Insufficiently Active (10/20/2022)   Exercise Vital Sign    Days of Exercise per Week: 3 days    Minutes of Exercise per Session: 20 min  Stress: No Stress Concern Present (10/20/2022)   [employer] of Occupational Health - Occupational Stress Questionnaire    Feeling of Stress : Only a little  Social Connections: Moderately Isolated (10/20/2022)   Social Connection and Isolation Panel [NHANES]    Frequency of Communication with Friends and Family: More than three times a week    Frequency of Social Gatherings with Friends and Family: More than three times a  week    Attends Religious Services: More than  4 times per year    Active Member of Clubs or Organizations: No    Attends Banker Meetings: Not on file    Marital Status: Widowed  Intimate Partner Violence: Not At Risk (07/07/2022)   Humiliation, Afraid, Rape, and Kick questionnaire    Fear of Current or Ex-Partner: No    Emotionally Abused: No    Physically Abused: No    Sexually Abused: No      Levert Feinstein, M.D. Ph.D.  [facility] [address], [address] [place], [place] [address] Ph: [phone] Fax: [phone]  CC:  [name] Brown [address] [place],  [place] [address]  [name], [name], PA-C

## 2023-08-17 ENCOUNTER — Encounter: Payer: Self-pay | Admitting: Neurology

## 2023-08-17 LAB — COMPREHENSIVE METABOLIC PANEL
ALT: 13 IU/L (ref 0–32)
AST: 16 IU/L (ref 0–40)
Albumin: 4.2 g/dL (ref 3.8–4.8)
Alkaline Phosphatase: 78 IU/L (ref 44–121)
BUN/Creatinine Ratio: 18 (ref 12–28)
BUN: 16 mg/dL (ref 8–27)
Bilirubin Total: 0.4 mg/dL (ref 0.0–1.2)
CO2: 22 mmol/L (ref 20–29)
Calcium: 9.2 mg/dL (ref 8.7–10.3)
Chloride: 106 mmol/L (ref 96–106)
Creatinine, Ser: 0.91 mg/dL (ref 0.57–1.00)
Globulin, Total: 2.8 g/dL (ref 1.5–4.5)
Glucose: 113 mg/dL — ABNORMAL HIGH (ref 70–99)
Potassium: 4.3 mmol/L (ref 3.5–5.2)
Sodium: 143 mmol/L (ref 134–144)
Total Protein: 7 g/dL (ref 6.0–8.5)
eGFR: 64 mL/min/{1.73_m2} (ref >59)

## 2023-08-20 ENCOUNTER — Telehealth: Payer: Self-pay | Admitting: Neurology

## 2023-08-20 ENCOUNTER — Other Ambulatory Visit: Payer: Self-pay | Admitting: Medical

## 2023-08-20 NOTE — Telephone Encounter (Signed)
 no auth required sent to [facility] [phone]

## 2023-08-27 ENCOUNTER — Other Ambulatory Visit: Payer: Self-pay | Admitting: Family

## 2023-08-29 ENCOUNTER — Ambulatory Visit: Admitting: Neurology

## 2023-08-29 ENCOUNTER — Encounter: Payer: Self-pay | Admitting: Medical

## 2023-08-29 DIAGNOSIS — R410 Disorientation, unspecified: Secondary | ICD-10-CM | POA: Diagnosis not present

## 2023-08-29 DIAGNOSIS — G454 Transient global amnesia: Secondary | ICD-10-CM

## 2023-08-30 NOTE — Telephone Encounter (Signed)
 Requesting: xanax Contract:07/30/23 UDS:07/30/23 Last Visit:07/30/23 Next Visit:n/a Last Refill:06/26/23  Please Advise

## 2023-08-31 ENCOUNTER — Encounter: Payer: Self-pay | Admitting: *Deleted

## 2023-08-31 ENCOUNTER — Encounter: Payer: Self-pay | Admitting: Medical

## 2023-08-31 ENCOUNTER — Other Ambulatory Visit: Payer: Self-pay

## 2023-08-31 MED ORDER — ALPRAZOLAM 0.5 MG PO TABS: 0.5000 mg | ORAL_TABLET | Freq: Two times a day (BID) | ORAL | 1 refills | Status: DC | PRN

## 2023-08-31 NOTE — Telephone Encounter (Signed)
 Rx refil sent to pt pharmacy.

## 2023-08-31 NOTE — Telephone Encounter (Signed)
 Copied from CRM [id number]. Topic: Clinical - Medication Refill >> Aug 31, 2023  8:17 AM Alcus Dad wrote: Most Recent Primary Care Visit:  Provider: Esperanza Richters  Department: [facility]  Visit Type: OFFICE VISIT  Date: 07/30/2023  Medication: ALPRAZolam Prudy Feeler) 0.5 MG tablet  Has the patient contacted their pharmacy? Yes (Agent: If no, request that the patient contact the pharmacy for the refill. If patient does not wish to contact the pharmacy document the reason why and proceed with request.) (Agent: If yes, when and what did the pharmacy advise?)  Is this the correct pharmacy for this prescription? Yes If no, delete pharmacy and type the correct one.  This is the patient's preferred pharmacy:  [facility] [phone] [place], [place] [phone] [address] AT [facility] OF [address] [place] [address] Phone: [phone] Fax: [phone]   Has the prescription been filled recently? No  Is the patient out of the medication? Yes  Has the patient been seen for an appointment in the last year OR does the patient have an upcoming appointment? Yes  Can we respond through MyChart? Yes  Agent: Please be advised that Rx refills may take up to 3 business days. We ask that you follow-up with your pharmacy.

## 2023-09-03 NOTE — Procedures (Signed)
   HISTORY:-year-old female presenting with prolonged transient confusion  TECHNIQUE:  This is a routine 16 channel EEG recording with one channel devoted to a limited EKG recording.  It was performed during wakefulness, drowsiness and asleep.  Photic stimulation were performed as activating procedures.  There are minimum muscle and movement artifact noted.  Upon maximum arousal, posterior dominant waking rhythm consistent of low amplitude rhythmic alpha range activity. Activities are symmetric over the bilateral posterior derivations and attenuated with eye opening.  Photic stimulation did not alter the tracing.  Hyperventilation was not performed  During EEG recording, patient developed drowsiness and no deeper stage of sleep was achieved  During EEG recording, there was no epileptiform discharge noted.  EKG demonstrate normal sinus rhythm.  CONCLUSION: This is a  normal awake EEG.  There is no electrodiagnostic evidence of epileptiform discharge.  Levert Feinstein, M.D. Ph.D.  [facility] [address] [place], [place] [address] Phone: [phone] Fax:      [phone]

## 2023-09-05 ENCOUNTER — Encounter: Payer: Self-pay | Admitting: Neurology

## 2023-09-11 ENCOUNTER — Telehealth: Payer: Self-pay

## 2023-09-11 NOTE — Telephone Encounter (Signed)
 Pt advised that her results had gone to Dr Tomie China for her monitor due to him being her past Cardiologist and Dr Tenny Craw being her recent cardiologist but any further testing and results will go to Dr Tenny Craw only... she is having a Super D CT of her lung and will let us know if she will need clearance for a potential biopsy.   I will send to Dr Tenny Craw to review her Monitor.

## 2023-09-11 NOTE — Telephone Encounter (Signed)
 Monitor shows afib with good rate control   No new recommendations

## 2023-09-11 NOTE — Telephone Encounter (Signed)
-----   Message from [place] W sent at 09/11/2023  5:26 PM EDT ----- Regarding: FW: Secure Patient Amendment This is the patient [name] Brown told you about.  I am trying to see if I can forward the email as well. ----- Message ----- From: Arrie Eastern D Sent: 09/11/2023   4:19 PM EDT To: Meriel Pica; Shelly A Wells; # Subject: Secure Patient Amendment                       Hello,  We have received an amendment request from this patient. Please refer to your [facility] email to see the amendment request and where to locate your documentation referencing this amendment request. Thank you,

## 2023-09-12 ENCOUNTER — Telehealth: Payer: Self-pay

## 2023-09-12 NOTE — Telephone Encounter (Signed)
-----   Message from HIM Department sent at 09/11/2023  5:16 PM EDT ----- Regarding: RE: Secure Patient Amendment Hi Keidy Thurgood, I would like to be able to assist you but as a non clinical person I cannot document/amend/addend in the way that you can. If you wish to approve the amendment and wish to amend, you can contact IT to get assistance in removing it. Thank you, ----- Message ----- From: Eleonore Chiquito, RN Sent: 09/11/2023   4:51 PM EDT To: Meriel Pica; Hendricks Limes; # Subject: RE: Secure Patient Amendment                   The test was ordered under Dr. Tenny Craw. I am unsure why Dr. Kem Parkinson name was attached to the order. I am unsure how to remove Dr. Kem Parkinson name. ----- Message ----- From: Arrie Eastern D Sent: 09/11/2023   4:21 PM EDT To: Meriel Pica; Young Berry; # Subject: Secure Patient Amendment                       Hello,  We received an amendment request from this patient. Please refer to your [facility] email to see the amendment request and information about where you documented this information in your note. Thank you,

## 2023-09-12 NOTE — Telephone Encounter (Signed)
 Spoke with pt regarding information requesting to be removed. Pt aware that test were ordered was ordered by Dr. Tenny Craw.  Andee Lineman

## 2023-09-13 NOTE — Telephone Encounter (Signed)
 [name] Brown put Revankar as the reader. Unsure why the over-read of the cardiac CT was called to Dr. Tomie Brown. Pt is okay with information not being changed but wanted to make sure that moving forward she would not have issues seeing Dr. Tenny Brown. I advised her that Dr. Tenny Brown has been assigned as her primary cardiologist.

## 2023-09-25 DIAGNOSIS — S0990XA Unspecified injury of head, initial encounter: Secondary | ICD-10-CM | POA: Diagnosis not present

## 2023-09-25 DIAGNOSIS — M25512 Pain in left shoulder: Secondary | ICD-10-CM | POA: Diagnosis not present

## 2023-09-25 DIAGNOSIS — M79622 Pain in left upper arm: Secondary | ICD-10-CM | POA: Diagnosis not present

## 2023-09-25 DIAGNOSIS — M542 Cervicalgia: Secondary | ICD-10-CM | POA: Diagnosis not present

## 2023-09-25 DIAGNOSIS — M7989 Other specified soft tissue disorders: Secondary | ICD-10-CM | POA: Diagnosis not present

## 2023-09-28 ENCOUNTER — Encounter: Payer: Self-pay | Admitting: Student in an Organized Health Care Education/Training Program

## 2023-09-28 ENCOUNTER — Ambulatory Visit (HOSPITAL_BASED_OUTPATIENT_CLINIC_OR_DEPARTMENT_OTHER)
Admission: RE | Admit: 2023-09-28 | Discharge: 2023-09-28 | Disposition: A | Discharge status: Home / Self Care | Payer: Medicare Other | Source: Ambulatory Visit | Attending: Student in an Organized Health Care Education/Training Program | Admitting: Student in an Organized Health Care Education/Training Program

## 2023-09-28 DIAGNOSIS — R918 Other nonspecific abnormal finding of lung field: Secondary | ICD-10-CM | POA: Diagnosis not present

## 2023-09-28 DIAGNOSIS — R911 Solitary pulmonary nodule: Secondary | ICD-10-CM | POA: Insufficient documentation

## 2023-09-28 DIAGNOSIS — I771 Stricture of artery: Secondary | ICD-10-CM | POA: Diagnosis not present

## 2023-09-28 DIAGNOSIS — I700 Atherosclerosis of aorta: Secondary | ICD-10-CM | POA: Diagnosis not present

## 2023-09-28 DIAGNOSIS — R0602 Shortness of breath: Secondary | ICD-10-CM

## 2023-10-01 ENCOUNTER — Encounter: Payer: Self-pay | Admitting: Neurology

## 2023-10-01 ENCOUNTER — Ambulatory Visit
Admission: RE | Admit: 2023-10-01 | Discharge: 2023-10-01 | Disposition: A | Discharge status: Home / Self Care | Source: Ambulatory Visit | Attending: Neurology | Admitting: Neurology

## 2023-10-01 DIAGNOSIS — G454 Transient global amnesia: Secondary | ICD-10-CM

## 2023-10-01 MED ORDER — GADOPICLENOL 0.5 MMOL/ML IV SOLN
10.0000 mL | Freq: Once | INTRAVENOUS | Status: AC | PRN
  Administered 2023-10-01: 10 mL via INTRAVENOUS

## 2023-10-01 NOTE — Telephone Encounter (Signed)
 Patient is scheduled to see Dr. Darnelle Elders on 5/7. I have changed the PFT order.

## 2023-10-03 ENCOUNTER — Encounter: Payer: Self-pay | Admitting: Student in an Organized Health Care Education/Training Program

## 2023-10-03 NOTE — Telephone Encounter (Signed)
 Appt has been scheduled for 3/28 at 10:00am.  Nothing further needed.

## 2023-10-04 NOTE — Telephone Encounter (Signed)
 Filled  Written  Sold  ID  Drug  QTY  Days  Prescriber  RX #  Dispenser  Refill  Daily Dose*  Pymt Type  PMP   09/28/2023 08/31/2023  1 Alprazolam  0.5 Mg Tablet 60.00 30 Ed Sag [id number] [facility] ([id number]) 1/1 2.00 LME Medicare [place]  08/31/2023 08/31/2023  1 Alprazolam  0.5 Mg Tablet 60.00 30 Ed Sag [id number] [facility] ([id number]) 0/1 2.00 LME Medicare [place]  07/28/2023 06/26/2023  1 Alprazolam  0.5 Mg Tablet 60.00 30 Pa Web [id number] [facility] ([id number]) 1/1 2.00 LME Medicare [place]  06/26/2023 06/26/2023  1 Alprazolam  0.5 Mg Tablet 60.00 30 Pa Web [id number] [facility] ([id number]) 0/1 2.00 LME Medicare [place]  05/26/2023 03/29/2023  1 Alprazolam  0.5 Mg Tablet 60.00 30 Ed Sag [id number] [facility] ([id number]) 2/2 2.00 LME Medicare [place]  04/27/2023 03/29/2023  1 Alprazolam  0.5 Mg Tablet 60.00 30 Ed Sag [id number] [facility] ([id number]) 1/2 2.00 LME Medicare 

## 2023-10-08 ENCOUNTER — Ambulatory Visit: Admitting: Student in an Organized Health Care Education/Training Program

## 2023-10-08 DIAGNOSIS — R0602 Shortness of breath: Secondary | ICD-10-CM

## 2023-10-08 LAB — PULMONARY FUNCTION TEST
DL/VA % pred: 114 %
DL/VA: 4.65 ml/min/mmHg/L
DLCO unc % pred: 89 %
DLCO unc: 17.76 ml/min/mmHg
FEF 25-75 Post: 1.31 L/s
FEF 25-75 Pre: 1.14 L/s
FEF2575-%Change-Post: 14 %
FEF2575-%Pred-Post: 85 %
FEF2575-%Pred-Pre: 74 %
FEV1-%Change-Post: 2 %
FEV1-%Pred-Post: 72 %
FEV1-%Pred-Pre: 70 %
FEV1-Post: 1.54 L
FEV1-Pre: 1.5 L
FEV1FVC-%Change-Post: 0 %
FEV1FVC-%Pred-Pre: 101 %
FEV6-%Change-Post: 4 %
FEV6-%Pred-Post: 75 %
FEV6-%Pred-Pre: 72 %
FEV6-Post: 2.05 L
FEV6-Pre: 1.97 L
FEV6FVC-%Change-Post: 1 %
FEV6FVC-%Pred-Post: 105 %
FEV6FVC-%Pred-Pre: 103 %
FVC-%Change-Post: 2 %
FVC-%Pred-Post: 71 %
FVC-%Pred-Pre: 70 %
FVC-Post: 2.05 L
FVC-Pre: 2 L
Post FEV1/FVC ratio: 75 %
Post FEV6/FVC ratio: 100 %
Pre FEV1/FVC ratio: 75 %
Pre FEV6/FVC Ratio: 98 %
RV % pred: 139 %
RV: 3.47 L
TLC % pred: 104 %
TLC: 5.61 L

## 2023-10-08 NOTE — Patient Instructions (Signed)
 Full PFT completed today ? ?

## 2023-10-08 NOTE — Progress Notes (Signed)
 Full PFT completed today ? ?

## 2023-10-13 ENCOUNTER — Encounter: Payer: Self-pay | Admitting: Student in an Organized Health Care Education/Training Program

## 2023-10-17 ENCOUNTER — Encounter: Payer: Self-pay | Admitting: Student in an Organized Health Care Education/Training Program

## 2023-10-17 ENCOUNTER — Ambulatory Visit: Payer: Medicare Other | Admitting: Student in an Organized Health Care Education/Training Program

## 2023-10-17 VITALS — BP 130/82 | HR 79 | Temp 96.9°F | Ht 66.0 in | Wt 236.2 lb

## 2023-10-17 DIAGNOSIS — R911 Solitary pulmonary nodule: Secondary | ICD-10-CM | POA: Diagnosis not present

## 2023-10-17 DIAGNOSIS — R0602 Shortness of breath: Secondary | ICD-10-CM | POA: Diagnosis not present

## 2023-10-17 MED ORDER — FLUTICASONE-SALMETEROL 100-50 MCG/ACT IN AEPB: 1.0000 | INHALATION_SPRAY | Freq: Two times a day (BID) | RESPIRATORY_TRACT | 12 refills | Status: DC

## 2023-10-17 NOTE — Progress Notes (Signed)
 Assessment & Plan:   1. Lung nodule (Primary)  Presenting for the evaluation of a nodule in the right middle lobe which on my evaluation is more akin to an infiltrate versus scarring versus atelectasis.  My differential for this includes mucus impaction, infectious changes, inflammatory changes and least likely malignancy. I have personally reviewed the most recent chest CT which is showing improvement in the opacity in the RML with evidence of scarring, consistent with a healed infection. Other nodules on the CT also appear stable. Discussed this with [name] Brown today in clinic and agreed that given improvement, we will continue with radiographic monitoring. We will obtain a repeat CT in 6 months to continue to monitor the area of concern other subcentimeter nodules.  - CT CHEST WO CONTRAST; Future  2. Shortness of breath  [name] Brown continues to experience shortness of breath on exertion in the setting of atrial fibrillation but no history of smoking. Cardiac workup has been unremarkable, and her rate has been controlled. [name] Brown's underwent PFT's that show normal DLCO and TLC, but do show signs of obstruction on spirometry. This is leading me to suspect reactive airway disease/asthma as a potential cause behind her symptoms. Discussed this at length with [name] Brown and we will attempt a trial of long acting inhalers with LABA/ICS and re-evaluate her symptoms on follow up.  - fluticasone -salmeterol (WIXELA INHUB) 100-50 MCG/ACT AEPB; Inhale 1 puff into the lungs 2 (two) times daily.  Dispense: 60 each; Refill: 12   Return in about 6 months (around 04/18/2024).  I spent 30 minutes caring for this patient today, including preparing to see the patient, obtaining a medical history , reviewing a separately obtained history, performing a medically appropriate examination and/or evaluation, counseling and educating the patient/family/caregiver, ordering medications, tests, or procedures, documenting clinical  information in the electronic health record, and independently interpreting results (not separately reported/billed) and communicating results to the patient/family/caregiver  [name], MD [facility]  End of visit medications:  Meds ordered this encounter  Medications   fluticasone -salmeterol (WIXELA INHUB) 100-50 MCG/ACT AEPB    Sig: Inhale 1 puff into the lungs 2 (two) times daily.    Dispense:  60 each    Refill:  12     Current Outpatient Medications:    acetaminophen  (TYLENOL ) 500 MG tablet, Take 1,500 mg by mouth daily as needed for moderate pain., Disp: , Rfl:    ALPRAZolam  (XANAX ) 0.5 MG tablet, Take 1 tablet (0.5 mg total) by mouth 2 (two) times daily as needed. for anxiety, Disp: 60 tablet, Rfl: 1   ELIQUIS  5 MG TABS tablet, TAKE 1 TABLET(5 MG) BY MOUTH TWICE DAILY, Disp: 60 tablet, Rfl: 11   fluticasone -salmeterol (WIXELA INHUB) 100-50 MCG/ACT AEPB, Inhale 1 puff into the lungs 2 (two) times daily., Disp: 60 each, Rfl: 12   mirtazapine  (REMERON ) 30 MG tablet, TAKE 1 TABLET(30 MG) BY MOUTH AT BEDTIME, Disp: 30 tablet, Rfl: 2   Omega-3 Fatty Acids (FISH OIL) 1000 MG CAPS, Take 2,000 mg by mouth at bedtime., Disp: , Rfl:    propranolol  (INDERAL ) 40 MG tablet, TAKE 1 TABLET(40 MG) BY MOUTH TWICE DAILY, Disp: 180 tablet, Rfl: 0   Subjective:   PATIENT ID: [name] Brown GENDER: female DOB: February 28, 1941, MRN: [id number]  Chief Complaint  Patient presents with   Follow-up    SOB. No wheezing. Occasional dry cough.    HPI  Patient is a pleasant 83 year old female presenting  for follow up of shortness of  breath as well as a pulmonary nodule.  [name] Brown is presenting for follow up today after PFT's and having had a repeat chest CT. [name] Brown continues to experience shortness of breath mostly with exertion. [name] Brown is anxious about the result of the chest CT. There are no new symptoms reported. [name] Brown does not recall if the albuterol  during the PFT made her feel better.  [name] Brown reports an occasional cough that is very sporadic and haphazard.  [name] Brown does not report any sputum production or hemoptysis.   [name] Brown has a history of atrial fibrillation and has been followed closely with cardiology where [name] Brown also underwent an ischemic workup.  [name] Brown has had an echocardiogram which was unremarkable as well as a cardiac PET CT. the cardiac CT was notable for a pulmonary nodule in the right middle lobe for which [name] Brown saw her primary care physician and underwent a chest CT again showing small infiltrate in the right middle lobe.   [name] Brown felt that [name] Brown might of had a cold around the time [name] Brown had her imaging studies.  [name] Brown did not experience any sore throat, myalgias, arthralgias, fevers, or chills at that time but did feel that her cough and postnasal drip were a little more prominent.  [name] Brown reports a history of being admitted to the ICU in 2007 where [name] Brown was told [name] Brown had "pancake lung" but was not intubated at that time.  [name] Brown was previously followed by Dr. Linder Revere in our clinic but this was many years ago.   Patient denies any personal history of smoking but does report significant secondhand smoke exposure (parents, late husband).  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   Ancillary information including prior medications, full medical/surgical/family/social histories, and PFTs (when available) are listed below and have been reviewed.   Review of Systems  Constitutional:  Negative for chills, diaphoresis, fever, malaise/fatigue and weight loss.  Respiratory:  Positive for shortness of breath. Negative for cough, hemoptysis, sputum production and wheezing.   Cardiovascular:  Negative for chest pain.     Objective:   Vitals:   10/17/23 1104  BP: 130/82  Pulse: 79  Temp: (!) 96.9 F (36.1 C)  SpO2: 94%  Weight: 236 lb 3.2 oz (107.1 kg)  Height: 5\' 6"  (1.676 m)   94% on RA BMI Readings from Last 3 Encounters:  10/17/23 38.12  kg/m  10/08/23 37.77 kg/m  08/16/23 35.73 kg/m   Wt Readings from Last 3 Encounters:  10/17/23 236 lb 3.2 oz (107.1 kg)  10/08/23 234 lb (106.1 kg)  08/16/23 235 lb (106.6 kg)    Physical Exam Constitutional:      Appearance: Normal appearance. [name] Brown is obese.  Cardiovascular:     Rate and Rhythm: Normal rate. Rhythm irregular.     Pulses: Normal pulses.     Heart sounds: Normal heart sounds.  Pulmonary:     Effort: Pulmonary effort is normal. No respiratory distress.     Breath sounds: Normal breath sounds. No wheezing or rales.  Neurological:     General: No focal deficit present.     Mental Status: [name] Brown is alert and oriented to person, place, and time. Mental status is at baseline.       Ancillary Information    Past Medical History:  Diagnosis Date   A-fib [facility])    Actinic keratosis 01/15/2007   Centricity Description: ACTINIC KERATOSIS  Qualifier: Diagnosis of   By: Allene Ards  Centricity Description: SOLAR KERATOSIS  Qualifier: Diagnosis of   By: Allene Ards       ACUTE BRONCHITIS 06/27/2010   Qualifier: Diagnosis of   By: Allene Ards       Anxiety state 03/20/2006   Qualifier: Diagnosis of   By: Ambrosio Junker DO, Karen       Cardiac murmur 12/05/2022   Chronic anticoagulation 03/20/2006   Qualifier: Diagnosis of   By: Ambrosio Junker DO, Karen       Depression    DYSPNEA 04/23/2009   Qualifier: Diagnosis of   By: Nadean August       Essential tremor    GERD 04/23/2009   Qualifier: Diagnosis of   By: Nadean August       Hyperlipidemia    INSOMNIA, CHRONIC 07/08/2007   Qualifier: Diagnosis of   By: Ambrosio Junker DO, Karen       Major depressive disorder, recurrent episode (HCC) 03/20/2006   Qualifier: Diagnosis of   By: Allene Ards       Memory loss 08/09/2017   OBESITY, NOS 03/20/2006   Qualifier: Diagnosis of   By: Ambrosio Junker DO, Karen       Persistent atrial fibrillation (HCC) 01/06/2021   Secondary hypercoagulable state (HCC) 01/06/2021   Transient  cerebral ischemia 07/21/2008   Qualifier: Diagnosis of   By: Ambrosio Junker DO, Karen       Transient global amnesia 08/09/2017   Tremor 08/09/2017   UTI 04/28/2009   Qualifier: Diagnosis of   By: Allene Ards         Family History  Problem Relation Age of Onset   Atrial fibrillation Mother    COPD Mother    Atrial fibrillation Father    Lung cancer Father    Atrial fibrillation Sister      Past Surgical History:  Procedure Laterality Date   ABDOMINAL HYSTERECTOMY     APPENDECTOMY     breast biopsies Bilateral    CARDIOVERSION N/A 02/15/2021   Procedure: CARDIOVERSION;  Surgeon: Wendie Hamburg, MD;  Location: [facility] ENDOSCOPY;  Service: Cardiovascular;  Laterality: N/A;   CYST EXCISION N/A 07/10/2016   Procedure: 3CM CYST EXCISION TRUNK;  Surgeon: Alanda Allegra, MD;  Location: [facility] ORS;  Service: General;  Laterality: N/A;   FL INJ LEFT KNEE CT ARTHROGRAM ([facility] HX)     TONSILLECTOMY      Social History   Socioeconomic History   Marital status: Widowed    Spouse name: Not on file   Number of children: 2   Years of education: college   Highest education level: Associate degree: academic program  Occupational History   Occupation: Retired Charity fundraiser  Tobacco Use   Smoking status: Never    Passive exposure: Never   Smokeless tobacco: Never  Vaping Use   Vaping status: Never Used  Substance and Sexual Activity   Alcohol use: Yes    Alcohol/week: 1.0 standard drink of alcohol    Types: 1 Glasses of wine per week    Comment: occasional glass of wine   Drug use: No   Sexual activity: Never  Other Topics Concern   Not on file  Social History Narrative   Lives alone.   Right-handed.   Two children - 1 biological, 1 adopted.   Occasional use of caffeine.   Social Drivers of Corporate investment banker Strain: Low Risk  (10/20/2022)   Overall Financial Resource Strain (CARDIA)    Difficulty of Paying Living Expenses: Not hard at all  Food Insecurity: No Food Insecurity  (10/20/2022)   Hunger Vital Sign    Worried About Running Out of Food in the Last Year: Never true    Ran Out of Food in the Last Year: Never true  Transportation Needs: No Transportation Needs (10/20/2022)   PRAPARE - Administrator, Civil Service (Medical): No    Lack of Transportation (Non-Medical): No  Physical Activity: Insufficiently Active (10/20/2022)   Exercise Vital Sign    Days of Exercise per Week: 3 days    Minutes of Exercise per Session: 20 min  Stress: No Stress Concern Present (10/20/2022)   [employer] of Occupational Health - Occupational Stress Questionnaire    Feeling of Stress : Only a little  Social Connections: Moderately Isolated (10/20/2022)   Social Connection and Isolation Panel [NHANES]    Frequency of Communication with Friends and Family: More than three times a week    Frequency of Social Gatherings with Friends and Family: More than three times a week    Attends Religious Services: More than 4 times per year    Active Member of [employer] or Organizations: No    Attends Banker Meetings: Not on file    Marital Status: Widowed  Intimate Partner Violence: Not At Risk (07/07/2022)   Humiliation, Afraid, Rape, and Kick questionnaire    Fear of Current or Ex-Partner: No    Emotionally Abused: No    Physically Abused: No    Sexually Abused: No     Allergies  Allergen Reactions   Penicillins Anaphylaxis    Has patient had a PCN reaction causing immediate rash, facial/tongue/throat swelling, SOB or lightheadedness with hypotension: Yes Has patient had a PCN reaction causing severe rash involving mucus membranes or skin necrosis: No Has patient had a PCN reaction that required hospitalization Yes Has patient had a PCN reaction occurring within the last 10 years: Yes If all of the above answers are "NO", then may proceed with Cephalosporin use.    Cephalosporins Other (See Comments)    Due to allergic reaction to penicillin    Prochlorperazine Edisylate     REACTION: draws mouth to side     CBC    Component Value Date/Time   WBC 5.8 12/05/2022 1516   WBC 5.6 05/30/2022 0903   RBC 4.98 12/05/2022 1516   RBC 5.04 05/30/2022 0903   HGB 14.6 12/05/2022 1516   HCT 44.5 12/05/2022 1516   PLT 234 12/05/2022 1516   MCV 89 12/05/2022 1516   MCH 29.3 12/05/2022 1516   MCH 30.0 01/24/2021 1336   MCHC 32.8 12/05/2022 1516   MCHC 33.3 05/30/2022 0903   RDW 13.7 12/05/2022 1516   LYMPHSABS 1.5 05/30/2022 0903   MONOABS 0.4 05/30/2022 0903   EOSABS 0.1 05/30/2022 0903   BASOSABS 0.0 05/30/2022 0903    Pulmonary Functions Testing Results:    Latest Ref Rng & Units 10/08/2023    9:57 AM  PFT Results  FVC-Pre L 2.00   FVC-Predicted Pre % 70   FVC-Post L 2.05   FVC-Predicted Post % 71   Pre FEV1/FVC % % 75   Post FEV1/FCV % % 75   FEV1-Pre L 1.50   FEV1-Predicted Pre % 70   FEV1-Post L 1.54   DLCO uncorrected ml/min/mmHg 17.76   DLCO UNC% % 89   DLVA Predicted % 114   TLC L 5.61   TLC % Predicted % 104   RV % Predicted % 139  Outpatient Medications Prior to Visit  Medication Sig Dispense Refill   acetaminophen  (TYLENOL ) 500 MG tablet Take 1,500 mg by mouth daily as needed for moderate pain.     ALPRAZolam  (XANAX ) 0.5 MG tablet Take 1 tablet (0.5 mg total) by mouth 2 (two) times daily as needed. for anxiety 60 tablet 1   ELIQUIS  5 MG TABS tablet TAKE 1 TABLET(5 MG) BY MOUTH TWICE DAILY 60 tablet 11   mirtazapine  (REMERON ) 30 MG tablet TAKE 1 TABLET(30 MG) BY MOUTH AT BEDTIME 30 tablet 2   Omega-3 Fatty Acids (FISH OIL) 1000 MG CAPS Take 2,000 mg by mouth at bedtime.     propranolol  (INDERAL ) 40 MG tablet TAKE 1 TABLET(40 MG) BY MOUTH TWICE DAILY 180 tablet 0   No facility-administered medications prior to visit.

## 2023-10-18 ENCOUNTER — Telehealth: Payer: Self-pay | Admitting: Medical

## 2023-10-18 NOTE — Telephone Encounter (Signed)
 Copied from CRM [id number]. Topic: Medicare AWV >> Oct 18, 2023  9:03 AM Juliana Ocean wrote: Reason for CRM: LVM 10/18/2023 to schedule AWV. Please schedule Virtual or Telehealth visits ONLY.   Rosalee Collins; Care Guide Ambulatory Clinical Support [place] [facility] Direct Dial: [phone]

## 2023-10-19 ENCOUNTER — Encounter: Payer: Self-pay | Admitting: Student in an Organized Health Care Education/Training Program

## 2023-10-26 ENCOUNTER — Other Ambulatory Visit: Payer: Self-pay | Admitting: Medical

## 2023-10-26 NOTE — Telephone Encounter (Signed)
 Requesting: xanax  Contract:07/30/23 UDS:07/30/23 Last Visit:07/1723 Next Visit:n/a Last Refill:08/31/23  Please Advise

## 2023-10-28 ENCOUNTER — Encounter: Payer: Self-pay | Admitting: Student in an Organized Health Care Education/Training Program

## 2023-11-14 ENCOUNTER — Encounter: Payer: Self-pay | Admitting: Medical

## 2023-11-16 MED ORDER — MIRTAZAPINE 30 MG PO TABS
30.0000 mg | ORAL_TABLET | Freq: Every day | ORAL | 0 refills | Status: DC
Start: 2023-11-16 — End: 2023-12-17

## 2023-11-25 DIAGNOSIS — B349 Viral infection, unspecified: Secondary | ICD-10-CM | POA: Diagnosis not present

## 2023-11-25 DIAGNOSIS — R197 Diarrhea, unspecified: Secondary | ICD-10-CM | POA: Diagnosis not present

## 2023-11-25 DIAGNOSIS — I4891 Unspecified atrial fibrillation: Secondary | ICD-10-CM | POA: Diagnosis not present

## 2023-11-25 DIAGNOSIS — R051 Acute cough: Secondary | ICD-10-CM | POA: Diagnosis not present

## 2023-11-25 DIAGNOSIS — R112 Nausea with vomiting, unspecified: Secondary | ICD-10-CM | POA: Diagnosis not present

## 2023-11-25 DIAGNOSIS — R0602 Shortness of breath: Secondary | ICD-10-CM | POA: Diagnosis not present

## 2023-11-25 DIAGNOSIS — Z20822 Contact with and (suspected) exposure to covid-19: Secondary | ICD-10-CM | POA: Diagnosis not present

## 2023-11-25 DIAGNOSIS — R911 Solitary pulmonary nodule: Secondary | ICD-10-CM | POA: Diagnosis not present

## 2023-11-26 DIAGNOSIS — I4891 Unspecified atrial fibrillation: Secondary | ICD-10-CM | POA: Diagnosis not present

## 2023-11-27 ENCOUNTER — Ambulatory Visit: Payer: Self-pay | Admitting: Medical

## 2023-11-27 ENCOUNTER — Ambulatory Visit (INDEPENDENT_AMBULATORY_CARE_PROVIDER_SITE_OTHER): Admitting: Medical

## 2023-11-27 VITALS — BP 120/80 | HR 101 | Wt 233.4 lb

## 2023-11-27 DIAGNOSIS — R059 Cough, unspecified: Secondary | ICD-10-CM | POA: Diagnosis not present

## 2023-11-27 DIAGNOSIS — R5383 Other fatigue: Secondary | ICD-10-CM

## 2023-11-27 DIAGNOSIS — R197 Diarrhea, unspecified: Secondary | ICD-10-CM

## 2023-11-27 MED ORDER — HYDROCODONE BIT-HOMATROP MBR 5-1.5 MG/5ML PO SOLN: 5.0000 mL | Freq: Four times a day (QID) | ORAL | 0 refills | Status: DC | PRN

## 2023-11-27 MED ORDER — FAMCICLOVIR 500 MG PO TABS
500.0000 mg | ORAL_TABLET | Freq: Three times a day (TID) | ORAL | 0 refills | Status: DC
Start: 2023-11-27 — End: 2023-12-27

## 2023-11-27 NOTE — Patient Instructions (Addendum)
 Viral Syndrome with Reactive Airway Disease Wheezing indicates reactive airway disease secondary to viral infection. Consideration of prediabetes in treatment plan due to potential impact of Medrol  on blood glucose. -low sugar diet while on medrol  -recent bnp low and cxr clear/no pneumonia or chg. - Prescribe Medrol  taper (6, 5, 4, 3, 2, 1) for reactive airways. - Prescribe Hycodan for cough, instruct to use only at home, avoid driving, monitor for dizziness or lightheadedness. - Order repeat labs: metabolic panel, CBC, thyroid  studies.  Diarrhea - Provide stool culture kit if loose stools occur, advise bland diet and Imodium post-collection.  Possible Shingles one skin lesion left upper abd.  Early antiviral treatment beneficial if shingles - Prescribe Famvir 500 mg TID for seven days.  Pulmonary Nodule 7 mm pulmonary nodule noted, possibly a granuloma. Referral back pulmonology if cough persists unresponsive to treatment. -keep future already schedule pulmonlogist appt sept.  Prediabetes Blood glucose levels pror A1c 5.8 approximate.  No diabetic medication prescribed.  Follow-up Follow-up to assess treatment response and consider specialist referral. - Follow up in monday to assess treatment response. - Send MyChart message by Friday for treatment update. - Schedule follow-up appointment for next Monday at 8 or 8:20

## 2023-11-27 NOTE — Progress Notes (Signed)
 Subjective:    Patient ID: [name] Brown, female    DOB: 01-02-1941, 83 y.o.   MRN: [id number]  HPI Pt hpi 2 days ago in ED below in   Chief Complaint  Patient presents with  Cough  Vomiting  Diarrhea   HPI:  Patient is a very pleasant 53 female with past medical history of airway disease/asthma, A-fib (Eliquis ), hypertension who presents to the ED with concern for cough. Patient first initially had loose stools last weekend. She thought she was doing better but Wednesday night/Thursday she developed a cough. Reports at times it is productive of clear sputum. She then began having diarrhea/loose stools again and on Friday had 2 episodes of nausea and vomiting. No abdominal pain. No chest pain. No known fever. Reports when she states she did babysit and those 2 kids have a cough but no vomiting or diarrhea. She states she thought that maybe it was a viral illness but has persisted all week prompting her to get checked out. Has been taking Mucinex. Of note, reports she has had shortness of breath ongoing for a while now for which she has seen a pulmonologist, this is unchanged. She had tried Trelegy but has not been taking it while taking Mucinex.   Medical Decision Making: On arrival, pt is afebrile, hemodynamically stable  Differential Diagnosis: Viral URI/viral syndrome, COVID-19, influenza, pneumonia, pulmonary edema/new onset CHF  Denies any chest pain. Abdomen is overall soft and benign, nonfocal, nontender, doubt acute intra-abdominal process such as bowel obstruction. Patient is on Eliquis  and takes this as prescribed, low clinical suspicion for PE  Diagnostics: EKG Reviewed and interpreted by me : A-fib, no obvious ischemic findings  Labs: COVID/influenza negative No leukocytosis CMP grossly unremarkable Lipase within normal limits BNP within normal limits  Imaging Reviewed and interpreted by me: X-ray shows no acute findings. Again noted lung nodule for which patient  is following as an outpatient with pulmonology  ED Course: Patient is given Robitussin cough syrup. She continues to have coarse repetitive cough at bedside. Cough has been present for approximately 5 days, will defer empiric antibiotics as likely viral in this timeframe in absence of fever, leukocytosis, focal lung findings on auscultation or radiographic findings. After shared decision making, will prescribe short course of nightly as needed Hycodan, counseled on drowsy side effects. Discussed using tea and honey during the day. Encouraged to call her doctor tomorrow to arrange follow-up for repeat examination. Patient stable for discharge at this time. Strict return precautions given.   [name] Brown is an 83 year old female who presents with persistent cough, diarrhea, and fatigue.  Her symptoms began a week ago with intermittent diarrhea, including more than one loose stool on Friday, accompanied by three episodes of vomiting. She visited the ER on Sunday, but no specific cause was identified. The diarrhea persisted as of yesterday.  She developed a cough a week ago, which has been productive of large amounts of clear mucus. The cough was particularly severe yesterday and during the night but improved after taking Mucinex this morning. She describes the cough as 'prolonged hard coughing' occurring three times in twenty-five minutes. The mucus tasted different this morning.  She has been experiencing fatigue, weakness, headache, and lightheadedness. She mentions feeling 'miserable' and notes inadequate eating and drinking, with her intake yesterday consisting of a shake, yogurt, and a grilled cheese sandwich.  She has a history of a 7 mm lung nodule noted on a recent chest x-ray. She has  not been diagnosed with COPD and has no history of smoking or asthma. She has experienced shortness of breath and wheezing recently, which she did not have in the past.  Her current medications include  Mucinex, which she started recently, and Hycodan, which she took the night before last but spilled the last dose. She has not taken her Trelegy since last Wednesday due to uncertainty about interactions with Mucinex. No adverse effects from Hycodan, such as drowsiness or dizziness.  Review of Systems  Constitutional:  Positive for fatigue. Negative for chills and fever.  HENT:  Negative for congestion.   Respiratory:  Positive for cough and wheezing. Negative for chest tightness and shortness of breath.   Cardiovascular:  Negative for chest pain and palpitations.  Gastrointestinal:  Positive for diarrhea, nausea and vomiting. Negative for abdominal pain.  Endocrine: Negative for polydipsia and polyuria.  Genitourinary:  Negative for dysuria, hematuria and urgency.  Musculoskeletal:  Negative for back pain and myalgias.  Neurological:  Negative for dizziness, light-headedness, numbness and headaches.       None reported today(not present during office visit)  Hematological:  Negative for adenopathy. Does not bruise/bleed easily.  Psychiatric/Behavioral:  Negative for behavioral problems and decreased concentration.     Past Medical History:  Diagnosis Date   A-fib [facility])    Actinic keratosis 01/15/2007   Centricity Description: ACTINIC KERATOSIS  Qualifier: Diagnosis of   By: Allene Ards      Centricity Description: SOLAR KERATOSIS  Qualifier: Diagnosis of   By: Allene Ards       ACUTE BRONCHITIS 06/27/2010   Qualifier: Diagnosis of   By: Allene Ards       Anxiety state 03/20/2006   Qualifier: Diagnosis of   By: Ambrosio Junker DO, Karen       Cardiac murmur 12/05/2022   Chronic anticoagulation 03/20/2006   Qualifier: Diagnosis of   By: Allene Ards       Depression    DYSPNEA 04/23/2009   Qualifier: Diagnosis of   By: Nadean August       Essential tremor    GERD 04/23/2009   Qualifier: Diagnosis of   By: Nadean August       Hyperlipidemia    INSOMNIA, CHRONIC  07/08/2007   Qualifier: Diagnosis of   By: Ambrosio Junker DO, Karen       Major depressive disorder, recurrent episode (HCC) 03/20/2006   Qualifier: Diagnosis of   By: Allene Ards       Memory loss 08/09/2017   OBESITY, NOS 03/20/2006   Qualifier: Diagnosis of   By: Ambrosio Junker DO, Karen       Persistent atrial fibrillation (HCC) 01/06/2021   Secondary hypercoagulable state (HCC) 01/06/2021   Transient cerebral ischemia 07/21/2008   Qualifier: Diagnosis of   By: Ambrosio Junker DO, Karen       Transient global amnesia 08/09/2017   Tremor 08/09/2017   UTI 04/28/2009   Qualifier: Diagnosis of   By: Allene Ards         Social History   Socioeconomic History   Marital status: Widowed    Spouse name: Not on file   Number of children: 2   Years of education: college   Highest education level: Associate degree: academic program  Occupational History   Occupation: Retired Charity fundraiser  Tobacco Use   Smoking status: Never    Passive exposure: Never   Smokeless tobacco: Never  Vaping Use   Vaping status: Never Used  Substance and  Sexual Activity   Alcohol use: Yes    Alcohol/week: 1.0 standard drink of alcohol    Types: 1 Glasses of wine per week    Comment: occasional glass of wine   Drug use: No   Sexual activity: Never  Other Topics Concern   Not on file  Social History Narrative   Lives alone.   Right-handed.   Two children - 1 biological, 1 adopted.   Occasional use of caffeine.   Social Drivers of Corporate investment banker Strain: Low Risk  (11/26/2023)   Overall Financial Resource Strain (CARDIA)    Difficulty of Paying Living Expenses: Not very hard  Food Insecurity: No Food Insecurity (11/26/2023)   Hunger Vital Sign    Worried About Running Out of Food in the Last Year: Never true    Ran Out of Food in the Last Year: Never true  Transportation Needs: No Transportation Needs (11/26/2023)   PRAPARE - Administrator, Civil Service (Medical): No    Lack of Transportation  (Non-Medical): No  Physical Activity: Inactive (11/26/2023)   Exercise Vital Sign    Days of Exercise per Week: 0 days    Minutes of Exercise per Session: Not on file  Stress: Stress Concern Present (11/26/2023)   [employer] of Occupational Health - Occupational Stress Questionnaire    Feeling of Stress: To some extent  Social Connections: Moderately Isolated (11/26/2023)   Social Connection and Isolation Panel    Frequency of Communication with Friends and Family: More than three times a week    Frequency of Social Gatherings with Friends and Family: Once a week    Attends Religious Services: More than 4 times per year    Active Member of [employer] or Organizations: No    Attends Banker Meetings: Not on file    Marital Status: Widowed  Intimate Partner Violence: Not At Risk (07/07/2022)   Humiliation, Afraid, Rape, and Kick questionnaire    Fear of Current or Ex-Partner: No    Emotionally Abused: No    Physically Abused: No    Sexually Abused: No    Past Surgical History:  Procedure Laterality Date   ABDOMINAL HYSTERECTOMY     APPENDECTOMY     breast biopsies Bilateral    CARDIOVERSION N/A 02/15/2021   Procedure: CARDIOVERSION;  Surgeon: Wendie Hamburg, MD;  Location: [facility] ENDOSCOPY;  Service: Cardiovascular;  Laterality: N/A;   CYST EXCISION N/A 07/10/2016   Procedure: 3CM CYST EXCISION TRUNK;  Surgeon: Alanda Allegra, MD;  Location: [facility] ORS;  Service: General;  Laterality: N/A;   FL INJ LEFT KNEE CT ARTHROGRAM ([facility] HX)     TONSILLECTOMY      Family History  Problem Relation Age of Onset   Atrial fibrillation Mother    COPD Mother    Atrial fibrillation Father    Lung cancer Father    Atrial fibrillation Sister     Allergies  Allergen Reactions   Penicillins Anaphylaxis    Has patient had a PCN reaction causing immediate rash, facial/tongue/throat swelling, SOB or lightheadedness with hypotension: Yes Has patient had a PCN reaction causing severe  rash involving mucus membranes or skin necrosis: No Has patient had a PCN reaction that required hospitalization Yes Has patient had a PCN reaction occurring within the last 10 years: Yes If all of the above answers are NO, then may proceed with Cephalosporin use.    Cephalosporins Other (See Comments)    Due to allergic reaction to  penicillin   Prochlorperazine Edisylate     REACTION: draws mouth to side    Current Outpatient Medications on File Prior to Visit  Medication Sig Dispense Refill   acetaminophen  (TYLENOL ) 500 MG tablet Take 1,500 mg by mouth daily as needed for moderate pain.     ALPRAZolam  (XANAX ) 0.5 MG tablet TAKE 1 TABLET(0.5 MG) BY MOUTH TWICE DAILY AS NEEDED FOR ANXIETY 60 tablet 0   ELIQUIS  5 MG TABS tablet TAKE 1 TABLET(5 MG) BY MOUTH TWICE DAILY 60 tablet 11   HYDROcodone  bit-homatropine (HYCODAN) 5-1.5 MG/5ML syrup Take 5 mLs by mouth.     mirtazapine  (REMERON ) 30 MG tablet Take 1 tablet (30 mg total) by mouth at bedtime. 30 tablet 0   Omega-3 Fatty Acids (FISH OIL) 1000 MG CAPS Take 2,000 mg by mouth at bedtime.     propranolol  (INDERAL ) 40 MG tablet TAKE 1 TABLET(40 MG) BY MOUTH TWICE DAILY 180 tablet 0   fluticasone -salmeterol (WIXELA INHUB) 100-50 MCG/ACT AEPB Inhale 1 puff into the lungs 2 (two) times daily. 60 each 12   No current facility-administered medications on file prior to visit.    BP 120/80   Pulse (!) 101   Wt 233 lb 6.4 oz (105.9 kg)   SpO2 97%   BMI 37.67 kg/m        Objective:   Physical Exam  General Mental Status- Alert. General Appearance- Not in acute distress.   Skin General: Color- Normal Color. Moisture- Normal Moisture.  Neck Carotid Arteries- Normal color. Moisture- Normal Moisture. No carotid bruits. No JVD.  Chest and Lung Exam Auscultation: Breath Sounds:-bilateral upper lobe expiratory wheeze. Otherwise even and unlabored.  Cardiovascular Auscultation:Rythm- Regular. Murmurs & Other Heart Sounds:Auscultation  of the heart reveals- No Murmurs.  Abdomen Inspection:-Inspeection Normal. Palpation/Percussion:Note:No mass. Palpation and Percussion of the abdomen reveal- Non Tender, Non Distended + BS, no rebound or guarding.    Neurologic Cranial Nerve exam:- CN III-XII intact(No nystagmus), symmetric smile. Strength:- 5/5 equal and symmetric strength both upper and lower extremities.    Lower ext- calfs symmetric, negative homans sign. No obvous edema bilaerally.     Assessment & Plan:   Patient Instructions  Viral Syndrome with Reactive Airway Disease Wheezing indicates reactive airway disease secondary to viral infection. Consideration of prediabetes in treatment plan due to potential impact of Medrol  on blood glucose. -low sugar diet while on medrol  -recent bnp low and cxr clear/no pneumonia or chg. - Prescribe Medrol  taper (6, 5, 4, 3, 2, 1) for reactive airways. - Prescribe Hycodan for cough, instruct to use only at home, avoid driving, monitor for dizziness or lightheadedness. - Order repeat labs: metabolic panel, CBC, thyroid  studies.  Diarrhea - Provide stool culture kit if loose stools occur, advise bland diet and Imodium post-collection.  Possible Shingles one skin lesion left upper abd.  Early antiviral treatment beneficial if shingles - Prescribe Famvir 500 mg TID for seven days.  Pulmonary Nodule 7 mm pulmonary nodule noted, possibly a granuloma. Referral back pulmonology if cough persists unresponsive to treatment. -keep future already schedule pulmonlogist appt sept.  Prediabetes Blood glucose levels pror A1c 5.8 approximate.  No diabetic medication prescribed.  Follow-up Follow-up to assess treatment response and consider specialist referral. - Follow up in monday to assess treatment response. - Send MyChart message by Friday for treatment update. - Schedule follow-up appointment for next Monday at 8 or 8:20   Time spent with patient today was 45  minutes which  consisted of chart revdiew, discussing  diagnosis, work up treatment and documentation.

## 2023-11-28 LAB — COMPREHENSIVE METABOLIC PANEL WITH GFR
AG Ratio: 1.4 (calc) (ref 1.0–2.5)
ALT: 18 U/L (ref 6–29)
AST: 18 U/L (ref 10–35)
Albumin: 4.2 g/dL (ref 3.6–5.1)
Alkaline phosphatase (APISO): 63 U/L (ref 37–153)
BUN: 22 mg/dL (ref 7–25)
CO2: 24 mmol/L (ref 20–32)
Calcium: 9.5 mg/dL (ref 8.6–10.4)
Chloride: 106 mmol/L (ref 98–110)
Creat: 0.81 mg/dL (ref 0.60–0.95)
Globulin: 3.1 g/dL (ref 1.9–3.7)
Glucose, Bld: 110 mg/dL — ABNORMAL HIGH (ref 65–99)
Potassium: 4.6 mmol/L (ref 3.5–5.3)
Sodium: 141 mmol/L (ref 135–146)
Total Bilirubin: 0.5 mg/dL (ref 0.2–1.2)
Total Protein: 7.3 g/dL (ref 6.1–8.1)
eGFR: 73 mL/min/{1.73_m2} (ref >=60)

## 2023-11-28 LAB — CBC WITH DIFFERENTIAL/PLATELET
Absolute Lymphocytes: 1808 {cells}/uL (ref 850–3900)
Absolute Monocytes: 472 {cells}/uL (ref 200–950)
Basophils Absolute: 48 {cells}/uL (ref 0–200)
Basophils Relative: 0.6 %
Eosinophils Absolute: 152 {cells}/uL (ref 15–500)
Eosinophils Relative: 1.9 %
HCT: 44.2 % (ref 35.0–45.0)
Hemoglobin: 14.1 g/dL (ref 11.7–15.5)
MCH: 29.1 pg (ref 27.0–33.0)
MCHC: 31.9 g/dL — ABNORMAL LOW (ref 32.0–36.0)
MCV: 91.1 fL (ref 80.0–100.0)
MPV: 9.7 fL (ref 7.5–12.5)
Monocytes Relative: 5.9 %
Neutro Abs: 5520 {cells}/uL (ref 1500–7800)
Neutrophils Relative %: 69 %
Platelets: 244 10*3/uL (ref 140–400)
RBC: 4.85 10*6/uL (ref 3.80–5.10)
RDW: 13.1 % (ref 11.0–15.0)
Total Lymphocyte: 22.6 %
WBC: 8 10*3/uL (ref 3.8–10.8)

## 2023-11-28 LAB — T4, FREE: Free T4: 1.2 ng/dL (ref 0.8–1.8)

## 2023-11-28 LAB — TSH: TSH: 0.6 m[IU]/L (ref 0.40–4.50)

## 2023-11-28 MED ORDER — PREDNISONE 10 MG (21) PO TBPK: ORAL_TABLET | ORAL | 0 refills | Status: DC

## 2023-11-28 NOTE — Addendum Note (Signed)
 Addended by: Serafina Damme on: 11/28/2023 08:09 AM   Modules accepted: Orders

## 2023-11-29 ENCOUNTER — Other Ambulatory Visit: Payer: Self-pay | Admitting: Family

## 2023-11-30 ENCOUNTER — Encounter: Payer: Self-pay | Admitting: Medical

## 2023-11-30 ENCOUNTER — Ambulatory Visit

## 2023-12-02 DIAGNOSIS — G252 Other specified forms of tremor: Secondary | ICD-10-CM | POA: Diagnosis not present

## 2023-12-02 DIAGNOSIS — I4891 Unspecified atrial fibrillation: Secondary | ICD-10-CM | POA: Diagnosis not present

## 2023-12-02 DIAGNOSIS — Z1152 Encounter for screening for COVID-19: Secondary | ICD-10-CM | POA: Diagnosis not present

## 2023-12-02 DIAGNOSIS — E876 Hypokalemia: Secondary | ICD-10-CM | POA: Diagnosis not present

## 2023-12-02 DIAGNOSIS — R0989 Other specified symptoms and signs involving the circulatory and respiratory systems: Secondary | ICD-10-CM | POA: Diagnosis not present

## 2023-12-02 DIAGNOSIS — R0689 Other abnormalities of breathing: Secondary | ICD-10-CM | POA: Diagnosis not present

## 2023-12-02 DIAGNOSIS — R001 Bradycardia, unspecified: Secondary | ICD-10-CM | POA: Diagnosis not present

## 2023-12-02 DIAGNOSIS — Z7901 Long term (current) use of anticoagulants: Secondary | ICD-10-CM | POA: Diagnosis not present

## 2023-12-03 ENCOUNTER — Ambulatory Visit

## 2023-12-03 ENCOUNTER — Ambulatory Visit: Admitting: Medical

## 2023-12-03 ENCOUNTER — Encounter: Payer: Self-pay | Admitting: Medical

## 2023-12-03 DIAGNOSIS — I4891 Unspecified atrial fibrillation: Secondary | ICD-10-CM | POA: Diagnosis not present

## 2023-12-03 NOTE — Telephone Encounter (Signed)
 Requesting: xanax  Contract: 07/30/23 UDS:07/30/23 Last Visit:11/27/23 Next Visit:n/a Last Refill:10/26/23  Please Advise

## 2023-12-04 DIAGNOSIS — I4891 Unspecified atrial fibrillation: Secondary | ICD-10-CM | POA: Diagnosis not present

## 2023-12-04 DIAGNOSIS — R001 Bradycardia, unspecified: Secondary | ICD-10-CM | POA: Diagnosis not present

## 2023-12-04 MED ORDER — ALPRAZOLAM 0.5 MG PO TABS
0.5000 mg | ORAL_TABLET | Freq: Two times a day (BID) | ORAL | 0 refills | Status: DC | PRN
Start: 2023-12-04 — End: 2024-01-11

## 2023-12-04 NOTE — Telephone Encounter (Signed)
Rx refill sent to pt pharmacy 

## 2023-12-07 ENCOUNTER — Encounter: Payer: Self-pay | Admitting: Internal Medicine

## 2023-12-07 ENCOUNTER — Encounter: Payer: Self-pay | Admitting: Medical

## 2023-12-07 ENCOUNTER — Encounter: Payer: Self-pay | Admitting: Student in an Organized Health Care Education/Training Program

## 2023-12-07 NOTE — Telephone Encounter (Signed)
 I spoke with the patient and scheduled her an appt for 7/17 at 2:45pm.  Nothing further needed.

## 2023-12-07 NOTE — Telephone Encounter (Signed)
 Per secure chat with Dr. Isadora- Let's do mid to late July, depending on clinic availability. Thanks!

## 2023-12-13 DIAGNOSIS — L814 Other melanin hyperpigmentation: Secondary | ICD-10-CM | POA: Diagnosis not present

## 2023-12-13 DIAGNOSIS — Z85828 Personal history of other malignant neoplasm of skin: Secondary | ICD-10-CM | POA: Diagnosis not present

## 2023-12-13 DIAGNOSIS — L821 Other seborrheic keratosis: Secondary | ICD-10-CM | POA: Diagnosis not present

## 2023-12-13 DIAGNOSIS — L570 Actinic keratosis: Secondary | ICD-10-CM | POA: Diagnosis not present

## 2023-12-13 DIAGNOSIS — D1801 Hemangioma of skin and subcutaneous tissue: Secondary | ICD-10-CM | POA: Diagnosis not present

## 2023-12-13 DIAGNOSIS — D0359 Melanoma in situ of other part of trunk: Secondary | ICD-10-CM | POA: Diagnosis not present

## 2023-12-13 DIAGNOSIS — D2271 Melanocytic nevi of right lower limb, including hip: Secondary | ICD-10-CM | POA: Diagnosis not present

## 2023-12-16 ENCOUNTER — Other Ambulatory Visit: Payer: Self-pay | Admitting: Medical

## 2023-12-18 ENCOUNTER — Ambulatory Visit: Payer: Self-pay | Admitting: Medical

## 2023-12-18 ENCOUNTER — Ambulatory Visit (HOSPITAL_BASED_OUTPATIENT_CLINIC_OR_DEPARTMENT_OTHER)
Admission: RE | Admit: 2023-12-18 | Discharge: 2023-12-18 | Disposition: A | Discharge status: Home / Self Care | Source: Ambulatory Visit | Attending: Medical | Admitting: Medical

## 2023-12-18 ENCOUNTER — Encounter: Payer: Self-pay | Admitting: Medical

## 2023-12-18 ENCOUNTER — Ambulatory Visit (INDEPENDENT_AMBULATORY_CARE_PROVIDER_SITE_OTHER): Admitting: Medical

## 2023-12-18 VITALS — BP 135/70 | HR 99 | Temp 98.4°F | Ht 67.5 in | Wt 232.6 lb

## 2023-12-18 DIAGNOSIS — R059 Cough, unspecified: Secondary | ICD-10-CM | POA: Diagnosis not present

## 2023-12-18 DIAGNOSIS — Z8701 Personal history of pneumonia (recurrent): Secondary | ICD-10-CM

## 2023-12-18 DIAGNOSIS — R5383 Other fatigue: Secondary | ICD-10-CM

## 2023-12-18 DIAGNOSIS — I4891 Unspecified atrial fibrillation: Secondary | ICD-10-CM | POA: Diagnosis not present

## 2023-12-18 LAB — COMPREHENSIVE METABOLIC PANEL WITH GFR
ALT: 22 U/L (ref 0–35)
AST: 17 U/L (ref 0–37)
Albumin: 3.7 g/dL (ref 3.5–5.2)
Alkaline Phosphatase: 49 U/L (ref 39–117)
BUN: 19 mg/dL (ref 6–23)
CO2: 28 meq/L (ref 19–32)
Calcium: 9.1 mg/dL (ref 8.4–10.5)
Chloride: 106 meq/L (ref 96–112)
Creatinine, Ser: 0.94 mg/dL (ref 0.40–1.20)
GFR: 57.13 mL/min — ABNORMAL LOW (ref >60.00)
Glucose, Bld: 107 mg/dL — ABNORMAL HIGH (ref 70–99)
Potassium: 4.2 meq/L (ref 3.5–5.1)
Sodium: 141 meq/L (ref 135–145)
Total Bilirubin: 0.7 mg/dL (ref 0.2–1.2)
Total Protein: 6.3 g/dL (ref 6.0–8.3)

## 2023-12-18 LAB — CBC WITH DIFFERENTIAL/PLATELET
Basophils Absolute: 0 K/uL (ref 0.0–0.1)
Basophils Relative: 0.6 % (ref 0.0–3.0)
Eosinophils Absolute: 0.1 K/uL (ref 0.0–0.7)
Eosinophils Relative: 1.9 % (ref 0.0–5.0)
HCT: 41.6 % (ref 36.0–46.0)
Hemoglobin: 13.6 g/dL (ref 12.0–15.0)
Lymphocytes Relative: 18.9 % (ref 12.0–46.0)
Lymphs Abs: 1.5 K/uL (ref 0.7–4.0)
MCHC: 32.8 g/dL (ref 30.0–36.0)
MCV: 89.6 fl (ref 78.0–100.0)
Monocytes Absolute: 0.5 K/uL (ref 0.1–1.0)
Monocytes Relative: 6 % (ref 3.0–12.0)
Neutro Abs: 5.6 K/uL (ref 1.4–7.7)
Neutrophils Relative %: 72.6 % (ref 43.0–77.0)
Platelets: 215 K/uL (ref 150.0–400.0)
RBC: 4.64 Mil/uL (ref 3.87–5.11)
RDW: 15 % (ref 11.5–15.5)
WBC: 7.8 K/uL (ref 4.0–10.5)

## 2023-12-18 MED ORDER — NYSTATIN 100000 UNIT/GM EX CREA: 1.0000 | TOPICAL_CREAM | Freq: Two times a day (BID) | CUTANEOUS | 0 refills | Status: DC

## 2023-12-18 NOTE — Patient Instructions (Signed)
 Pneumonia(hx of pneumonia) Recent hospitalization for pneumonia, treated with oxygen, steroids and IV antibiotics. Discharged on oral antibiotics, completed a week ago. Experiencing fatigue and weakness, no fever, chills, or cough. Recovery expected in 1-2 months. -steroid finished on weekend - Follow up with pulmonologist on July 17th.  Fatigue Persistent fatigue post-pneumonia, likely due to recent infection and medication effects. Prednisone  may affect lab results. - Order CBC and metabolic panel to assess current status. -cxr stat  Atrial Fibrillation Persistent atrial fibrillation with recent bradycardia, Inderal  discontinued. On Eliquis  for anticoagulation. Heart rate in low hundreds(low today 99). Awaiting Zio patch results to guide treatment. Urgent cardiologist referral needed due to case complexity. - Refer to cardiologist Dr. Okey urgently. - Contact cardiologist's office to expedite appointment and review Zio patch report. - Continue Eliquis .  Skin itching rt bottom buttock/thick crease. skin normal. possible fungal irritation. -Rx  Nystatin  cream  Follow up date to be determined after lab and imaging review.

## 2023-12-18 NOTE — Progress Notes (Signed)
 Subjective:    Patient ID: [name] Brown, female    DOB: Apr 08, 1941, 83 y.o.   MRN: [id number]  HPI [name] Brown is an 83 year old female with recent pneumonia who presents with persistent fatigue and weakness.  She was recently hospitalized for pneumonia. Initially, a chest x-ray was clear in ED. She followed up with me in late july. But signs/symptoms worsened despite prednisone . Then 2 days after she saw me went to ED again for further evaluation. Subsequent CT scan revealed pneumonia. Her oxygen saturation was in the 90s, leading to her admission where she received IV antibiotics, including rocephin and doxycycline, for five days. She was discharged with oral antibiotics, cefdinir and doxycycline, for three days, and a prescription for prednisone , which she completed on this past Saturday.  Since discharge, she experiences persistent fatigue and weakness, although she is able to perform daily activities such as cooking, showering, and running errands. She drove herself to the appointment and manages independently at home. Her cough resolved two days ago. No fevers, chills, sweats, wheezing, or shortness of breath. She can take deep breaths without coughing, which was not possible during her hospitalization.  She has a history of atrial fibrillation and was on Inderal , which was discontinued due to episodes of bradycardia with heart rates dropping into the 40s and 30s. She wore a Zio patch for seven days to monitor her heart rhythm, and she is awaiting results. She is currently on Eliquis  for her atrial fibrillation.  Recent lab work showed a white blood cell count of 12.2, a GFR of 51, and a blood sugar level of 106.   Ct study done on 12-02-2023 1.  No acute pulmonary thromboembolic disease.  2.  Focal region of airspace consolidation in the medial segment of left lower lobe with widespread lower lobe predominant bronchial wall thickening and mucoid impaction, could reflect multifocal  bronchopneumonia and/or aspiration.   Review of Systems  Constitutional:  Positive for fatigue. Negative for chills and fever.  Respiratory:  Negative for cough, chest tightness and wheezing.   Cardiovascular:  Negative for chest pain and palpitations.  Gastrointestinal:  Negative for abdominal pain.  Genitourinary:  Negative for dysuria and frequency.  Musculoskeletal:  Negative for back pain.  Skin:  Negative for rash.  Neurological:  Negative for seizures, facial asymmetry and weakness.  Hematological:  Negative for adenopathy.  Psychiatric/Behavioral:  Negative for behavioral problems and confusion.     Past Medical History:  Diagnosis Date   A-fib [facility])    Actinic keratosis 01/15/2007   Centricity Description: ACTINIC KERATOSIS  Qualifier: Diagnosis of   By: Waylan ROSALEA Pao      Centricity Description: SOLAR KERATOSIS  Qualifier: Diagnosis of   By: Waylan ROSALEA Pao       ACUTE BRONCHITIS 06/27/2010   Qualifier: Diagnosis of   By: Waylan ROSALEA Pao       Anxiety state 03/20/2006   Qualifier: Diagnosis of   By: Waylan DO, Karen       Cardiac murmur 12/05/2022   Chronic anticoagulation 03/20/2006   Qualifier: Diagnosis of   By: Waylan ROSALEA Pao       Depression    DYSPNEA 04/23/2009   Qualifier: Diagnosis of   By: Gwenn Grimes       Essential tremor    GERD 04/23/2009   Qualifier: Diagnosis of   By: Gwenn Grimes       Hyperlipidemia    INSOMNIA, CHRONIC 07/08/2007   Qualifier: Diagnosis of  By: Waylan ROSALEA Pao       Major depressive disorder, recurrent episode (HCC) 03/20/2006   Qualifier: Diagnosis of   By: Waylan ROSALEA Pao       Memory loss 08/09/2017   OBESITY, NOS 03/20/2006   Qualifier: Diagnosis of   By: Waylan DO, Karen       Persistent atrial fibrillation (HCC) 01/06/2021   Secondary hypercoagulable state (HCC) 01/06/2021   Transient cerebral ischemia 07/21/2008   Qualifier: Diagnosis of   By: Waylan DO, Karen       Transient global amnesia 08/09/2017    Tremor 08/09/2017   UTI 04/28/2009   Qualifier: Diagnosis of   By: Waylan ROSALEA Pao         Social History   Socioeconomic History   Marital status: Widowed    Spouse name: Not on file   Number of children: 2   Years of education: college   Highest education level: Associate degree: academic program  Occupational History   Occupation: Retired Charity fundraiser  Tobacco Use   Smoking status: Never    Passive exposure: Never   Smokeless tobacco: Never  Vaping Use   Vaping status: Never Used  Substance and Sexual Activity   Alcohol use: Yes    Alcohol/week: 1.0 standard drink of alcohol    Types: 1 Glasses of wine per week    Comment: occasional glass of wine   Drug use: No   Sexual activity: Never  Other Topics Concern   Not on file  Social History Narrative   Lives alone.   Right-handed.   Two children - 1 biological, 1 adopted.   Occasional use of caffeine.   Social Drivers of Corporate investment banker Strain: Low Risk  (11/26/2023)   Overall Financial Resource Strain (CARDIA)    Difficulty of Paying Living Expenses: Not very hard  Food Insecurity: Low Risk  (12/02/2023)   Received from [facility]   Hunger Vital Sign    Within the past 12 months, you worried that your food would run out before you got money to buy more: Never true    Within the past 12 months, the food you bought just didn't last and you didn't have money to get more. : Never true  Transportation Needs: No Transportation Needs (12/02/2023)   [facility]   Transportation    In the past 12 months, has lack of reliable transportation kept you from medical appointments, meetings, work or from getting things needed for daily living? : No  Physical Activity: Inactive (11/26/2023)   Exercise Vital Sign    Days of Exercise per Week: 0 days    Minutes of Exercise per Session: Not on file  Stress: Stress Concern Present (11/26/2023)   [employer] of Occupational Health - Occupational Stress  Questionnaire    Feeling of Stress: To some extent  Social Connections: Moderately Isolated (11/26/2023)   Social Connection and Isolation Panel    Frequency of Communication with Friends and Family: More than three times a week    Frequency of Social Gatherings with Friends and Family: Once a week    Attends Religious Services: More than 4 times per year    Active Member of [employer] or Organizations: No    Attends Banker Meetings: Not on file    Marital Status: Widowed  Intimate Partner Violence: Not At Risk (07/07/2022)   Humiliation, Afraid, Rape, and Kick questionnaire    Fear of Current or Ex-Partner: No  Emotionally Abused: No    Physically Abused: No    Sexually Abused: No    Past Surgical History:  Procedure Laterality Date   ABDOMINAL HYSTERECTOMY     APPENDECTOMY     breast biopsies Bilateral    CARDIOVERSION N/A 02/15/2021   Procedure: CARDIOVERSION;  Surgeon: Kate Lonni CROME, MD;  Location: [facility] ENDOSCOPY;  Service: Cardiovascular;  Laterality: N/A;   CYST EXCISION N/A 07/10/2016   Procedure: 3CM CYST EXCISION TRUNK;  Surgeon: Oneil Budge, MD;  Location: [facility] ORS;  Service: General;  Laterality: N/A;   FL INJ LEFT KNEE CT ARTHROGRAM ([facility] HX)     TONSILLECTOMY      Family History  Problem Relation Age of Onset   Atrial fibrillation Mother    COPD Mother    Atrial fibrillation Father    Lung cancer Father    Atrial fibrillation Sister     Allergies  Allergen Reactions   Penicillins Anaphylaxis    Has patient had a PCN reaction causing immediate rash, facial/tongue/throat swelling, SOB or lightheadedness with hypotension: Yes Has patient had a PCN reaction causing severe rash involving mucus membranes or skin necrosis: No Has patient had a PCN reaction that required hospitalization Yes Has patient had a PCN reaction occurring within the last 10 years: Yes If all of the above answers are NO, then may proceed with Cephalosporin use.     Cephalosporins Other (See Comments)    Due to allergic reaction to penicillin   Prochlorperazine Edisylate     REACTION: draws mouth to side    Current Outpatient Medications on File Prior to Visit  Medication Sig Dispense Refill   acetaminophen  (TYLENOL ) 500 MG tablet Take 1,500 mg by mouth daily as needed for moderate pain.     ALPRAZolam  (XANAX ) 0.5 MG tablet Take 1 tablet (0.5 mg total) by mouth 2 (two) times daily as needed for anxiety. 60 tablet 0   ELIQUIS  5 MG TABS tablet TAKE 1 TABLET(5 MG) BY MOUTH TWICE DAILY 60 tablet 11   HYDROcodone  bit-homatropine (HYCODAN) 5-1.5 MG/5ML syrup Take 5 mLs by mouth every 6 (six) hours as needed for cough. 70 mL 0   mirtazapine  (REMERON ) 30 MG tablet TAKE 1 TABLET(30 MG) BY MOUTH AT BEDTIME 30 tablet 0   Omega-3 Fatty Acids (FISH OIL) 1000 MG CAPS Take 2,000 mg by mouth at bedtime.     ALPRAZolam  (XANAX ) 0.5 MG tablet TAKE 1 TABLET(0.5 MG) BY MOUTH TWICE DAILY AS NEEDED FOR ANXIETY 60 tablet 0   famciclovir  (FAMVIR ) 500 MG tablet Take 1 tablet (500 mg total) by mouth 3 (three) times daily. 21 tablet 0   fluticasone -salmeterol (WIXELA INHUB) 100-50 MCG/ACT AEPB Inhale 1 puff into the lungs 2 (two) times daily. 60 each 12   predniSONE  (STERAPRED UNI-PAK 21 TAB) 10 MG (21) TBPK tablet Taper over 6 days. 21 tablet 0   propranolol  (INDERAL ) 40 MG tablet TAKE 1 TABLET(40 MG) BY MOUTH TWICE DAILY 180 tablet 0   No current facility-administered medications on file prior to visit.    BP 135/70   Pulse 99 Comment: lowest but did hover around low 100s.  Temp 98.4 F (36.9 C) (Oral)   Ht 5' 7.5 (1.715 m)   Wt 232 lb 9.6 oz (105.5 kg)   SpO2 100%   BMI 35.89 kg/m        Objective:   Physical Exam  General Mental Status- Alert. General Appearance- Not in acute distress.   Skin General: Color- Normal Color. Moisture-  Normal Moisture.  Neck Carotid Arteries- Normal color. Moisture- Normal Moisture. No carotid bruits. No JVD.  Chest and Lung  Exam Auscultation: Breath Sounds:-CTA  Cardiovascular Auscultation:Rythm- irregular regular(atrial fib like) Murmurs & Other Heart Sounds:Auscultation of the heart reveals- No Murmurs.  Abdomen Inspection:-Inspeection Normal.  Palpation/Percussion:Note:No mass. Palpation and Percussion of the abdomen reveal- Non Tender, Non Distended + BS, no rebound or guarding.   Neurologic Cranial Nerve exam:- CN III-XII intact(No nystagmus), symmetric smile. Strength:- 5/5 equal and symmetric strength both upper and lower extremities.   Skin- rt buttock area.`    Assessment & Plan:   Patient Instructions  Pneumonia(hx of pneumonia) Recent hospitalization for pneumonia, treated with oxygen, steroids and IV antibiotics. Discharged on oral antibiotics, completed a week ago. Experiencing fatigue and weakness, no fever, chills, or cough. Recovery expected in 1-2 months. -steroid finished on weekend - Follow up with pulmonologist on July 17th.  Fatigue Persistent fatigue post-pneumonia, likely due to recent infection and medication effects. Prednisone  may affect lab results. - Order CBC and metabolic panel to assess current status. -cxr stat  Atrial Fibrillation Persistent atrial fibrillation with recent bradycardia, Inderal  discontinued. On Eliquis  for anticoagulation. Heart rate in low hundreds(low today 99). Awaiting Zio patch results to guide treatment. Urgent cardiologist referral needed due to case complexity. - Refer to cardiologist Dr. Okey urgently. - Contact cardiologist's office to expedite appointment and review Zio patch report. - Continue Eliquis .  Skin itching rt bottom buttock/thick crease. skin normal. possible fungal irritation. -Rx  Nystatin  cream  Follow up date to be determined after lab and imaging review.   Time spent with patient today was  41 minutes which consisted of chart revdiew, discussing diagnosis, work up treatment and documentation.

## 2023-12-19 ENCOUNTER — Encounter: Payer: Self-pay | Admitting: Internal Medicine

## 2023-12-21 ENCOUNTER — Telehealth: Payer: Self-pay | Admitting: Internal Medicine

## 2023-12-21 NOTE — Telephone Encounter (Signed)
 Patient states that she received a call from our office this morning but a message was not left. She is waiting to hear back about monitor results that was ordered through [facility]. Patient is going to contact [facility] and give them our fax number so that Dr. Okey can review the results.

## 2023-12-21 NOTE — Telephone Encounter (Signed)
 Patient stated she is returning a call from the nurse regarding her heart monitor results.

## 2023-12-22 DIAGNOSIS — I517 Cardiomegaly: Secondary | ICD-10-CM | POA: Diagnosis not present

## 2023-12-22 DIAGNOSIS — R42 Dizziness and giddiness: Secondary | ICD-10-CM | POA: Diagnosis not present

## 2023-12-22 DIAGNOSIS — R600 Localized edema: Secondary | ICD-10-CM | POA: Diagnosis not present

## 2023-12-22 DIAGNOSIS — I4891 Unspecified atrial fibrillation: Secondary | ICD-10-CM | POA: Diagnosis not present

## 2023-12-22 DIAGNOSIS — E876 Hypokalemia: Secondary | ICD-10-CM | POA: Diagnosis not present

## 2023-12-22 DIAGNOSIS — Z7901 Long term (current) use of anticoagulants: Secondary | ICD-10-CM | POA: Diagnosis not present

## 2023-12-23 ENCOUNTER — Other Ambulatory Visit: Payer: Self-pay | Admitting: Medical

## 2023-12-23 ENCOUNTER — Encounter: Payer: Self-pay | Admitting: Internal Medicine

## 2023-12-23 DIAGNOSIS — I4819 Other persistent atrial fibrillation: Secondary | ICD-10-CM

## 2023-12-24 NOTE — Telephone Encounter (Signed)
 Last refill was 12/17/2023

## 2023-12-24 NOTE — Telephone Encounter (Signed)
 Add patient in to my schedule on Monday  Try to get Zio patch

## 2023-12-25 DIAGNOSIS — I4891 Unspecified atrial fibrillation: Secondary | ICD-10-CM | POA: Diagnosis not present

## 2023-12-25 DIAGNOSIS — R000 Tachycardia, unspecified: Secondary | ICD-10-CM | POA: Diagnosis not present

## 2023-12-25 DIAGNOSIS — I498 Other specified cardiac arrhythmias: Secondary | ICD-10-CM | POA: Diagnosis not present

## 2023-12-25 DIAGNOSIS — I4729 Other ventricular tachycardia: Secondary | ICD-10-CM | POA: Diagnosis not present

## 2023-12-26 ENCOUNTER — Ambulatory Visit: Attending: Internal Medicine

## 2023-12-26 DIAGNOSIS — I4819 Other persistent atrial fibrillation: Secondary | ICD-10-CM

## 2023-12-26 NOTE — Progress Notes (Unsigned)
 Enrolled patient for a 14 day Zio XT  monitor to be mailed to patients home

## 2023-12-27 ENCOUNTER — Encounter: Payer: Self-pay | Admitting: Student in an Organized Health Care Education/Training Program

## 2023-12-27 ENCOUNTER — Ambulatory Visit: Admitting: Student in an Organized Health Care Education/Training Program

## 2023-12-27 VITALS — BP 106/80 | HR 114 | Temp 98.1°F | Ht 67.0 in | Wt 233.0 lb

## 2023-12-27 DIAGNOSIS — R911 Solitary pulmonary nodule: Secondary | ICD-10-CM | POA: Diagnosis not present

## 2023-12-27 DIAGNOSIS — J4520 Mild intermittent asthma, uncomplicated: Secondary | ICD-10-CM

## 2023-12-27 DIAGNOSIS — R0602 Shortness of breath: Secondary | ICD-10-CM | POA: Diagnosis not present

## 2023-12-27 NOTE — Progress Notes (Signed)
 Assessment & Plan:   #Lung Nodule  Follow up of a RML pulmonary nodule (appeared more so like an infiltrate on imaging on my review) more akin to an infiltrate versus scarring versus atelectasis.  My differential for this includes mucus impaction, infectious changes, inflammatory changes and least likely malignancy. I have personally reviewed her last chest CT here which is showed improvement in the opacity in the RML with evidence of scarring, consistent with a healed infection. Report from her [facility] Chest CT has no mention of this finding, but with LLL infiltrate that was treated with antibiotics. She has an upcoming chest CT that was ordered during our previous visit that I will review and compare to her prior imaging. Will attempt to obtain her [facility] films and import into our system for comparism.  -CT chest at 6 month mark, previously ordered -import hospital Chest CT for comparison  #Shortness of Breath  She continues to experience shortness of breath on exertion in the setting of atrial fibrillation but no history of smoking. Cardiac workup has been unremarkable, and her rate has been controlled. She's underwent PFT's that show normal DLCO and TLC, but do show signs of obstruction on spirometry. This is leading me to suspect reactive airway disease/asthma as a potential cause behind her symptoms. We had initiated LABA/ICS during our previous visit which she has tolerated well and we will continue.  -Continue ICS/LABA with Wixela 100-50 one puff twice daily  Return in about 5 months (around 05/28/2024).  I spent 33 minutes caring for this patient today, including preparing to see the patient, obtaining a medical history , reviewing a separately obtained history, performing a medically appropriate examination and/or evaluation, counseling and educating the patient/family/caregiver, ordering medications, tests, or procedures, documenting clinical information in the electronic  health record, and independently interpreting results (not separately reported/billed) and communicating results to the patient/family/caregiver  [name] November, MD [facility]  End of visit medications:  No orders of the defined types were placed in this encounter.    Current Outpatient Medications:    acetaminophen  (TYLENOL ) 500 MG tablet, Take 1,500 mg by mouth daily as needed for moderate pain., Disp: , Rfl:    ALPRAZolam  (XANAX ) 0.5 MG tablet, Take 1 tablet (0.5 mg total) by mouth 2 (two) times daily as needed for anxiety., Disp: 60 tablet, Rfl: 0   ELIQUIS  5 MG TABS tablet, TAKE 1 TABLET(5 MG) BY MOUTH TWICE DAILY, Disp: 60 tablet, Rfl: 11   mirtazapine  (REMERON ) 30 MG tablet, TAKE 1 TABLET(30 MG) BY MOUTH AT BEDTIME, Disp: 30 tablet, Rfl: 0   nystatin  cream (MYCOSTATIN ), Apply 1 Application topically 2 (two) times daily., Disp: 30 g, Rfl: 0   Omega-3 Fatty Acids (FISH OIL) 1000 MG CAPS, Take 2,000 mg by mouth at bedtime., Disp: , Rfl:    fluticasone -salmeterol (WIXELA INHUB) 100-50 MCG/ACT AEPB, Inhale 1 puff into the lungs 2 (two) times daily., Disp: 60 each, Rfl: 12   Subjective:   PATIENT ID: [name] Brown GENDER: female DOB: 02-10-1941, MRN: [id number]  Chief Complaint  Patient presents with   Hospitalization Follow-up    Dry cough last couple of days.    HPI  Patient is a pleasant 83 year old female presenting for follow up.  She has a history of atrial fibrillation and has been followed closely with cardiology where she also underwent an ischemic workup.  She has had an echocardiogram which was unremarkable as well as a cardiac PET CT. the cardiac CT  was notable for a pulmonary nodule in the right middle lobe for which she saw her primary care physician and underwent a chest CT again showing small infiltrate in the right middle lobe.  She felt that she might of had a cold around the time she had her imaging studies.  She did not experience any sore  throat, myalgias, arthralgias, fevers, or chills at that time but did feel that her cough and postnasal drip were a little more prominent.  She reports a history of being admitted to the ICU in 2007 where she was told she had pancake lung but was not intubated at that time.  She was previously followed by Dr. Neysa in our clinic but this was many years ago.  Return Visit 10/17/2023: She is presenting for follow up today after PFT's and having had a repeat chest CT. She continues to experience shortness of breath mostly with exertion. She is anxious about the result of the chest CT. There are no new symptoms reported. She does not recall if the albuterol  during the PFT made her feel better. She reports an occasional cough that is very sporadic and haphazard.  She does not report any sputum production or hemoptysis.    Return Visit 12/27/2023: She was recently admitted to [facility] where she was treated for pneumonia with antibiotics. The CT scan while hospitalized showed a left lower lobe infiltrate. She was asked to follow up with pulmonary after discharge. She feels well and her symptoms are improved. Her cough and sputum production are significantly improved. She is compliant with her inhalers. She feels her heart rate control is contributing to some of her symptoms at the time of this visit.  CT Chest 12/02/2023 (Care Everywhere): Lungs/pleura: Focal region of airspace consolidation in the medial segment of the left lower lobe measuring up to 18 mm. Associated lower lobe predominant bronchial wall thickening with scattered areas of mucoid impaction. Scattered regions of groundglass opacification in the lung bases. Calcified lingular granuloma.    Patient denies any personal history of smoking but does report significant secondhand smoke exposure (parents, late husband).  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  Ancillary information including  prior medications, full medical/surgical/family/social histories, and PFTs (when available) are listed below and have been reviewed.   Review of Systems  Constitutional:  Negative for chills, diaphoresis, fever, malaise/fatigue and weight loss.  Respiratory:  Negative for cough, hemoptysis, sputum production, shortness of breath and wheezing.   Cardiovascular:  Negative for chest pain.     Objective:   Vitals:   12/27/23 1502  BP: 106/80  Pulse: (!) 114  Temp: 98.1 F (36.7 C)  TempSrc: Oral  SpO2: 95%  Weight: 233 lb (105.7 kg)  Height: 5' 7 (1.702 m)   95% on RA  BMI Readings from Last 3 Encounters:  12/27/23 36.49 kg/m  12/18/23 35.89 kg/m  11/27/23 37.67 kg/m   Wt Readings from Last 3 Encounters:  12/27/23 233 lb (105.7 kg)  12/18/23 232 lb 9.6 oz (105.5 kg)  11/27/23 233 lb 6.4 oz (105.9 kg)    Physical Exam Constitutional:      Appearance: Normal appearance. She is obese.  Cardiovascular:     Rate and Rhythm: Normal rate. Rhythm irregular.     Pulses: Normal pulses.     Heart sounds: Normal heart sounds.  Pulmonary:     Effort: Pulmonary effort is normal. No respiratory distress.  Breath sounds: Normal breath sounds. No wheezing or rales.  Neurological:     General: No focal deficit present.     Mental Status: She is alert and oriented to person, place, and time. Mental status is at baseline.       Ancillary Information    Past Medical History:  Diagnosis Date   A-fib [facility])    Actinic keratosis 01/15/2007   Centricity Description: ACTINIC KERATOSIS  Qualifier: Diagnosis of   By: Waylan ROSALEA Pao      Centricity Description: SOLAR KERATOSIS  Qualifier: Diagnosis of   By: Waylan ROSALEA Pao       ACUTE BRONCHITIS 06/27/2010   Qualifier: Diagnosis of   By: Waylan ROSALEA Pao       Anxiety state 03/20/2006   Qualifier: Diagnosis of   By: Waylan DO, Karen       Cardiac murmur 12/05/2022   Chronic anticoagulation 03/20/2006   Qualifier: Diagnosis of    By: Waylan ROSALEA Pao       Depression    DYSPNEA 04/23/2009   Qualifier: Diagnosis of   By: Gwenn Grimes       Essential tremor    GERD 04/23/2009   Qualifier: Diagnosis of   By: Gwenn Grimes       Hyperlipidemia    INSOMNIA, CHRONIC 07/08/2007   Qualifier: Diagnosis of   By: Waylan DO, Karen       Major depressive disorder, recurrent episode (HCC) 03/20/2006   Qualifier: Diagnosis of   By: Waylan ROSALEA Pao       Memory loss 08/09/2017   OBESITY, NOS 03/20/2006   Qualifier: Diagnosis of   By: Waylan DO, Karen       Persistent atrial fibrillation (HCC) 01/06/2021   Secondary hypercoagulable state (HCC) 01/06/2021   Transient cerebral ischemia 07/21/2008   Qualifier: Diagnosis of   By: Waylan DO, Karen       Transient global amnesia 08/09/2017   Tremor 08/09/2017   UTI 04/28/2009   Qualifier: Diagnosis of   By: Waylan ROSALEA Pao         Family History  Problem Relation Age of Onset   Atrial fibrillation Mother    COPD Mother    Atrial fibrillation Father    Lung cancer Father    Atrial fibrillation Sister      Past Surgical History:  Procedure Laterality Date   ABDOMINAL HYSTERECTOMY     APPENDECTOMY     breast biopsies Bilateral    CARDIOVERSION N/A 02/15/2021   Procedure: CARDIOVERSION;  Surgeon: Kate Lonni CROME, MD;  Location: [facility] ENDOSCOPY;  Service: Cardiovascular;  Laterality: N/A;   CYST EXCISION N/A 07/10/2016   Procedure: 3CM CYST EXCISION TRUNK;  Surgeon: Oneil Budge, MD;  Location: [facility] ORS;  Service: General;  Laterality: N/A;   FL INJ LEFT KNEE CT ARTHROGRAM ([facility] HX)     TONSILLECTOMY      Social History   Socioeconomic History   Marital status: Widowed    Spouse name: Not on file   Number of children: 2   Years of education: college   Highest education level: Associate degree: academic program  Occupational History   Occupation: Retired Charity fundraiser  Tobacco Use   Smoking status: Never    Passive exposure: Never   Smokeless tobacco: Never  Vaping  Use   Vaping status: Never Used  Substance and Sexual Activity   Alcohol use: Yes    Alcohol/week: 1.0 standard drink of alcohol    Types: 1 Glasses of  wine per week    Comment: occasional glass of wine   Drug use: No   Sexual activity: Never  Other Topics Concern   Not on file  Social History Narrative   Lives alone.   Right-handed.   Two children - 1 biological, 1 adopted.   Occasional use of caffeine.   Social Drivers of Corporate investment banker Strain: Low Risk  (11/26/2023)   Overall Financial Resource Strain (CARDIA)    Difficulty of Paying Living Expenses: Not very hard  Food Insecurity: Low Risk  (12/02/2023)   Received from [facility]   Hunger Vital Sign    Within the past 12 months, you worried that your food would run out before you got money to buy more: Never true    Within the past 12 months, the food you bought just didn't last and you didn't have money to get more. : Never true  Transportation Needs: No Transportation Needs (12/02/2023)   Received from [facility]   Transportation    In the past 12 months, has lack of reliable transportation kept you from medical appointments, meetings, work or from getting things needed for daily living? : No  Physical Activity: Inactive (11/26/2023)   Exercise Vital Sign    Days of Exercise per Week: 0 days    Minutes of Exercise per Session: Not on file  Stress: Stress Concern Present (11/26/2023)   [employer] of Occupational Health - Occupational Stress Questionnaire    Feeling of Stress: To some extent  Social Connections: Moderately Isolated (11/26/2023)   Social Connection and Isolation Panel    Frequency of Communication with Friends and Family: More than three times a week    Frequency of Social Gatherings with Friends and Family: Once a week    Attends Religious Services: More than 4 times per year    Active Member of [employer] or Organizations: No    Attends Banker Meetings: Not on file     Marital Status: Widowed  Intimate Partner Violence: Not At Risk (07/07/2022)   Humiliation, Afraid, Rape, and Kick questionnaire    Fear of Current or Ex-Partner: No    Emotionally Abused: No    Physically Abused: No    Sexually Abused: No     Allergies  Allergen Reactions   Penicillins Anaphylaxis    Has patient had a PCN reaction causing immediate rash, facial/tongue/throat swelling, SOB or lightheadedness with hypotension: Yes Has patient had a PCN reaction causing severe rash involving mucus membranes or skin necrosis: No Has patient had a PCN reaction that required hospitalization Yes Has patient had a PCN reaction occurring within the last 10 years: Yes If all of the above answers are NO, then may proceed with Cephalosporin use.    Cephalosporins Other (See Comments)    Due to allergic reaction to penicillin   Prochlorperazine Edisylate     REACTION: draws mouth to side     CBC    Component Value Date/Time   WBC 7.8 12/18/2023 1158   RBC 4.64 12/18/2023 1158   HGB 13.6 12/18/2023 1158   HGB 14.6 12/05/2022 1516   HCT 41.6 12/18/2023 1158   HCT 44.5 12/05/2022 1516   PLT 215.0 12/18/2023 1158   PLT 234 12/05/2022 1516   MCV 89.6 12/18/2023 1158   MCV 89 12/05/2022 1516   MCH 29.1 11/27/2023 1621   MCHC 32.8 12/18/2023 1158   RDW 15.0 12/18/2023 1158   RDW 13.7 12/05/2022 1516   LYMPHSABS 1.5  12/18/2023 1158   MONOABS 0.5 12/18/2023 1158   EOSABS 0.1 12/18/2023 1158   BASOSABS 0.0 12/18/2023 1158    Pulmonary Functions Testing Results:    Latest Ref Rng & Units 10/08/2023    9:57 AM  PFT Results  FVC-Pre L 2.00   FVC-Predicted Pre % 70   FVC-Post L 2.05   FVC-Predicted Post % 71   Pre FEV1/FVC % % 75   Post FEV1/FCV % % 75   FEV1-Pre L 1.50   FEV1-Predicted Pre % 70   FEV1-Post L 1.54   DLCO uncorrected ml/min/mmHg 17.76   DLCO UNC% % 89   DLVA Predicted % 114   TLC L 5.61   TLC % Predicted % 104   RV % Predicted % 139     Outpatient  Medications Prior to Visit  Medication Sig Dispense Refill   acetaminophen  (TYLENOL ) 500 MG tablet Take 1,500 mg by mouth daily as needed for moderate pain.     ALPRAZolam  (XANAX ) 0.5 MG tablet Take 1 tablet (0.5 mg total) by mouth 2 (two) times daily as needed for anxiety. 60 tablet 0   ELIQUIS  5 MG TABS tablet TAKE 1 TABLET(5 MG) BY MOUTH TWICE DAILY 60 tablet 11   mirtazapine  (REMERON ) 30 MG tablet TAKE 1 TABLET(30 MG) BY MOUTH AT BEDTIME 30 tablet 0   nystatin  cream (MYCOSTATIN ) Apply 1 Application topically 2 (two) times daily. 30 g 0   Omega-3 Fatty Acids (FISH OIL) 1000 MG CAPS Take 2,000 mg by mouth at bedtime.     fluticasone -salmeterol (WIXELA INHUB) 100-50 MCG/ACT AEPB Inhale 1 puff into the lungs 2 (two) times daily. 60 each 12   ALPRAZolam  (XANAX ) 0.5 MG tablet TAKE 1 TABLET(0.5 MG) BY MOUTH TWICE DAILY AS NEEDED FOR ANXIETY 60 tablet 0   famciclovir  (FAMVIR ) 500 MG tablet Take 1 tablet (500 mg total) by mouth 3 (three) times daily. 21 tablet 0   HYDROcodone  bit-homatropine (HYCODAN) 5-1.5 MG/5ML syrup Take 5 mLs by mouth every 6 (six) hours as needed for cough. 70 mL 0   predniSONE  (STERAPRED UNI-PAK 21 TAB) 10 MG (21) TBPK tablet Taper over 6 days. 21 tablet 0   propranolol  (INDERAL ) 40 MG tablet TAKE 1 TABLET(40 MG) BY MOUTH TWICE DAILY 180 tablet 0   No facility-administered medications prior to visit.

## 2023-12-31 ENCOUNTER — Encounter: Payer: Self-pay | Admitting: Internal Medicine

## 2023-12-31 ENCOUNTER — Ambulatory Visit: Attending: Internal Medicine | Admitting: Internal Medicine

## 2023-12-31 VITALS — BP 142/86 | HR 88 | Ht 67.0 in | Wt 236.2 lb

## 2023-12-31 DIAGNOSIS — R0609 Other forms of dyspnea: Secondary | ICD-10-CM

## 2023-12-31 DIAGNOSIS — I4819 Other persistent atrial fibrillation: Secondary | ICD-10-CM | POA: Diagnosis not present

## 2023-12-31 DIAGNOSIS — E785 Hyperlipidemia, unspecified: Secondary | ICD-10-CM | POA: Diagnosis not present

## 2023-12-31 DIAGNOSIS — R7303 Prediabetes: Secondary | ICD-10-CM | POA: Diagnosis not present

## 2023-12-31 DIAGNOSIS — E875 Hyperkalemia: Secondary | ICD-10-CM

## 2023-12-31 MED ORDER — POTASSIUM CHLORIDE CRYS ER 20 MEQ PO TBCR: 20.0000 meq | EXTENDED_RELEASE_TABLET | ORAL | 3 refills | Status: AC

## 2023-12-31 MED ORDER — FUROSEMIDE 40 MG PO TABS: 40.0000 mg | ORAL_TABLET | ORAL | 3 refills | Status: AC

## 2023-12-31 NOTE — Patient Instructions (Signed)
 Medication Instructions:    START LASIX / FUROSEMIDE  60 MG  (1 AND 1/2 TABLETS) TODAY AND THEN 40 MG ( 1 TABLET) EVERY OTHER DAY THEREAFTER   TAKE KCL (POTASSIUM) 20 MEQ EVERY DAY THAT YOU TAKE YOUR LASIX    *If you need a refill on your cardiac medications before your next appointment, please call your pharmacy*  Lab Work: TODAY NMR, HGBA1C, BMET, PRO BNP   IN 2 WEEKS (NO APPT NEEDED) BMET AND PRO BNP   You may go to any of these [facility] locations:   [facility] - [address] ([facility]) - [address] [phone] [address] - [address] (1st floor, next to pharmacy)   [place] - [address]    [place]  - [address]    [place] - [address] - [address] [employer]  - [address] ([facility])  [place]   - [address], [address]  If you have labs (blood work) drawn today and your tests are completely normal, you will receive your results only by: [employer] (if you have MyChart) OR A paper copy in the mail If you have any lab test that is abnormal or we need to change your treatment, we will call you to review the results.  Testing/Procedures:  Your physician has requested that you have an echocardiogram. Echocardiography is a painless test that uses sound waves to create images of your heart. It provides your doctor with information about the size and shape of your heart and how well your heart's chambers and valves are working. This procedure takes approximately one hour. There are no restrictions for this procedure. Please do NOT wear cologne, perfume, aftershave, or lotions (deodorant is allowed). Please arrive 15 minutes prior to your appointment time.  Please note: We ask at that you not bring children with you during ultrasound (echo/ vascular) testing. Due to room size and safety  concerns, children are not allowed in the ultrasound rooms during exams. Our front office staff cannot provide observation of children in our lobby area while testing is being conducted. An adult accompanying a patient to their appointment will only be allowed in the ultrasound room at the discretion of the ultrasound technician under special circumstances. We apologize for any inconvenience.   Follow-Up: AUGUST OR SEPTEMBER WITH DR OKEY   At [facility], you and your health needs are our priority.  As part of our continuing mission to provide you with exceptional heart care, our providers are all part of one team.  This team includes your primary Cardiologist (physician) and Advanced Practice Providers or APPs (Physician Assistants and Nurse Practitioners) who all work together to provide you with the care you need, when you need it.   We recommend signing up for the patient portal called MyChart.  Sign up information is provided on this After Visit Summary.  MyChart is used to connect with patients for Virtual Visits (Telemedicine).  Patients are able to view lab/test results, encounter notes, upcoming appointments, etc.  Non-urgent messages can be sent to your provider as well.   To learn more about what you can do with MyChart, go to [email or web address].   Other Instructions

## 2023-12-31 NOTE — Progress Notes (Signed)
 Cardiology Office Note   Date:  12/31/2023   ID:  [name] Brown, DOB Sep 17, 1940, MRN [id number]  PCP:  Dorina Loving, PA-C  Cardiologist:   Vina Gull, MD   Pt presents for follow up of atrial fibrillation  History of Present Illness: [name] Brown is a 83 y.o. 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  She has a  history of HTN, persistent atrial fibrillation, TIA and dypsnea.      2022  The pt presented to [facility] ER with dizziness, confusion.  Pt in afib.  Echo showed LVEF 50 to 55%  In September 2022 cardioversion x 3 attempted without success  Pt has been maintained on rate control    Amiodarone  considered but with chronic SOB never started   I saw the pt in Dec 2024      THe pt was hospitalized in June 2024 ([facility]) with pneumonia  Had 3 episodes of bradycardia  (HR 30s to 40s)  Taken off b blocker   Sent home with Zio patch   This showed afib   Average HR 84 bpm   2 episodes of VT, fastest 4 beats, longest 4 beats.      The pt says on admission to the hospital she had no LE swelling   SHe was given lasix    SHe had swelling in legs at D/C and it has persisted   She has taken 20 mg lasix  without much response She will be shaky 5 to 8 x per day    Feels heart racing often         Current Meds  Medication Sig   acetaminophen  (TYLENOL ) 500 MG tablet Take 1,500 mg by mouth daily as needed for moderate pain.   ALPRAZolam  (XANAX ) 0.5 MG tablet Take 1 tablet (0.5 mg total) by mouth 2 (two) times daily as needed for anxiety.   diltiazem (CARDIZEM CD) 120 MG 24 hr capsule Take 120 mg by mouth daily.   ELIQUIS  5 MG TABS tablet TAKE 1 TABLET(5 MG) BY MOUTH TWICE DAILY   fluticasone -salmeterol (WIXELA INHUB) 100-50 MCG/ACT AEPB Inhale 1 puff into the lungs 2 (two) times daily.   mirtazapine  (REMERON ) 30 MG tablet TAKE 1 TABLET(30 MG) BY MOUTH AT BEDTIME   Omega-3 Fatty Acids (FISH OIL) 1000 MG CAPS Take 2,000 mg by mouth at bedtime.     Allergies:   Penicillins, Cephalosporins, and  Prochlorperazine edisylate   Past Medical History:  Diagnosis Date   A-fib (HCC)    Actinic keratosis 01/15/2007   Centricity Description: ACTINIC KERATOSIS  Qualifier: Diagnosis of   By: Waylan ROSALEA Pao      Centricity Description: SOLAR KERATOSIS  Qualifier: Diagnosis of   By: Waylan ROSALEA Pao       ACUTE BRONCHITIS 06/27/2010   Qualifier: Diagnosis of   By: Waylan ROSALEA Pao       Anxiety state 03/20/2006   Qualifier: Diagnosis of   By: Waylan DO, Karen       Cardiac murmur 12/05/2022   Chronic anticoagulation 03/20/2006   Qualifier: Diagnosis of   By: Waylan ROSALEA Pao       Depression    DYSPNEA 04/23/2009   Qualifier: Diagnosis of   By: Gwenn Grimes       Essential tremor    GERD 04/23/2009   Qualifier: Diagnosis of   By: Gwenn Grimes       Hyperlipidemia    INSOMNIA, CHRONIC 07/08/2007   Qualifier: Diagnosis of   By:  Bowen DO, Darice       Major depressive disorder, recurrent episode (HCC) 03/20/2006   Qualifier: Diagnosis of   By: Waylan ROSALEA Darice       Memory loss 08/09/2017   OBESITY, NOS 03/20/2006   Qualifier: Diagnosis of   By: Waylan DO, Karen       Persistent atrial fibrillation (HCC) 01/06/2021   Secondary hypercoagulable state (HCC) 01/06/2021   Transient cerebral ischemia 07/21/2008   Qualifier: Diagnosis of   By: Waylan DO, Karen       Transient global amnesia 08/09/2017   Tremor 08/09/2017   UTI 04/28/2009   Qualifier: Diagnosis of   By: Waylan ROSALEA Darice        Past Surgical History:  Procedure Laterality Date   ABDOMINAL HYSTERECTOMY     APPENDECTOMY     breast biopsies Bilateral    CARDIOVERSION N/A 02/15/2021   Procedure: CARDIOVERSION;  Surgeon: Kate Lonni CROME, MD;  Location: [facility] ENDOSCOPY;  Service: Cardiovascular;  Laterality: N/A;   CYST EXCISION N/A 07/10/2016   Procedure: 3CM CYST EXCISION TRUNK;  Surgeon: Oneil Budge, MD;  Location: [facility]S;  Service: General;  Laterality: N/A;   FL INJ LEFT KNEE CT ARTHROGRAM ([facility] HX)      TONSILLECTOMY       Social History:  The patient  reports that she has never smoked. She has never been exposed to tobacco smoke. She has never used smokeless tobacco. She reports current alcohol use of about 1.0 standard drink of alcohol per week. She reports that she does not use drugs.   Family History:  The patient's family history includes Atrial fibrillation in her father, mother, and sister; COPD in her mother; Lung cancer in her father.    ROS:  Please see the history of present illness. All other systems are reviewed and  Negative to the above problem except as noted.    PHYSICAL EXAM: VS:  BP (!) 142/86   Pulse 88   Ht 5' 7 (1.702 m)   Wt 236 lb 3.2 oz (107.1 kg)   SpO2 95%   BMI 36.99 kg/m    Orthostatics BP laying 142/84  P 91  SItting 156/86  P 96  Standing 2 min 160/96  P 111  Standing 4 min 170/102  P 107  GEN:  Obese 83 yo in no acute distress  HEENT: normal  Neck: no JVD, no carotid bruits Cardiac:   Irreg irreg  No S3  No murmurs  No LE  edema  Respiratory:  clear to auscultation bilaterally  Moving air   No wheezes  GI: soft, nontender   No masses   No hepatomegaly    EKG:  EKG is not  ordered today.  Lexiscan  PET/CT    The study is normal. The study is low risk. .   Calculated LVEF is mildly reduced, but the calculation is less reliable due to atrial fibrillation. Correlate with echo   LV perfusion is normal.   Rest left ventricular function is abnormal. Rest global function is mildly reduced. There were no regional wall motion abnormalities. Rest EF: 46%. Stress left ventricular function is abnormal. Stress global function is mildly reduced. There were no regional wall abnormalities. Stress EF: 47%. End diastolic cavity size is normal.   Myocardial blood flow was computed to be 0.14ml/g/min at rest and 2.16ml/g/min at stress. Global myocardial blood flow reserve was 2.54 and was normal.   Coronary calcium  was absent on the attenuation correction CT  images.  Electronically signed by Jerel Balding, MD. Lipid Panel    Component Value Date/Time   CHOL 217 (H) 02/15/2022 0832   TRIG 309.0 (H) 02/15/2022 0832   HDL 36.90 (L) 02/15/2022 0832   CHOLHDL 6 02/15/2022 0832   VLDL 61.8 (H) 02/15/2022 0832   LDLCALC 152 (H) 05/18/2010 2005   LDLDIRECT 134.0 02/15/2022 0832      Wt Readings from Last 3 Encounters:  12/31/23 236 lb 3.2 oz (107.1 kg)  12/27/23 233 lb (105.7 kg)  12/18/23 232 lb 9.6 oz (105.5 kg)      ASSESSMENT AND PLAN:  1  Atrial fibrillation   Pt notes continued heart racing   She has mild bump in HR with standing     I would start back on low dose inderal  (stopped for bradycardia in hospital; had been on prior for tremors and afib)  Continue anticoagulation    FOllow up Zio patch for control  2   LE edema   PT with signficant edema   Will check BMET and BNP   Start 60 mg lasix  with 20 KCL now   Then 40 mg every other day with 20 KCL    Follow up labs in 2 wks  Echo to reevaluate LV function   3  Dyspnea   Pt has long hx of DOE   Lexiscan  PET CT showed no ischemia   Currently she says breathing is OK Follow as diurese Continue to follow up in pulmonary    4 HTN  BP is high   Add back propranolol   FOllow at home and at next visit   5   Lipids  Will repeat lipids    6  Metabolics   WIll get A1C       Current medicines are reviewed at length with the patient today.  The patient does not have concerns regarding medicines.  Signed, Vina Gull, MD  12/31/2023 11:12 AM    [facility] [address] [place], [place], [place]  [address] Phone: [phone]; Fax: [phone]

## 2024-01-01 ENCOUNTER — Ambulatory Visit: Payer: Self-pay | Admitting: *Deleted

## 2024-01-01 LAB — NMR, LIPOPROFILE
Cholesterol, Total: 222 mg/dL — ABNORMAL HIGH (ref 100–199)
HDL Particle Number: 32.7 umol/L (ref >=30.5)
HDL-C: 47 mg/dL (ref >39)
LDL Particle Number: 1744 nmol/L — ABNORMAL HIGH (ref <1000)
LDL Size: 20.4 nm — ABNORMAL LOW (ref >20.5)
LDL-C (NIH Calc): 140 mg/dL — ABNORMAL HIGH (ref 0–99)
LP-IR Score: 69 — ABNORMAL HIGH (ref <=45)
Small LDL Particle Number: 1043 nmol/L — ABNORMAL HIGH (ref <=527)
Triglycerides: 192 mg/dL — ABNORMAL HIGH (ref 0–149)

## 2024-01-01 LAB — BASIC METABOLIC PANEL WITH GFR
BUN/Creatinine Ratio: 15 (ref 12–28)
BUN: 13 mg/dL (ref 8–27)
CO2: 21 mmol/L (ref 20–29)
Calcium: 9.1 mg/dL (ref 8.7–10.3)
Chloride: 107 mmol/L — ABNORMAL HIGH (ref 96–106)
Creatinine, Ser: 0.87 mg/dL (ref 0.57–1.00)
Glucose: 115 mg/dL — ABNORMAL HIGH (ref 70–99)
Potassium: 5.2 mmol/L (ref 3.5–5.2)
Sodium: 142 mmol/L (ref 134–144)
eGFR: 67 mL/min/1.73 (ref >59)

## 2024-01-01 LAB — HEMOGLOBIN A1C
Est. average glucose Bld gHb Est-mCnc: 123 mg/dL
Hgb A1c MFr Bld: 5.9 % — ABNORMAL HIGH (ref 4.8–5.6)

## 2024-01-01 LAB — PRO B NATRIURETIC PEPTIDE: NT-Pro BNP: 530 pg/mL (ref 0–738)

## 2024-01-03 DIAGNOSIS — D0359 Melanoma in situ of other part of trunk: Secondary | ICD-10-CM | POA: Diagnosis not present

## 2024-01-07 ENCOUNTER — Encounter: Payer: Self-pay | Admitting: Internal Medicine

## 2024-01-07 ENCOUNTER — Other Ambulatory Visit: Payer: Self-pay | Admitting: Medical

## 2024-01-08 ENCOUNTER — Ambulatory Visit: Admitting: Nurse Practitioner

## 2024-01-09 ENCOUNTER — Other Ambulatory Visit: Payer: Self-pay | Admitting: Medical

## 2024-01-10 ENCOUNTER — Telehealth: Payer: Self-pay | Admitting: Medical

## 2024-01-10 NOTE — Telephone Encounter (Signed)
 Copied from CRM #[id number]. Topic: Medicare AWV >> Jan 10, 2024 10:36 AM Nathanel DEL wrote: Reason for CRM: Called LVM 01/10/2024 to schedule AWV. Please schedule Virtual or Telehealth visits ONLY.   Nathanel Paschal; Care Guide Ambulatory Clinical Support [facility] Direct Dial: [phone]

## 2024-01-11 MED ORDER — PROPRANOLOL HCL 40 MG PO TABS: 40.0000 mg | ORAL_TABLET | Freq: Two times a day (BID) | ORAL | 3 refills | Status: AC

## 2024-01-11 NOTE — Telephone Encounter (Signed)
Rx refill sent to pt pharmacy 

## 2024-01-11 NOTE — Telephone Encounter (Signed)
 OK to fill propranolol  40 bid

## 2024-01-14 ENCOUNTER — Encounter: Payer: Self-pay | Admitting: Internal Medicine

## 2024-01-14 MED ORDER — DILTIAZEM HCL ER COATED BEADS 120 MG PO CP24: 120.0000 mg | ORAL_CAPSULE | Freq: Every day | ORAL | 3 refills | Status: AC

## 2024-01-17 DIAGNOSIS — H2512 Age-related nuclear cataract, left eye: Secondary | ICD-10-CM | POA: Diagnosis not present

## 2024-01-17 DIAGNOSIS — H2513 Age-related nuclear cataract, bilateral: Secondary | ICD-10-CM | POA: Diagnosis not present

## 2024-01-17 DIAGNOSIS — H25043 Posterior subcapsular polar age-related cataract, bilateral: Secondary | ICD-10-CM | POA: Diagnosis not present

## 2024-01-17 DIAGNOSIS — I4819 Other persistent atrial fibrillation: Secondary | ICD-10-CM | POA: Diagnosis not present

## 2024-01-17 DIAGNOSIS — H25013 Cortical age-related cataract, bilateral: Secondary | ICD-10-CM | POA: Diagnosis not present

## 2024-01-17 DIAGNOSIS — E875 Hyperkalemia: Secondary | ICD-10-CM | POA: Diagnosis not present

## 2024-01-17 DIAGNOSIS — R0609 Other forms of dyspnea: Secondary | ICD-10-CM | POA: Diagnosis not present

## 2024-01-18 ENCOUNTER — Telehealth: Payer: Self-pay

## 2024-01-18 LAB — PRO B NATRIURETIC PEPTIDE: NT-Pro BNP: 419 pg/mL (ref 0–738)

## 2024-01-18 LAB — BASIC METABOLIC PANEL WITH GFR
BUN/Creatinine Ratio: 20 (ref 12–28)
BUN: 23 mg/dL (ref 8–27)
CO2: 23 mmol/L (ref 20–29)
Calcium: 9.1 mg/dL (ref 8.7–10.3)
Chloride: 105 mmol/L (ref 96–106)
Creatinine, Ser: 1.14 mg/dL — ABNORMAL HIGH (ref 0.57–1.00)
Glucose: 111 mg/dL — ABNORMAL HIGH (ref 70–99)
Potassium: 4.7 mmol/L (ref 3.5–5.2)
Sodium: 145 mmol/L — ABNORMAL HIGH (ref 134–144)
eGFR: 48 mL/min/1.73 — ABNORMAL LOW (ref >59)

## 2024-01-18 NOTE — Telephone Encounter (Signed)
   Pre-operative Risk Assessment    Patient Name: [name] Brown  DOB: 07-27-1940 MRN: [id number]   Date of last office visit: 12/31/23 VINA GULL, MD Date of next office visit: 02/07/24 VINA GULL, MD   Request for Surgical Clearance    Procedure:  CATARACT EXTRACTION W/ INTRAOCULAR LENS IMPLANTATION OF THE LEFT EYE FOLLOWED BY THE RIGHT EYE  Date of Surgery:  Clearance TBD                                Surgeon:  DR EVALENE RAW Surgeon's Group or Practice Name:  [facility] Phone number:  [phone] Fax number:  [phone]   Type of Clearance Requested:   - Medical  - Pharmacy:  Hold Apixaban  (Eliquis )     Type of Anesthesia:  TOPICAL ANESTHESIA W/ IV MEDICATION   Additional requests/questions:    SignedLucie DELENA Ku   01/18/2024, 5:44 PM

## 2024-01-20 ENCOUNTER — Encounter: Payer: Self-pay | Admitting: Medical

## 2024-01-21 ENCOUNTER — Other Ambulatory Visit: Payer: Self-pay

## 2024-01-21 MED ORDER — MIRTAZAPINE 30 MG PO TABS: 30.0000 mg | ORAL_TABLET | Freq: Every day | ORAL | 1 refills | Status: DC

## 2024-01-23 NOTE — Telephone Encounter (Signed)
   Name: [name] Brown  DOB: 09-Oct-1940  MRN: [id number]  Primary Cardiologist: None  Chart reviewed as part of pre-operative protocol coverage. The patient has an upcoming visit scheduled with Dr. Okey on 02/04/2024 at which time clearance can be addressed in case there are any issues that would impact surgical recommendations.   CATARACT EXTRACTION W/ INTRAOCULAR LENS IMPLANTATION OF THE LEFT EYE FOLLOWED BY THE RIGHT EYE Is not scheduled until TBD as below. I added preop FYI to appointment note so that provider is aware to address at time of outpatient visit.  Per office protocol the cardiology provider should forward their finalized clearance decision and recommendations regarding antiplatelet therapy to the requesting party below.    This message will also be routed to  Dr Okey for input on holding Eliquis  as requested below so that this information is available to the clearing provider at time of patient's appointment.   I will route this message as FYI to requesting party and remove this message from the preop box as separate preop APP input not needed at this time.   Please call with any questions.  Lamarr Satterfield, NP  01/23/2024, 9:26 AM

## 2024-01-24 DIAGNOSIS — I4819 Other persistent atrial fibrillation: Secondary | ICD-10-CM | POA: Diagnosis not present

## 2024-01-28 ENCOUNTER — Ambulatory Visit (HOSPITAL_COMMUNITY)
Admission: RE | Admit: 2024-01-28 | Discharge: 2024-01-28 | Disposition: A | Discharge status: Home / Self Care | Source: Ambulatory Visit | Attending: Internal Medicine | Admitting: Internal Medicine

## 2024-01-28 DIAGNOSIS — I4819 Other persistent atrial fibrillation: Secondary | ICD-10-CM | POA: Insufficient documentation

## 2024-01-28 DIAGNOSIS — E875 Hyperkalemia: Secondary | ICD-10-CM | POA: Insufficient documentation

## 2024-01-28 DIAGNOSIS — R0609 Other forms of dyspnea: Secondary | ICD-10-CM | POA: Diagnosis not present

## 2024-01-28 LAB — ECHOCARDIOGRAM COMPLETE
AR max vel: 2.42 cm2
AV Area VTI: 2.49 cm2
AV Area mean vel: 2.4 cm2
AV Mean grad: 2 mmHg
AV Peak grad: 3.5 mmHg
AV Vena cont: 0.27 cm
Ao pk vel: 0.94 m/s
Area-P 1/2: 3.03 cm2
P 1/2 time: 947 ms
S' Lateral: 3.31 cm

## 2024-01-31 ENCOUNTER — Ambulatory Visit: Payer: Self-pay | Admitting: Internal Medicine

## 2024-01-31 DIAGNOSIS — I4819 Other persistent atrial fibrillation: Secondary | ICD-10-CM | POA: Diagnosis not present

## 2024-01-31 NOTE — Telephone Encounter (Signed)
 Requesting office sent duplicate request. Procedure is planned for 02/13/24 and 02/27/24. Clearance request is in process already from our team. Pt has appt 02/04/24 with Dr. Okey.   Once the pt has been cleared Dr. Okey will have her nurse fax the clearance notes.

## 2024-02-06 NOTE — Progress Notes (Unsigned)
 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  She has a  history of HTN, persistent atrial fibrillation, TIA and dypsnea.      2022  The pt presented to [facility] ER with dizziness, confusion.  Pt in afib.  Echo showed LVEF 50 to 55%  In September 2022 cardioversion x 3 attempted without success  Pt has been maintained on rate control    Amiodarone  considered but with chronic SOB never started     June 2024  Pt hospitalized at [facility] with pneumonia  Had 3 episodes of bradycardia  (HR 30s to 40s)  Taken off b blocker   Sent home with Zio patch   This showed afib   Average HR 84 bpm   2 episodes of VT, fastest 4 beats, longest 4 beats.          I saw the pt in JUly 2025  Echo ordered   LVEF and RVEF normal  Atria noted to be mild/moderately dilated   Monitor showed 100% afib   HR 37 to 157  average 70 bpm  Since seen she says she still gets SOB   Note she has been seen in pulmonary   PFTs done   Felt possibly have mild exercise induced asthma    She says inhaler does not help  Her LE edema was better with lasix    But, she has been missing doses as son is in hospital     Current Meds  Medication Sig   acetaminophen  (TYLENOL ) 500 MG tablet Take 1,500 mg by mouth daily as needed for moderate pain.   ALPRAZolam  (XANAX ) 0.5 MG tablet TAKE 1 TABLET(0.5 MG) BY MOUTH TWICE DAILY AS NEEDED FOR ANXIETY   diltiazem  (CARDIZEM  CD) 120 MG 24 hr capsule Take 1 capsule (120 mg total) by mouth daily.   ELIQUIS  5 MG TABS tablet TAKE 1 TABLET(5 MG) BY MOUTH TWICE DAILY   fluticasone -salmeterol (WIXELA INHUB) 100-50 MCG/ACT AEPB Inhale 1 puff into the lungs 2 (two) times daily.   furosemide  (LASIX ) 40 MG tablet Take 1 tablet (40 mg total) by mouth  every other day.   mirtazapine  (REMERON ) 30 MG tablet Take 1 tablet (30 mg total) by mouth at bedtime.   Omega-3 Fatty Acids (FISH OIL) 1000 MG CAPS Take 2,000 mg by mouth at bedtime.   potassium chloride  SA (KLOR-CON  M) 20 MEQ tablet Take 1 tablet (20 mEq total) by mouth every other day.   propranolol  (INDERAL ) 40 MG tablet Take 1 tablet (40 mg total) by mouth 2 (two) times daily.     Allergies:   Penicillins, Cephalosporins, Estrogens, and Prochlorperazine edisylate   Past Medical History:  Diagnosis Date   A-fib (HCC)    Actinic keratosis 01/15/2007   Centricity Description: ACTINIC KERATOSIS  Qualifier: Diagnosis of   By: Waylan ROSALEA Pao      Centricity Description: SOLAR KERATOSIS  Qualifier: Diagnosis of   By: Waylan ROSALEA Pao       ACUTE BRONCHITIS 06/27/2010   Qualifier: Diagnosis of   By: Waylan ROSALEA Pao       Anxiety state 03/20/2006   Qualifier: Diagnosis of   By: Waylan ROSALEA Pao  Cardiac murmur 12/05/2022   Chronic anticoagulation 03/20/2006   Qualifier: Diagnosis of   By: Waylan ROSALEA Pao       Depression    DYSPNEA 04/23/2009   Qualifier: Diagnosis of   By: Gwenn Grimes       Essential tremor    GERD 04/23/2009   Qualifier: Diagnosis of   By: Gwenn Grimes       Hyperlipidemia    INSOMNIA, CHRONIC 07/08/2007   Qualifier: Diagnosis of   By: Waylan DO, Karen       Major depressive disorder, recurrent episode (HCC) 03/20/2006   Qualifier: Diagnosis of   By: Waylan ROSALEA Pao       Memory loss 08/09/2017   OBESITY, NOS 03/20/2006   Qualifier: Diagnosis of   By: Waylan DO, Karen       Persistent atrial fibrillation (HCC) 01/06/2021   Secondary hypercoagulable state (HCC) 01/06/2021   Transient cerebral ischemia 07/21/2008   Qualifier: Diagnosis of   By: Waylan DO, Karen       Transient global amnesia 08/09/2017   Tremor 08/09/2017   UTI 04/28/2009   Qualifier: Diagnosis of   By: Waylan ROSALEA Pao        Past Surgical History:  Procedure Laterality Date    ABDOMINAL HYSTERECTOMY     APPENDECTOMY     breast biopsies Bilateral    CARDIOVERSION N/A 02/15/2021   Procedure: CARDIOVERSION;  Surgeon: Kate Lonni CROME, MD;  Location: [facility] ENDOSCOPY;  Service: Cardiovascular;  Laterality: N/A;   CYST EXCISION N/A 07/10/2016   Procedure: 3CM CYST EXCISION TRUNK;  Surgeon: Oneil Budge, MD;  Location: [facility];  Service: General;  Laterality: N/A;   FL INJ LEFT KNEE CT ARTHROGRAM ([facility] HX)     TONSILLECTOMY       Social History:  The patient  reports that she has never smoked. She has never been exposed to tobacco smoke. She has never used smokeless tobacco. She reports current alcohol use of about 1.0 standard drink of alcohol per week. She reports that she does not use drugs.   Family History:  The patient's family history includes Atrial fibrillation in her father, mother, and sister; COPD in her mother; Lung cancer in her father.    ROS:  Please see the history of present illness. All other systems are reviewed and  Negative to the above problem except as noted.    PHYSICAL EXAM: VS:  BP 128/76   Pulse 72   Ht 5' 6 (1.676 m)   Wt 238 lb 4.8 oz (108.1 kg)   SpO2 93%   BMI 38.46 kg/m     GEN:  Obese 83 yo in NAD  HEENT: normal  Neck: no JVD,  Cardiac:   Irreg irreg  No S3  No murmurs  Respiratory:  clear to auscultation bilaterally GI: soft, nontender   No masses Ext  1-2+ L greater than R    EKG:  EKG shows atrial fibrillation   72 bpm    Monitor  Aug 2025  Atrial Fibrillation occurred continuously (100% burden), ranging from 37-157 bpm (avg of 70 bpm). 1 Pause occurred lasting 3 secs (20 bpm) occurred at 8:30 AM Rare PVCs    Echo  Aug 2025  1. Left ventricular ejection fraction, by estimation, is 55 to 60%. The  left ventricle has normal function. Left ventricular diastolic parameters  are indeterminate.   2. Right ventricular systolic function is low normal. The right  ventricular size is mildly enlarged.  3. Left  atrial size was mildly dilated.   4. Right atrial size was mild to moderately dilated.   5. The mitral valve is normal in structure. Mild mitral valve  regurgitation.   6. The aortic valve is tricuspid. Aortic valve regurgitation is mild.   7. The inferior vena cava is normal in size with greater than 50%  respiratory variability, suggesting right atrial pressure of 3 mmHg.   Comparison(s): The left ventricular function is unchanged.    Lexiscan  PET/CT  Jan 2025    The study is normal. The study is low risk. .   Calculated LVEF is mildly reduced, but the calculation is less reliable due to atrial fibrillation. Correlate with echo   LV perfusion is normal.   Rest left ventricular function is abnormal. Rest global function is mildly reduced. There were no regional wall motion abnormalities. Rest EF: 46%. Stress left ventricular function is abnormal. Stress global function is mildly reduced. There were no regional wall abnormalities. Stress EF: 47%. End diastolic cavity size is normal.   Myocardial blood flow was computed to be 0.74ml/g/min at rest and 2.16ml/g/min at stress. Global myocardial blood flow reserve was 2.54 and was normal.   Coronary calcium  was absent on the attenuation correction CT images.   Electronically signed by Jerel Balding, MD. Lipid Panel    Component Value Date/Time   CHOL 217 (H) 02/15/2022 0832   TRIG 309.0 (H) 02/15/2022 0832   HDL 36.90 (L) 02/15/2022 0832   CHOLHDL 6 02/15/2022 0832   VLDL 61.8 (H) 02/15/2022 0832   LDLCALC 152 (H) 05/18/2010 2005   LDLDIRECT 134.0 02/15/2022 0832      Wt Readings from Last 3 Encounters:  02/07/24 238 lb 4.8 oz (108.1 kg)  12/31/23 236 lb 3.2 oz (107.1 kg)  12/27/23 233 lb (105.7 kg)      ASSESSMENT AND PLAN:  1  Atrial fibrillation  Remains rate controlled   Continue on current regimen  2   LE edema  Echo shows LVEF normal   She said she was doing better with edema but has missed lasix    Encouraged her to get  back on routine and also to elevate/move legs   Does not want to try Jardiance due to hx of UTIs    3  Dyspnea  with exertion.   Lexiscan  PET/ CT normal   Pt had PFTs   ? Mild asthma   Follow      4 HTN BP is controlled     5   Lipids  LDL 149  HDL 47  Trig 192   Reviewed diet   6  Metabolics  A1C 5.9   Limit carbs      Current medicines are reviewed at length with the patient today.  The patient does not have concerns regarding medicines.  Signed, Vina Gull, MD  02/07/2024 2:27 PM    [facility] [address] [place], [place], [place]  [address] Phone: [phone]; Fax: [phone]

## 2024-02-07 ENCOUNTER — Ambulatory Visit: Attending: Internal Medicine | Admitting: Internal Medicine

## 2024-02-07 ENCOUNTER — Encounter: Payer: Self-pay | Admitting: Internal Medicine

## 2024-02-07 VITALS — BP 128/76 | HR 72 | Ht 66.0 in | Wt 238.3 lb

## 2024-02-07 DIAGNOSIS — I4819 Other persistent atrial fibrillation: Secondary | ICD-10-CM

## 2024-02-07 NOTE — Patient Instructions (Signed)
 Medication Instructions:  Your physician recommends that you continue on your current medications as directed. Please refer to the Current Medication list given to you today.  *If you need a refill on your cardiac medications before your next appointment, please call your pharmacy*  Follow-Up: At [facility], you and your health needs are our priority.  As part of our continuing mission to provide you with exceptional heart care, our providers are all part of one team.  This team includes your primary Cardiologist (physician) and Advanced Practice Providers or APPs (Physician Assistants and Nurse Practitioners) who all work together to provide you with the care you need, when you need it.  Your next appointment:   6-7 month(s)  Provider:   Vina Gull, MD   We recommend signing up for the patient portal called MyChart.  Sign up information is provided on this After Visit Summary.  MyChart is used to connect with patients for Virtual Visits (Telemedicine).  Patients are able to view lab/test results, encounter notes, upcoming appointments, etc.  Non-urgent messages can be sent to your provider as well.    To learn more about what you can do with MyChart, go to [email or web address].

## 2024-02-13 DIAGNOSIS — H2512 Age-related nuclear cataract, left eye: Secondary | ICD-10-CM | POA: Diagnosis not present

## 2024-02-14 DIAGNOSIS — H2511 Age-related nuclear cataract, right eye: Secondary | ICD-10-CM | POA: Diagnosis not present

## 2024-02-27 DIAGNOSIS — H2511 Age-related nuclear cataract, right eye: Secondary | ICD-10-CM | POA: Diagnosis not present

## 2024-03-23 ENCOUNTER — Other Ambulatory Visit: Payer: Self-pay | Admitting: Medical

## 2024-03-27 DIAGNOSIS — Z87440 Personal history of urinary (tract) infections: Secondary | ICD-10-CM | POA: Diagnosis not present

## 2024-03-30 ENCOUNTER — Other Ambulatory Visit: Payer: Self-pay | Admitting: Medical

## 2024-04-01 NOTE — Telephone Encounter (Signed)
 Rx xanax refilled

## 2024-04-22 ENCOUNTER — Other Ambulatory Visit: Payer: Self-pay | Admitting: Medical

## 2024-04-28 ENCOUNTER — Ambulatory Visit (HOSPITAL_BASED_OUTPATIENT_CLINIC_OR_DEPARTMENT_OTHER)
Admission: RE | Admit: 2024-04-28 | Discharge: 2024-04-28 | Disposition: A | Discharge status: Home / Self Care | Source: Ambulatory Visit | Attending: Internal Medicine | Admitting: Internal Medicine

## 2024-04-28 DIAGNOSIS — R911 Solitary pulmonary nodule: Secondary | ICD-10-CM | POA: Insufficient documentation

## 2024-05-12 ENCOUNTER — Encounter: Payer: Self-pay | Admitting: Student in an Organized Health Care Education/Training Program

## 2024-05-12 ENCOUNTER — Ambulatory Visit: Admitting: Student in an Organized Health Care Education/Training Program

## 2024-05-12 VITALS — BP 116/66 | HR 104 | Temp 97.6°F | Ht 66.0 in | Wt 233.0 lb

## 2024-05-12 DIAGNOSIS — R0602 Shortness of breath: Secondary | ICD-10-CM | POA: Diagnosis not present

## 2024-05-12 DIAGNOSIS — R911 Solitary pulmonary nodule: Secondary | ICD-10-CM | POA: Diagnosis not present

## 2024-05-12 MED ORDER — FLUTICASONE-SALMETEROL 100-50 MCG/ACT IN AEPB: 1.0000 | INHALATION_SPRAY | Freq: Two times a day (BID) | RESPIRATORY_TRACT | 12 refills | Status: AC

## 2024-05-12 NOTE — Progress Notes (Signed)
 Assessment & Plan:   Assessment & Plan  #Pulmonary nodule  Pulmonary nodules stable on recent CT scan. Previous pneumonia resolved. Current CT reassuring. Previous imaging also notable for a RML pulmonary nodule that was more akin to an infiltrate or scar. Finding from previous CT at [facility] showed resolution of the LLL infiltrate. This is overall re-assuring and we will repeat the CT scan in 1 year to ensure stability.  - Ordered repeat CT scan in one year - CT CHEST WO CONTRAST; Future - If next year's scan shows no changes, no further imaging needed.  #Chronic dyspnea  Dyspnea persists despite Wixela. Cardiac workup unremarkable and her afib is under control. PFT's showed normal TLC and DLCO, but had mild obstruction on spirometry prompting initiation of ICS/LABA. Will continue Wixela for now, and reocmmended graded exercise program as well as weight loss.  - Continue Wixela, wash mouth after use. - Encouraged increased physical activity and weight loss, especially abdominal. - Consider reducing Wixela to one puff daily, monitor symptoms. If worsens, revert to one puff twice daily. - fluticasone -salmeterol (WIXELA INHUB) 100-50 MCG/ACT AEPB; Inhale 1 puff into the lungs 2 (two) times daily.  Dispense: 60 each; Refill: 12   Return in about 1 year (around 05/12/2025).  Belva November, MD  Pulmonary Critical Care  I spent 32 minutes caring for this patient today, including preparing to see the patient, obtaining a medical history , reviewing a separately obtained history, performing a medically appropriate examination and/or evaluation, counseling and educating the patient/family/caregiver, ordering medications, tests, or procedures, documenting clinical information in the electronic health record, and independently interpreting results (not separately reported/billed) and communicating results to the patient/family/caregiver  End of visit medications:  Meds ordered this  encounter  Medications   fluticasone -salmeterol (WIXELA INHUB) 100-50 MCG/ACT AEPB    Sig: Inhale 1 puff into the lungs 2 (two) times daily.    Dispense:  60 each    Refill:  12     Current Outpatient Medications:    acetaminophen  (TYLENOL ) 500 MG tablet, Take 1,500 mg by mouth daily as needed for moderate pain., Disp: , Rfl:    ALPRAZolam  (XANAX ) 0.5 MG tablet, TAKE 1 TABLET(0.5 MG) BY MOUTH TWICE DAILY AS NEEDED FOR ANXIETY, Disp: 60 tablet, Rfl: 1   diltiazem  (CARDIZEM  CD) 120 MG 24 hr capsule, Take 1 capsule (120 mg total) by mouth daily., Disp: 90 capsule, Rfl: 3   ELIQUIS  5 MG TABS tablet, TAKE 1 TABLET(5 MG) BY MOUTH TWICE DAILY, Disp: 60 tablet, Rfl: 11   melatonin 5 MG TABS, Take 5 mg by mouth at bedtime., Disp: , Rfl:    mirtazapine  (REMERON ) 30 MG tablet, TAKE 1 TABLET(30 MG) BY MOUTH AT BEDTIME, Disp: 30 tablet, Rfl: 1   Omega-3 Fatty Acids (FISH OIL) 1000 MG CAPS, Take 2,000 mg by mouth at bedtime., Disp: , Rfl:    propranolol  (INDERAL ) 40 MG tablet, Take 1 tablet (40 mg total) by mouth 2 (two) times daily., Disp: 180 tablet, Rfl: 3   fluticasone -salmeterol (WIXELA INHUB) 100-50 MCG/ACT AEPB, Inhale 1 puff into the lungs 2 (two) times daily., Disp: 60 each, Rfl: 12   furosemide  (LASIX ) 40 MG tablet, Take 1 tablet (40 mg total) by mouth every other day. (Patient not taking: Reported on 05/12/2024), Disp: 90 tablet, Rfl: 3   potassium chloride  SA (KLOR-CON  M) 20 MEQ tablet, Take 1 tablet (20 mEq total) by mouth every other day. (Patient not taking: Reported on 05/12/2024), Disp: 90 tablet,  Rfl: 3   Subjective:   PATIENT ID: [name] Brown GENDER: female DOB: May 18, 1941, MRN: [id number]  Chief Complaint  Patient presents with   Lung Mass    HPI  Discussed the use of AI scribe software for clinical note transcription with the patient, who gave verbal consent to proceed.  History of Present Illness  [name] Brown is an 83 year old female with pulmonary nodules who  presents with shortness of breath.  She has a history of atrial fibrillation and has been followed closely with cardiology where she also underwent an ischemic workup.  She has had an echocardiogram which was unremarkable as well as a cardiac PET CT. the cardiac CT was notable for a pulmonary nodule in the right middle lobe for which she saw her primary care physician and underwent a chest CT again showing small infiltrate in the right middle lobe.   She felt that she might of had a cold around the time she had her imaging studies.  She did not experience any sore throat, myalgias, arthralgias, fevers, or chills at that time but did feel that her cough and postnasal drip were a little more prominent.  She reports a history of being admitted to the ICU in 2007 where she was told she had pancake lung but was not intubated at that time.  She was previously followed by Dr. Neysa in our clinic but this was many years ago.   Return Visit 10/17/2023: She is presenting for follow up today after PFT's and having had a repeat chest CT. She continues to experience shortness of breath mostly with exertion. She is anxious about the result of the chest CT. There are no new symptoms reported. She does not recall if the albuterol  during the PFT made her feel better. She reports an occasional cough that is very sporadic and haphazard.  She does not report any sputum production or hemoptysis.    Return Visit 12/27/2023: She was recently admitted to [facility] where she was treated for pneumonia with antibiotics. The CT scan while hospitalized showed a left lower lobe infiltrate. She was asked to follow up with pulmonary after discharge. She feels well and her symptoms are improved. Her cough and sputum production are significantly improved. She is compliant with her inhalers. She feels her heart rate control is contributing to some of her symptoms at the time of this visit.   CT Chest 12/02/2023 (Care Everywhere):  Lungs/pleura: Focal region of airspace consolidation in the medial segment of the left lower lobe measuring up to 18 mm. Associated lower lobe predominant bronchial wall thickening with scattered areas of mucoid impaction. Scattered regions of groundglass opacification in the lung bases. Calcified lingular granuloma.   Return Visit 05/12/2024:  She experiences persistent shortness of breath despite using Wixela twice daily. There has been no change in her breathing since the last visit. She has attempted to use the medication before activities such as football games and walking, but has not noticed any improvement in her symptoms. She has not stopped the medication to assess any changes in her symptoms.  Her weight has remained stable over the past six months with minor fluctuations. She experiences a cough approximately every two months, which she attributes to a cold, and finds Mucinex helpful for these episodes. No wheezing or significant phlegm production is noted.  She has not required prednisone  or antibiotics recently and has not been hospitalized since her last visit for atrial fibrillation. Her atrial fibrillation is currently  managed with Cardizem .  She engages in physical activity three days a week, including walking to football and soccer games. However, she finds activities like carrying food from her car exhausting. She lives alone in [place] and remains active, including caring for a four-year-old five days a week.   Patient denies any personal history of smoking but does report significant secondhand smoke exposure (parents, late husband).  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   Ancillary information including prior medications, full medical/surgical/family/social histories, and PFTs (when available) are listed below and have been reviewed.    Review of Systems  Constitutional:  Negative for chills, fever and weight  loss.  Respiratory:  Positive for shortness of breath (with exertion). Negative for cough, hemoptysis, sputum production and wheezing.   Cardiovascular:  Negative for chest pain.     Objective:   Vitals:   05/12/24 1547  BP: 116/66  Pulse: (!) 104  Temp: 97.6 F (36.4 C)  TempSrc: Temporal  SpO2: 96%  Weight: 233 lb (105.7 kg)  Height: 5' 6 (1.676 m)   96% on RA BMI Readings from Last 3 Encounters:  05/12/24 37.61 kg/m  02/07/24 38.46 kg/m  12/31/23 36.99 kg/m   Wt Readings from Last 3 Encounters:  05/12/24 233 lb (105.7 kg)  02/07/24 238 lb 4.8 oz (108.1 kg)  12/31/23 236 lb 3.2 oz (107.1 kg)    .vitalsmbmi  Physical Exam Constitutional:      Appearance: Normal appearance. She is obese.  Cardiovascular:     Rate and Rhythm: Normal rate and regular rhythm.     Pulses: Normal pulses.     Heart sounds: Normal heart sounds.  Pulmonary:     Effort: Pulmonary effort is normal. No respiratory distress.     Breath sounds: Normal breath sounds. No wheezing or rales.  Neurological:     General: No focal deficit present.     Mental Status: She is alert and oriented to person, place, and time. Mental status is at baseline.     Ancillary Information    Past Medical History:  Diagnosis Date   A-fib [facility])    Actinic keratosis 01/15/2007   Centricity Description: ACTINIC KERATOSIS  Qualifier: Diagnosis of   By: Waylan ROSALEA Pao      Centricity Description: SOLAR KERATOSIS  Qualifier: Diagnosis of   By: Waylan ROSALEA Pao       ACUTE BRONCHITIS 06/27/2010   Qualifier: Diagnosis of   By: Waylan ROSALEA Pao       Anxiety state 03/20/2006   Qualifier: Diagnosis of   By: Waylan DO, Karen       Cardiac murmur 12/05/2022   Chronic anticoagulation 03/20/2006   Qualifier: Diagnosis of   By: Waylan ROSALEA Pao       Depression    DYSPNEA 04/23/2009   Qualifier: Diagnosis of   By: Gwenn Grimes       Essential tremor    GERD 04/23/2009   Qualifier: Diagnosis of   By: Gwenn Grimes       Hyperlipidemia    INSOMNIA, CHRONIC 07/08/2007   Qualifier: Diagnosis of   By: Waylan DO, Karen       Major depressive disorder, recurrent episode 03/20/2006   Qualifier: Diagnosis of   By: Waylan ROSALEA Pao       Memory loss 08/09/2017   OBESITY, NOS 03/20/2006   Qualifier: Diagnosis of   By: Waylan ROSALEA Pao  Persistent atrial fibrillation (HCC) 01/06/2021   Secondary hypercoagulable state 01/06/2021   Transient cerebral ischemia 07/21/2008   Qualifier: Diagnosis of   By: Waylan DO, Karen       Transient global amnesia 08/09/2017   Tremor 08/09/2017   UTI 04/28/2009   Qualifier: Diagnosis of   By: Waylan ROSALEA Pao         Family History  Problem Relation Age of Onset   Atrial fibrillation Mother    COPD Mother    Atrial fibrillation Father    Lung cancer Father    Atrial fibrillation Sister      Past Surgical History:  Procedure Laterality Date   ABDOMINAL HYSTERECTOMY     APPENDECTOMY     breast biopsies Bilateral    CARDIOVERSION N/A 02/15/2021   Procedure: CARDIOVERSION;  Surgeon: Kate Lonni CROME, MD;  Location: [facility] ENDOSCOPY;  Service: Cardiovascular;  Laterality: N/A;   CYST EXCISION N/A 07/10/2016   Procedure: 3CM CYST EXCISION TRUNK;  Surgeon: Oneil Budge, MD;  Location: [facility] ORS;  Service: General;  Laterality: N/A;   FL INJ LEFT KNEE CT ARTHROGRAM ([facility] HX)     TONSILLECTOMY      Social History   Socioeconomic History   Marital status: Widowed    Spouse name: Not on file   Number of children: 2   Years of education: college   Highest education level: Associate degree: academic program  Occupational History   Occupation: Retired [employer]  Tobacco Use   Smoking status: Never    Passive exposure: Never   Smokeless tobacco: Never  Vaping Use   Vaping status: Never Used  Substance and Sexual Activity   Alcohol use: Yes    Alcohol/week: 1.0 standard drink of alcohol    Types: 1 Glasses of wine per week    Comment: occasional glass of wine    Drug use: No   Sexual activity: Never  Other Topics Concern   Not on file  Social History Narrative   Lives alone.   Right-handed.   Two children - 1 biological, 1 adopted.   Occasional use of caffeine.   Social Drivers of Corporate Investment Banker Strain: Low Risk  (11/26/2023)   Overall Financial Resource Strain (CARDIA)    Difficulty of Paying Living Expenses: Not very hard  Food Insecurity: Low Risk  (12/02/2023)   Received from [facility]   Hunger Vital Sign    Within the past 12 months, you worried that your food would run out before you got money to buy more: Never true    Within the past 12 months, the food you bought just didn't last and you didn't have money to get more. : Never true  Transportation Needs: No Transportation Needs (12/02/2023)   [facility]   Transportation    In the past 12 months, has lack of reliable transportation kept you from medical appointments, meetings, work or from getting things needed for daily living? : No  Physical Activity: Inactive (11/26/2023)   Exercise Vital Sign    Days of Exercise per Week: 0 days    Minutes of Exercise per Session: Not on file  Stress: Stress Concern Present (11/26/2023)   [employer] of Occupational Health - Occupational Stress Questionnaire    Feeling of Stress: To some extent  Social Connections: Moderately Isolated (11/26/2023)   Social Connection and Isolation Panel    Frequency of Communication with Friends and Family: More than three times a week    Frequency  of Social Gatherings with Friends and Family: Once a week    Attends Religious Services: More than 4 times per year    Active Member of [employer] or Organizations: No    Attends Banker Meetings: Not on file    Marital Status: Widowed  Intimate Partner Violence: Not At Risk (07/07/2022)   Humiliation, Afraid, Rape, and Kick questionnaire    Fear of Current or Ex-Partner: No    Emotionally Abused: No    Physically  Abused: No    Sexually Abused: No     Allergies  Allergen Reactions   Penicillins Anaphylaxis    Has patient had a PCN reaction causing immediate rash, facial/tongue/throat swelling, SOB or lightheadedness with hypotension: Yes Has patient had a PCN reaction causing severe rash involving mucus membranes or skin necrosis: No Has patient had a PCN reaction that required hospitalization Yes Has patient had a PCN reaction occurring within the last 10 years: Yes If all of the above answers are NO, then may proceed with Cephalosporin use.    Cephalosporins Other (See Comments)    Due to allergic reaction to penicillin   Estrogens    Prochlorperazine Edisylate     REACTION: draws mouth to side     CBC    Component Value Date/Time   WBC 7.8 12/18/2023 1158   RBC 4.64 12/18/2023 1158   HGB 13.6 12/18/2023 1158   HGB 14.6 12/05/2022 1516   HCT 41.6 12/18/2023 1158   HCT 44.5 12/05/2022 1516   PLT 215.0 12/18/2023 1158   PLT 234 12/05/2022 1516   MCV 89.6 12/18/2023 1158   MCV 89 12/05/2022 1516   MCH 29.1 11/27/2023 1621   MCHC 32.8 12/18/2023 1158   RDW 15.0 12/18/2023 1158   RDW 13.7 12/05/2022 1516   LYMPHSABS 1.5 12/18/2023 1158   MONOABS 0.5 12/18/2023 1158   EOSABS 0.1 12/18/2023 1158   BASOSABS 0.0 12/18/2023 1158    Pulmonary Functions Testing Results:    Latest Ref Rng & Units 10/08/2023    9:57 AM  PFT Results  FVC-Pre L 2.00   FVC-Predicted Pre % 70   FVC-Post L 2.05   FVC-Predicted Post % 71   Pre FEV1/FVC % % 75   Post FEV1/FCV % % 75   FEV1-Pre L 1.50   FEV1-Predicted Pre % 70   FEV1-Post L 1.54   DLCO uncorrected ml/min/mmHg 17.76   DLCO UNC% % 89   DLVA Predicted % 114   TLC L 5.61   TLC % Predicted % 104   RV % Predicted % 139     Outpatient Medications Prior to Visit  Medication Sig Dispense Refill   acetaminophen  (TYLENOL ) 500 MG tablet Take 1,500 mg by mouth daily as needed for moderate pain.     ALPRAZolam  (XANAX ) 0.5 MG tablet TAKE 1  TABLET(0.5 MG) BY MOUTH TWICE DAILY AS NEEDED FOR ANXIETY 60 tablet 1   diltiazem  (CARDIZEM  CD) 120 MG 24 hr capsule Take 1 capsule (120 mg total) by mouth daily. 90 capsule 3   ELIQUIS  5 MG TABS tablet TAKE 1 TABLET(5 MG) BY MOUTH TWICE DAILY 60 tablet 11   melatonin 5 MG TABS Take 5 mg by mouth at bedtime.     mirtazapine  (REMERON ) 30 MG tablet TAKE 1 TABLET(30 MG) BY MOUTH AT BEDTIME 30 tablet 1   Omega-3 Fatty Acids (FISH OIL) 1000 MG CAPS Take 2,000 mg by mouth at bedtime.     propranolol  (INDERAL ) 40 MG tablet Take 1  tablet (40 mg total) by mouth 2 (two) times daily. 180 tablet 3   fluticasone -salmeterol (WIXELA INHUB) 100-50 MCG/ACT AEPB Inhale 1 puff into the lungs 2 (two) times daily. 60 each 12   furosemide  (LASIX ) 40 MG tablet Take 1 tablet (40 mg total) by mouth every other day. (Patient not taking: Reported on 05/12/2024) 90 tablet 3   potassium chloride  SA (KLOR-CON  M) 20 MEQ tablet Take 1 tablet (20 mEq total) by mouth every other day. (Patient not taking: Reported on 05/12/2024) 90 tablet 3   No facility-administered medications prior to visit.

## 2024-05-12 NOTE — Patient Instructions (Signed)
  VISIT SUMMARY: Today, we discussed your ongoing shortness of breath and reviewed your recent CT scan results. Your pulmonary nodules remain stable, and your previous pneumonia has resolved. We also talked about your chronic shortness of breath and potential ways to manage it better.  YOUR PLAN: -STABLE PULMONARY NODULES: Pulmonary nodules are small growths in the lungs. Your recent CT scan shows that these nodules are stable, and your previous pneumonia has resolved. We will repeat the CT scan in one year to ensure there are no changes. If the scan next year shows no changes, no further imaging will be needed.  -CHRONIC DYSPNEA: Chronic dyspnea means ongoing shortness of breath. Despite using Wixela, your symptoms persist. We recommend continuing Wixela and washing your mouth after each use. Increasing physical activity and losing weight, especially around the abdomen, may help alleviate your symptoms. You may try reducing Wixela to one puff daily and monitor your symptoms. If your symptoms worsen, return to one puff twice daily. Your Wixela prescription has been refilled at [facility] in [place] on [address].  INSTRUCTIONS: Please schedule a repeat CT scan in one year at [facility]. Continue using Wixela as directed and monitor your symptoms. If you notice any changes or worsening of your symptoms, please contact our office.     Contains text generated by [employer].

## 2024-05-26 ENCOUNTER — Other Ambulatory Visit: Payer: Self-pay | Admitting: Medical

## 2024-06-24 ENCOUNTER — Telehealth: Payer: Self-pay

## 2024-06-24 ENCOUNTER — Other Ambulatory Visit: Payer: Self-pay | Admitting: Medical

## 2024-06-24 NOTE — Telephone Encounter (Signed)
 Copied from CRM [id number]. Topic: Clinical - Medication Question >> Jun 24, 2024  4:22 PM Hadassah PARAS wrote: Reason for CRM: [facility] from [employer] is advising pt has a benefit of receiving 100 day supply on all medications. Pt has requested to have this moving forward.

## 2024-06-25 ENCOUNTER — Telehealth: Payer: Self-pay

## 2024-06-25 ENCOUNTER — Encounter: Payer: Self-pay | Admitting: Medical

## 2024-06-25 NOTE — Telephone Encounter (Signed)
 Rx refilled. She need controlled med visit scheduled for February.

## 2024-06-25 NOTE — Telephone Encounter (Signed)
 Called pt to notify her that she is due for a controlled med visit and its about time to update her contract and uds got her scheduled for 06/30/2024

## 2024-06-30 ENCOUNTER — Ambulatory Visit: Admitting: Medical

## 2024-06-30 VITALS — BP 138/78 | HR 90 | Temp 97.9°F | Resp 16 | Ht 66.0 in | Wt 234.6 lb

## 2024-06-30 DIAGNOSIS — Z79899 Other long term (current) drug therapy: Secondary | ICD-10-CM

## 2024-06-30 DIAGNOSIS — R0609 Other forms of dyspnea: Secondary | ICD-10-CM

## 2024-06-30 DIAGNOSIS — N200 Calculus of kidney: Secondary | ICD-10-CM

## 2024-06-30 DIAGNOSIS — L570 Actinic keratosis: Secondary | ICD-10-CM | POA: Diagnosis not present

## 2024-06-30 DIAGNOSIS — Z87440 Personal history of urinary (tract) infections: Secondary | ICD-10-CM

## 2024-06-30 DIAGNOSIS — H2510 Age-related nuclear cataract, unspecified eye: Secondary | ICD-10-CM

## 2024-06-30 DIAGNOSIS — R911 Solitary pulmonary nodule: Secondary | ICD-10-CM

## 2024-06-30 DIAGNOSIS — I4891 Unspecified atrial fibrillation: Secondary | ICD-10-CM

## 2024-06-30 DIAGNOSIS — L853 Xerosis cutis: Secondary | ICD-10-CM | POA: Diagnosis not present

## 2024-06-30 DIAGNOSIS — F411 Generalized anxiety disorder: Secondary | ICD-10-CM

## 2024-06-30 DIAGNOSIS — R630 Anorexia: Secondary | ICD-10-CM

## 2024-06-30 MED ORDER — AMMONIUM LACTATE 12 % EX LOTN: 1.0000 | TOPICAL_LOTION | CUTANEOUS | 0 refills | Status: AC | PRN

## 2024-06-30 NOTE — Progress Notes (Signed)
 "  Subjective:    Patient ID: [name] Brown, female    DOB: January 24, 1941, 84 y.o.   MRN: [id number]  HPI [name] Brown is an 84 year old female who presents for a controlled medication visit for Xanax .  She has taken Xanax  for anxiety for about 30 years. Her most recent prescription for 60 tablets was filled in November and lasted almost two months, consistent with use about once or twice daily. She has a pending refill at the pharmacy. She rarely drinks alcohol, usually a single glass of wine once a month.  She has intermittent intensely pruritic rash on the lower legs that began about one year ago, with flares lasting four to six weeks. The last flare was in November. Moisturizers, high-strength cortisone cream, and Benadryl have not helped.Skin feels dry and flakes.     Review of Systems  Constitutional:  Negative for chills and fatigue.  HENT:  Negative for congestion.   Respiratory:  Negative for cough, shortness of breath and wheezing.   Cardiovascular:  Negative for chest pain and palpitations.  Gastrointestinal:  Negative for abdominal pain.  Genitourinary:  Negative for dysuria.  Musculoskeletal:  Negative for back pain and joint swelling.  Skin:  Negative for rash.  Neurological:  Negative for dizziness, weakness and light-headedness.  Hematological:  Negative for adenopathy.  Psychiatric/Behavioral:  Negative for behavioral problems, dysphoric mood and suicidal ideas. The patient is nervous/anxious.     Past Medical History:  Diagnosis Date   A-fib [facility])    Actinic keratosis 01/15/2007   Centricity Description: ACTINIC KERATOSIS  Qualifier: Diagnosis of   By: Waylan ROSALEA Pao      Centricity Description: SOLAR KERATOSIS  Qualifier: Diagnosis of   By: Waylan ROSALEA Pao       ACUTE BRONCHITIS 06/27/2010   Qualifier: Diagnosis of   By: Waylan ROSALEA Pao       Anxiety state 03/20/2006   Qualifier: Diagnosis of   By: Waylan DO, Karen       Cardiac murmur 12/05/2022   Chronic  anticoagulation 03/20/2006   Qualifier: Diagnosis of   By: Waylan ROSALEA Pao       Depression    DYSPNEA 04/23/2009   Qualifier: Diagnosis of   By: Gwenn Grimes       Essential tremor    GERD 04/23/2009   Qualifier: Diagnosis of   By: Gwenn Grimes       Hyperlipidemia    INSOMNIA, CHRONIC 07/08/2007   Qualifier: Diagnosis of   By: Waylan DO, Karen       Major depressive disorder, recurrent episode 03/20/2006   Qualifier: Diagnosis of   By: Waylan ROSALEA Pao       Memory loss 08/09/2017   OBESITY, NOS 03/20/2006   Qualifier: Diagnosis of   By: Waylan DO, Karen       Persistent atrial fibrillation (HCC) 01/06/2021   Secondary hypercoagulable state 01/06/2021   Transient cerebral ischemia 07/21/2008   Qualifier: Diagnosis of   By: Waylan DO, Karen       Transient global amnesia 08/09/2017   Tremor 08/09/2017   UTI 04/28/2009   Qualifier: Diagnosis of   By: Waylan ROSALEA Pao         Social History   Socioeconomic History   Marital status: Widowed    Spouse name: Not on file   Number of children: 2   Years of education: college   Highest education level: Associate degree: academic program  Occupational History   Occupation:  Retired [employer]  Tobacco Use   Smoking status: Never    Passive exposure: Never   Smokeless tobacco: Never  Vaping Use   Vaping status: Never Used  Substance and Sexual Activity   Alcohol use: Yes    Alcohol/week: 1.0 standard drink of alcohol    Types: 1 Glasses of wine per week    Comment: occasional glass of wine   Drug use: No   Sexual activity: Never  Other Topics Concern   Not on file  Social History Narrative   Lives alone.   Right-handed.   Two children - 1 biological, 1 adopted.   Occasional use of caffeine.   Social Drivers of Health   Tobacco Use: Low Risk (05/12/2024)   Patient History    Smoking Tobacco Use: Never    Smokeless Tobacco Use: Never    Passive Exposure: Never  Financial Resource Strain: Low Risk (06/25/2024)   Overall  Financial Resource Strain (CARDIA)    Difficulty of Paying Living Expenses: Not very hard  Food Insecurity: Food Insecurity Present (06/25/2024)   Epic    Worried About Programme Researcher, Broadcasting/film/video in the Last Year: Never true    Ran Out of Food in the Last Year: Sometimes true  Transportation Needs: Unmet Transportation Needs (06/25/2024)   Epic    Lack of Transportation (Medical): Yes    Lack of Transportation (Non-Medical): Yes  Physical Activity: Insufficiently Active (06/25/2024)   Exercise Vital Sign    Days of Exercise per Week: 2 days    Minutes of Exercise per Session: 10 min  Stress: No Stress Concern Present (06/25/2024)   [employer] of Occupational Health - Occupational Stress Questionnaire    Feeling of Stress: Only a little  Social Connections: Moderately Integrated (06/25/2024)   Social Connection and Isolation Panel    Frequency of Communication with Friends and Family: More than three times a week    Frequency of Social Gatherings with Friends and Family: Once a week    Attends Religious Services: More than 4 times per year    Active Member of [employer] or Organizations: Yes    Attends Banker Meetings: More than 4 times per year    Marital Status: Widowed  Intimate Partner Violence: Not At Risk (07/07/2022)   Humiliation, Afraid, Rape, and Kick questionnaire    Fear of Current or Ex-Partner: No    Emotionally Abused: No    Physically Abused: No    Sexually Abused: No  Depression (PHQ2-9): Medium Risk (06/30/2024)   Depression (PHQ2-9)    PHQ-2 Score: 9  Alcohol Screen: Low Risk (06/25/2024)   Alcohol Screen    Last Alcohol Screening Score (AUDIT): 1  Housing: Unknown (06/25/2024)   Epic    Unable to Pay for Housing in the Last Year: No    Number of Times Moved in the Last Year: Not on file    Homeless in the Last Year: No  Utilities: Low Risk (12/02/2023)   Received from [facility]   Utilities    In the past 12 months has the electric, gas, oil, or water  company threatened to shut off services in your home? : No  Health Literacy: Not on file    Past Surgical History:  Procedure Laterality Date   ABDOMINAL HYSTERECTOMY     APPENDECTOMY     breast biopsies Bilateral    CARDIOVERSION N/A 02/15/2021   Procedure: CARDIOVERSION;  Surgeon: Kate Lonni CROME, MD;  Location: [facility] ENDOSCOPY;  Service: Cardiovascular;  Laterality: N/A;   CYST EXCISION N/A 07/10/2016   Procedure: 3CM CYST EXCISION TRUNK;  Surgeon: Oneil Budge, MD;  Location: [facility] ORS;  Service: General;  Laterality: N/A;   FL INJ LEFT KNEE CT ARTHROGRAM ([facility] HX)     TONSILLECTOMY      Family History  Problem Relation Age of Onset   Atrial fibrillation Mother    COPD Mother    Atrial fibrillation Father    Lung cancer Father    Atrial fibrillation Sister     Allergies[1]  Medications Ordered Prior to Encounter[2]  BP 138/78   Pulse 90   Temp 97.9 F (36.6 C) (Oral)   Resp 16   Ht 5' 6 (1.676 m)   Wt 234 lb 9.6 oz (106.4 kg)   SpO2 99%   BMI 37.87 kg/m     Objective:   Physical Exam  General- No acute distress. Pleasant patient. Neck- Full range of motion, no jvd Lungs- Clear, even and unlabored. Heart- regular rate and rhythm. Neurologic- CNII- XII grossly intact.  Lower ext- calfs symmetric, negative homans signs. No edema. Distal 1/3 calfs dry and skin flaky     Assessment & Plan:  Generalized anxiety disorder with long-term benzodiazepine use Long-term Xanax  use for 29-30 years. Pharmacy/insurance form advised caution against use for her age. Discussed risks of abrupt discontinuation, including seizures. Current low dosage. Advised against alcohol on Xanax  days. - Continue Xanax  as prescribed as risk of DC exceeds continuation as med started by former pcp. - Avoid alcohol on Xanax  days.  Dry skin Intermittent rash and itching on lower legs, lasting 4-6 weeks per episode. Previous treatments ineffective. - Prescribed LacHydrin twice daily for 5-7  days. - If LacHydrin not covered, use OTC Amlactin.  Ask you insurance company to send over paperwork that need to be filled out/new requirement for seeing specialist?  Follow up in 6 months or sooner if needed  Rochester Serpe, PA-C      [1]  Allergies Allergen Reactions   Penicillins Anaphylaxis    Has patient had a PCN reaction causing immediate rash, facial/tongue/throat swelling, SOB or lightheadedness with hypotension: Yes Has patient had a PCN reaction causing severe rash involving mucus membranes or skin necrosis: No Has patient had a PCN reaction that required hospitalization Yes Has patient had a PCN reaction occurring within the last 10 years: Yes If all of the above answers are NO, then may proceed with Cephalosporin use.    Cephalosporins Other (See Comments)    Due to allergic reaction to penicillin   Estrogens    Prochlorperazine Edisylate     REACTION: draws mouth to side  [2]  Current Outpatient Medications on File Prior to Visit  Medication Sig Dispense Refill   acetaminophen  (TYLENOL ) 500 MG tablet Take 1,500 mg by mouth daily as needed for moderate pain.     ALPRAZolam  (XANAX ) 0.5 MG tablet TAKE 1 TABLET(0.5 MG) BY MOUTH TWICE DAILY AS NEEDED FOR ANXIETY 60 tablet 0   diltiazem  (CARDIZEM  CD) 120 MG 24 hr capsule Take 1 capsule (120 mg total) by mouth daily. 90 capsule 3   ELIQUIS  5 MG TABS tablet TAKE 1 TABLET(5 MG) BY MOUTH TWICE DAILY 60 tablet 11   estradiol (ESTRACE) 0.01 % CREA vaginal cream Place 1 Applicatorful vaginally daily.     fluticasone -salmeterol (WIXELA INHUB) 100-50 MCG/ACT AEPB Inhale 1 puff into the lungs 2 (two) times daily. 60 each 12   melatonin 5 MG TABS Take 5 mg by mouth at  bedtime.     mirtazapine  (REMERON ) 30 MG tablet TAKE 1 TABLET(30 MG) BY MOUTH AT BEDTIME 30 tablet 1   Omega-3 Fatty Acids (FISH OIL) 1000 MG CAPS Take 2,000 mg by mouth at bedtime.     propranolol  (INDERAL ) 40 MG tablet Take 1 tablet (40 mg total) by mouth 2  (two) times daily. 180 tablet 3   furosemide  (LASIX ) 40 MG tablet Take 1 tablet (40 mg total) by mouth every other day. (Patient not taking: Reported on 06/30/2024) 90 tablet 3   potassium chloride  SA (KLOR-CON  M) 20 MEQ tablet Take 1 tablet (20 mEq total) by mouth every other day. (Patient not taking: Reported on 06/30/2024) 90 tablet 3   No current facility-administered medications on file prior to visit.   "

## 2024-06-30 NOTE — Patient Instructions (Signed)
 Generalized anxiety disorder with long-term benzodiazepine use Long-term Xanax  use for 29-30 years. Pharmacy/insurance form advised caution against use for her age. Discussed risks of abrupt discontinuation, including seizures. Current low dosage. Advised against alcohol on Xanax  days. - Continue Xanax  as prescribed as risk of DC exceeds continuation as med started by former pcp. - Avoid alcohol on Xanax  days.  Dry skin Intermittent rash and itching on lower legs, lasting 4-6 weeks per episode. Previous treatments ineffective. - Prescribed LacHydrin twice daily for 5-7 days. - If LacHydrin not covered, use OTC Amlactin.  Ask you insurance company to send over paperwork that need to be filled out/new requirement for seeing specialist?  Follow up in 6 months or sooner if needed

## 2024-07-02 ENCOUNTER — Ambulatory Visit: Payer: Self-pay | Admitting: Medical

## 2024-07-02 LAB — DRUG MONITORING PANEL 376104, URINE
Alphahydroxyalprazolam: 264 ng/mL — ABNORMAL HIGH
Alphahydroxymidazolam: NEGATIVE ng/mL
Alphahydroxytriazolam: NEGATIVE ng/mL
Aminoclonazepam: NEGATIVE ng/mL
Amphetamines: NEGATIVE ng/mL
Barbiturates: NEGATIVE ng/mL
Benzodiazepines: POSITIVE ng/mL — AB
Cocaine Metabolite: NEGATIVE ng/mL
Desmethyltramadol: NEGATIVE ng/mL
Hydroxyethylflurazepam: NEGATIVE ng/mL
Lorazepam: NEGATIVE ng/mL
Nordiazepam: NEGATIVE ng/mL
Opiates: NEGATIVE ng/mL
Oxazepam: NEGATIVE ng/mL
Oxycodone: NEGATIVE ng/mL
Temazepam: NEGATIVE ng/mL
Tramadol: NEGATIVE ng/mL

## 2024-07-02 LAB — DM TEMPLATE

## 2024-07-04 ENCOUNTER — Telehealth: Payer: Self-pay | Admitting: Medical

## 2024-07-04 NOTE — Telephone Encounter (Signed)
 Copied from CRM #[id number]. Topic: Referral - Status >> Jul 04, 2024 11:10 AM Emylou G wrote: Reason for CRM: KJ w/[employer] member services called.SABRA adv they need referral uploaded to [facility] for providers.. pls call the providers line if you don't know how [phone].. This is a new requirement.. the referral was for neurologist, pulmonogist, cardiologist... she plans to see them this year.. see mychart recent msgs.SABRA

## 2024-07-06 ENCOUNTER — Encounter: Payer: Self-pay | Admitting: Medical

## 2024-07-06 NOTE — Addendum Note (Signed)
 Addended by: DORINA DALLAS DORINA PA-C M on: 07/06/2024 11:41 AM   Modules accepted: Orders

## 2024-07-07 ENCOUNTER — Telehealth: Payer: Self-pay | Admitting: Medical

## 2024-07-07 DIAGNOSIS — I4891 Unspecified atrial fibrillation: Secondary | ICD-10-CM

## 2024-07-07 NOTE — Telephone Encounter (Signed)
 Placed referral to cariologist

## 2024-07-08 ENCOUNTER — Encounter: Payer: Self-pay | Admitting: Medical

## 2024-07-09 NOTE — Addendum Note (Signed)
 Addended by: DORINA DALLAS DORINA PA-C M on: 07/09/2024 04:56 PM   Modules accepted: Orders

## 2024-07-11 ENCOUNTER — Ambulatory Visit: Admitting: *Deleted

## 2024-07-11 VITALS — Ht 66.0 in | Wt 234.0 lb

## 2024-07-11 DIAGNOSIS — Z0000 Encounter for general adult medical examination without abnormal findings: Secondary | ICD-10-CM | POA: Diagnosis not present

## 2024-07-11 NOTE — Patient Instructions (Addendum)
 [name] Brown,  Thank you for taking the time for your Medicare Wellness Visit. I appreciate your continued commitment to your health goals. Please review the care plan we discussed, and feel free to reach out if I can assist you further.  Please note that Annual Wellness Visits do not include a physical exam. Some assessments may be limited, especially if the visit was conducted virtually. If needed, we may recommend an in-person follow-up with your provider.  Goals: To be able to stay self-reliant   Ongoing Care Seeing your primary care provider every 3 to 6 months helps us  monitor your health and provide consistent, personalized care.   Dallas Maxwell, PA-C: 12/29/24 10:20am Medicare AWV:  07/13/25 10:20am, in person  Referrals If a referral was made during today's visit and you haven't received any updates within two weeks, please contact the referred provider directly to check on the status.  Recommended Screenings: You will need to get the following vaccines at your local pharmacy: Tetanus, Shingles  Health Maintenance  Topic Date Due   Complete foot exam   Never done   Eye exam for diabetics  Never done   Zoster (Shingles) Vaccine (1 of 2) Never done   DTaP/Tdap/Td vaccine (2 - Tdap) 09/27/2015   COVID-19 Vaccine (3 - Moderna risk series) 03/06/2020   Medicare Annual Wellness Visit  07/08/2023   Flu Shot  01/11/2024   Hemoglobin A1C  07/02/2024   Pneumococcal Vaccine for age over 13  Completed   Osteoporosis screening with Bone Density Scan  Completed   Meningitis B Vaccine  Aged Out   Colon Cancer Screening  Discontinued       07/11/2024    9:36 AM  Advanced Directives  Does Patient Have a Medical Advance Directive? Yes  Type of Estate Agent of [place];Living will  Does patient want to make changes to medical advance directive? No - Patient declined  Copy of Healthcare Power of Attorney in Chart? No - copy requested  Once completed and notarized,  you may return a copy of your Advanced Directive(s) by either of the following:  Bring a copy of your health care power of attorney and living will to the office to be added to your chart at your convenience. You can mail a copy to [facility] [address] [place], [place] [address] or email to [email or web address]   Vision: Annual vision screenings are recommended for early detection of glaucoma, cataracts, and diabetic retinopathy. These exams can also reveal signs of chronic conditions such as diabetes and high blood pressure.  Dental: Annual dental screenings help detect early signs of oral cancer, gum disease, and other conditions linked to overall health, including heart disease and diabetes.  Please see the attached documents for additional preventive care recommendations.

## 2024-07-15 ENCOUNTER — Telehealth: Payer: Self-pay | Admitting: *Deleted

## 2024-07-15 NOTE — Telephone Encounter (Signed)
 Pt had AWV last week. She reported having a loss of appetite for 1 year. She states she only eats 1 meal a day and had been supplementing with Ensure. She had to stop this due to the cost. Pt is interested in applying for the [employer] Nutrition pt Assistance program for the Ensure. Form has been placed for PCP to complete / sign in green folder if he feels appropriate for pt. Please see highlighted areas on first form that need Calorie needs and ICD code. Last page needs PCP signature.

## 2024-07-17 NOTE — Telephone Encounter (Signed)
 Copy made for scanning. Pt notified form is ready for pick up at the front desk.

## 2024-08-21 ENCOUNTER — Ambulatory Visit: Admitting: Internal Medicine

## 2024-12-29 ENCOUNTER — Ambulatory Visit: Admitting: Medical

## 2025-07-13 ENCOUNTER — Ambulatory Visit
